# Patient Record
Sex: Female | Born: 1970 | Race: White | Hispanic: No | Marital: Single | Smoking: Current every day smoker
Health system: Southern US, Community
[De-identification: ages and names within clinical notes are randomized; demographics above are authoritative.]

## PROBLEM LIST (undated history)

## (undated) DIAGNOSIS — F319 Bipolar disorder, unspecified: Secondary | ICD-10-CM

## (undated) DIAGNOSIS — R011 Cardiac murmur, unspecified: Secondary | ICD-10-CM

## (undated) DIAGNOSIS — M545 Low back pain, unspecified: Secondary | ICD-10-CM

## (undated) DIAGNOSIS — E785 Hyperlipidemia, unspecified: Secondary | ICD-10-CM

## (undated) DIAGNOSIS — F419 Anxiety disorder, unspecified: Secondary | ICD-10-CM

## (undated) DIAGNOSIS — J189 Pneumonia, unspecified organism: Secondary | ICD-10-CM

## (undated) DIAGNOSIS — G8929 Other chronic pain: Secondary | ICD-10-CM

## (undated) DIAGNOSIS — T7840XA Allergy, unspecified, initial encounter: Secondary | ICD-10-CM

## (undated) DIAGNOSIS — J45909 Unspecified asthma, uncomplicated: Secondary | ICD-10-CM

## (undated) DIAGNOSIS — D649 Anemia, unspecified: Secondary | ICD-10-CM

## (undated) DIAGNOSIS — R002 Palpitations: Secondary | ICD-10-CM

## (undated) DIAGNOSIS — I10 Essential (primary) hypertension: Secondary | ICD-10-CM

## (undated) DIAGNOSIS — H269 Unspecified cataract: Secondary | ICD-10-CM

## (undated) DIAGNOSIS — F32A Depression, unspecified: Secondary | ICD-10-CM

## (undated) DIAGNOSIS — M797 Fibromyalgia: Secondary | ICD-10-CM

## (undated) DIAGNOSIS — K219 Gastro-esophageal reflux disease without esophagitis: Secondary | ICD-10-CM

## (undated) DIAGNOSIS — R519 Headache, unspecified: Secondary | ICD-10-CM

## (undated) DIAGNOSIS — N6310 Unspecified lump in the right breast, unspecified quadrant: Secondary | ICD-10-CM

## (undated) DIAGNOSIS — M5410 Radiculopathy, site unspecified: Secondary | ICD-10-CM

## (undated) DIAGNOSIS — M1990 Unspecified osteoarthritis, unspecified site: Secondary | ICD-10-CM

## (undated) DIAGNOSIS — M47812 Spondylosis without myelopathy or radiculopathy, cervical region: Secondary | ICD-10-CM

## (undated) DIAGNOSIS — F329 Major depressive disorder, single episode, unspecified: Secondary | ICD-10-CM

## (undated) DIAGNOSIS — IMO0001 Reserved for inherently not codable concepts without codable children: Secondary | ICD-10-CM

## (undated) DIAGNOSIS — G43909 Migraine, unspecified, not intractable, without status migrainosus: Secondary | ICD-10-CM

## (undated) DIAGNOSIS — R51 Headache: Secondary | ICD-10-CM

## (undated) HISTORY — DX: Bipolar disorder, unspecified: F31.9

## (undated) HISTORY — DX: Hyperlipidemia, unspecified: E78.5

## (undated) HISTORY — PX: NASAL SINUS SURGERY: SHX719

## (undated) HISTORY — DX: Gastro-esophageal reflux disease without esophagitis: K21.9

## (undated) HISTORY — DX: Essential (primary) hypertension: I10

## (undated) HISTORY — PX: UPPER GASTROINTESTINAL ENDOSCOPY: SHX188

## (undated) HISTORY — DX: Migraine, unspecified, not intractable, without status migrainosus: G43.909

## (undated) HISTORY — PX: COLONOSCOPY: SHX174

## (undated) HISTORY — DX: Depression, unspecified: F32.A

## (undated) HISTORY — PX: ABDOMINAL HYSTERECTOMY: SHX81

## (undated) HISTORY — PX: EYE SURGERY: SHX253

## (undated) HISTORY — DX: Reserved for inherently not codable concepts without codable children: IMO0001

## (undated) HISTORY — DX: Anxiety disorder, unspecified: F41.9

## (undated) HISTORY — DX: Unspecified osteoarthritis, unspecified site: M19.90

## (undated) HISTORY — DX: Anemia, unspecified: D64.9

## (undated) HISTORY — DX: Headache, unspecified: R51.9

## (undated) HISTORY — DX: Major depressive disorder, single episode, unspecified: F32.9

## (undated) HISTORY — PX: DENTAL RESTORATION/EXTRACTION WITH X-RAY: SHX5796

## (undated) HISTORY — DX: Allergy, unspecified, initial encounter: T78.40XA

## (undated) HISTORY — PX: OTHER SURGICAL HISTORY: SHX169

## (undated) HISTORY — DX: Low back pain: M54.5

## (undated) HISTORY — DX: Unspecified cataract: H26.9

## (undated) HISTORY — DX: Fibromyalgia: M79.7

## (undated) HISTORY — DX: Other chronic pain: G89.29

## (undated) HISTORY — DX: Radiculopathy, site unspecified: M54.10

## (undated) HISTORY — DX: Headache: R51

## (undated) HISTORY — DX: Spondylosis without myelopathy or radiculopathy, cervical region: M47.812

## (undated) HISTORY — DX: Low back pain, unspecified: M54.50

---

## 1999-04-10 ENCOUNTER — Encounter: Admission: RE | Admit: 1999-04-10 | Discharge: 1999-04-10 | Payer: Self-pay

## 1999-06-27 ENCOUNTER — Encounter (INDEPENDENT_AMBULATORY_CARE_PROVIDER_SITE_OTHER): Payer: Self-pay | Admitting: Specialist

## 1999-06-27 ENCOUNTER — Other Ambulatory Visit: Admission: RE | Admit: 1999-06-27 | Discharge: 1999-06-27 | Payer: Self-pay | Admitting: *Deleted

## 2000-12-17 ENCOUNTER — Encounter: Payer: Self-pay | Admitting: Emergency Medicine

## 2000-12-17 ENCOUNTER — Encounter: Payer: Self-pay | Admitting: Gastroenterology

## 2000-12-17 ENCOUNTER — Emergency Department (HOSPITAL_COMMUNITY): Admission: EM | Admit: 2000-12-17 | Discharge: 2000-12-17 | Payer: Self-pay | Admitting: Emergency Medicine

## 2000-12-24 ENCOUNTER — Ambulatory Visit (HOSPITAL_COMMUNITY): Admission: RE | Admit: 2000-12-24 | Discharge: 2000-12-24 | Payer: Self-pay | Admitting: *Deleted

## 2000-12-25 ENCOUNTER — Encounter: Payer: Self-pay | Admitting: *Deleted

## 2002-10-06 ENCOUNTER — Emergency Department (HOSPITAL_COMMUNITY): Admission: EM | Admit: 2002-10-06 | Discharge: 2002-10-06 | Payer: Self-pay | Admitting: Emergency Medicine

## 2002-11-25 ENCOUNTER — Encounter: Admission: RE | Admit: 2002-11-25 | Discharge: 2003-02-23 | Payer: Self-pay | Admitting: Radiology

## 2003-01-04 ENCOUNTER — Encounter
Admission: RE | Admit: 2003-01-04 | Discharge: 2003-02-09 | Payer: Self-pay | Admitting: Physical Medicine & Rehabilitation

## 2003-03-17 ENCOUNTER — Encounter
Admission: RE | Admit: 2003-03-17 | Discharge: 2003-06-15 | Payer: Self-pay | Admitting: Physical Medicine & Rehabilitation

## 2003-04-15 ENCOUNTER — Encounter: Payer: Self-pay | Admitting: Gastroenterology

## 2003-05-03 ENCOUNTER — Encounter: Payer: Self-pay | Admitting: Gastroenterology

## 2003-05-03 ENCOUNTER — Ambulatory Visit (HOSPITAL_COMMUNITY): Admission: RE | Admit: 2003-05-03 | Discharge: 2003-05-03 | Payer: Self-pay | Admitting: Gastroenterology

## 2003-05-03 ENCOUNTER — Encounter (INDEPENDENT_AMBULATORY_CARE_PROVIDER_SITE_OTHER): Payer: Self-pay

## 2003-05-17 ENCOUNTER — Encounter: Payer: Self-pay | Admitting: Gastroenterology

## 2003-05-17 ENCOUNTER — Ambulatory Visit (HOSPITAL_COMMUNITY): Admission: RE | Admit: 2003-05-17 | Discharge: 2003-05-17 | Payer: Self-pay | Admitting: Gastroenterology

## 2003-06-08 ENCOUNTER — Encounter: Payer: Self-pay | Admitting: Gastroenterology

## 2003-08-31 ENCOUNTER — Encounter
Admission: RE | Admit: 2003-08-31 | Discharge: 2003-11-29 | Payer: Self-pay | Admitting: Physical Medicine & Rehabilitation

## 2004-10-01 ENCOUNTER — Ambulatory Visit: Payer: Self-pay | Admitting: Gastroenterology

## 2005-03-19 ENCOUNTER — Ambulatory Visit: Payer: Self-pay | Admitting: Gastroenterology

## 2005-08-20 ENCOUNTER — Emergency Department (HOSPITAL_COMMUNITY): Admission: EM | Admit: 2005-08-20 | Discharge: 2005-08-20 | Payer: Self-pay | Admitting: Emergency Medicine

## 2006-04-22 ENCOUNTER — Ambulatory Visit: Payer: Self-pay | Admitting: Gastroenterology

## 2006-04-24 ENCOUNTER — Encounter (INDEPENDENT_AMBULATORY_CARE_PROVIDER_SITE_OTHER): Payer: Self-pay | Admitting: *Deleted

## 2006-04-24 ENCOUNTER — Ambulatory Visit: Payer: Self-pay | Admitting: Gastroenterology

## 2008-01-06 ENCOUNTER — Telehealth: Payer: Self-pay | Admitting: Gastroenterology

## 2008-01-06 ENCOUNTER — Ambulatory Visit: Payer: Self-pay | Admitting: Gastroenterology

## 2008-01-06 DIAGNOSIS — K5900 Constipation, unspecified: Secondary | ICD-10-CM | POA: Insufficient documentation

## 2008-01-06 DIAGNOSIS — K219 Gastro-esophageal reflux disease without esophagitis: Secondary | ICD-10-CM | POA: Insufficient documentation

## 2008-10-25 ENCOUNTER — Emergency Department (HOSPITAL_COMMUNITY): Admission: EM | Admit: 2008-10-25 | Discharge: 2008-10-26 | Payer: Self-pay | Admitting: Emergency Medicine

## 2008-11-03 ENCOUNTER — Ambulatory Visit (HOSPITAL_COMMUNITY): Admission: RE | Admit: 2008-11-03 | Discharge: 2008-11-03 | Payer: Self-pay | Admitting: General Practice

## 2009-01-16 ENCOUNTER — Encounter: Admission: RE | Admit: 2009-01-16 | Discharge: 2009-01-16 | Payer: Self-pay | Admitting: Neurology

## 2009-08-21 ENCOUNTER — Encounter: Payer: Self-pay | Admitting: Gastroenterology

## 2009-08-25 ENCOUNTER — Emergency Department (HOSPITAL_COMMUNITY): Admission: EM | Admit: 2009-08-25 | Discharge: 2009-08-25 | Payer: Self-pay | Admitting: Emergency Medicine

## 2010-01-16 ENCOUNTER — Encounter (INDEPENDENT_AMBULATORY_CARE_PROVIDER_SITE_OTHER): Payer: Self-pay | Admitting: *Deleted

## 2010-01-18 HISTORY — PX: TOTAL ABDOMINAL HYSTERECTOMY W/ BILATERAL SALPINGOOPHORECTOMY: SHX83

## 2010-01-23 ENCOUNTER — Encounter (INDEPENDENT_AMBULATORY_CARE_PROVIDER_SITE_OTHER): Payer: Self-pay | Admitting: *Deleted

## 2010-01-23 ENCOUNTER — Inpatient Hospital Stay (HOSPITAL_COMMUNITY): Admission: RE | Admit: 2010-01-23 | Discharge: 2010-01-25 | Payer: Self-pay | Admitting: Obstetrics and Gynecology

## 2010-01-23 ENCOUNTER — Encounter (INDEPENDENT_AMBULATORY_CARE_PROVIDER_SITE_OTHER): Payer: Self-pay | Admitting: Obstetrics and Gynecology

## 2010-01-24 ENCOUNTER — Encounter (INDEPENDENT_AMBULATORY_CARE_PROVIDER_SITE_OTHER): Payer: Self-pay | Admitting: *Deleted

## 2010-01-26 ENCOUNTER — Encounter (INDEPENDENT_AMBULATORY_CARE_PROVIDER_SITE_OTHER): Payer: Self-pay | Admitting: *Deleted

## 2010-03-12 ENCOUNTER — Encounter: Payer: Self-pay | Admitting: Gastroenterology

## 2010-04-18 ENCOUNTER — Ambulatory Visit: Payer: Self-pay | Admitting: Gastroenterology

## 2010-04-18 DIAGNOSIS — D649 Anemia, unspecified: Secondary | ICD-10-CM

## 2010-04-18 DIAGNOSIS — D509 Iron deficiency anemia, unspecified: Secondary | ICD-10-CM | POA: Insufficient documentation

## 2010-04-18 DIAGNOSIS — K921 Melena: Secondary | ICD-10-CM

## 2010-04-18 LAB — CONVERTED CEMR LAB
Basophils Absolute: 0.1 10*3/uL (ref 0.0–0.1)
Eosinophils Absolute: 0 10*3/uL (ref 0.0–0.7)
Folate: 9 ng/mL
Transferrin: 350.9 mg/dL (ref 212.0–360.0)
Vitamin B-12: >1500 pg/mL — ABNORMAL HIGH (ref 211–911)
WBC: 9.2 10*3/uL (ref 4.5–10.5)

## 2010-04-23 ENCOUNTER — Encounter (HOSPITAL_COMMUNITY)
Admission: RE | Admit: 2010-04-23 | Discharge: 2010-05-18 | Payer: Self-pay | Source: Home / Self Care | Attending: Gastroenterology | Admitting: Gastroenterology

## 2010-04-30 ENCOUNTER — Encounter: Payer: Self-pay | Admitting: Gastroenterology

## 2010-06-05 ENCOUNTER — Ambulatory Visit
Admission: RE | Admit: 2010-06-05 | Discharge: 2010-06-05 | Payer: Self-pay | Source: Home / Self Care | Attending: Gastroenterology | Admitting: Gastroenterology

## 2010-06-05 ENCOUNTER — Other Ambulatory Visit: Payer: Self-pay | Admitting: Gastroenterology

## 2010-06-05 LAB — CBC WITH DIFFERENTIAL/PLATELET
Basophils Absolute: 0 10*3/uL (ref 0.0–0.1)
Basophils Relative: 0.6 % (ref 0.0–3.0)
Eosinophils Absolute: 0.2 10*3/uL (ref 0.0–0.7)
Eosinophils Relative: 2.3 % (ref 0.0–5.0)
HCT: 35.2 % — ABNORMAL LOW (ref 36.0–46.0)
Hemoglobin: 12 g/dL (ref 12.0–15.0)
Lymphocytes Relative: 35.1 % (ref 12.0–46.0)
Lymphs Abs: 3.1 10*3/uL (ref 0.7–4.0)
MCHC: 34.2 g/dL (ref 30.0–36.0)
MCV: 95 fl (ref 78.0–100.0)
Monocytes Absolute: 0.5 10*3/uL (ref 0.1–1.0)
Monocytes Relative: 6 % (ref 3.0–12.0)
Neutro Abs: 4.9 10*3/uL (ref 1.4–7.7)
Neutrophils Relative %: 56 % (ref 43.0–77.0)
Platelets: 264 10*3/uL (ref 150.0–400.0)
RBC: 3.7 Mil/uL — ABNORMAL LOW (ref 3.87–5.11)
RDW: 25.2 % — ABNORMAL HIGH (ref 11.5–14.6)
WBC: 8.8 10*3/uL (ref 4.5–10.5)

## 2010-06-09 ENCOUNTER — Encounter: Payer: Self-pay | Admitting: Gastroenterology

## 2010-06-12 ENCOUNTER — Encounter: Payer: Self-pay | Admitting: Gastroenterology

## 2010-06-13 ENCOUNTER — Emergency Department (HOSPITAL_COMMUNITY)
Admission: EM | Admit: 2010-06-13 | Discharge: 2010-06-13 | Payer: Self-pay | Source: Home / Self Care | Admitting: Emergency Medicine

## 2010-06-19 ENCOUNTER — Encounter: Payer: Self-pay | Admitting: Gastroenterology

## 2010-06-19 NOTE — Letter (Signed)
Summary: [facility] Instructions  [facility]  [address] [place], [place] [address]   Phone: [phone]  Fax: [phone]       Connie Murray    [birth year]    MRN: [id number]        Procedure Day /Date: Tuesday January 17th, 2012     Arrival Time: 1:30pm     Procedure Time: 2:30pm     Location of Procedure:                    _ x_  [facility] (4th Floor)                        PREPARATION FOR COLONOSCOPY WITH MOVIPREP   Starting 5 days prior to your procedure1/12/12 do not eat nuts, seeds, popcorn, corn, beans, peas,  salads, or any raw vegetables.  Do not take any fiber supplements (e.g. Metamucil, Citrucel, and Benefiber).  THE DAY BEFORE YOUR PROCEDURE         DATE: 06/04/10  DAY: Monday  1.  Drink clear liquids the entire day-NO SOLID FOOD  2.  Do not drink anything colored red or purple.  Avoid juices with pulp.  No orange juice.  3.  Drink at least 64 oz. (8 glasses) of fluid/clear liquids during the day to prevent dehydration and help the prep work efficiently.  CLEAR LIQUIDS INCLUDE: Water Jello Ice Popsicles Tea (sugar ok, no milk/cream) Powdered fruit flavored drinks Coffee (sugar ok, no milk/cream) Gatorade Juice: apple, white grape, white cranberry  Lemonade Clear bullion, consomm, broth Carbonated beverages (any kind) Strained chicken noodle soup Hard Candy                             4.  In the morning, mix first dose of MoviPrep solution:    Empty 1 Pouch A and 1 Pouch B into the disposable container    Add lukewarm drinking water to the top line of the container. Mix to dissolve    Refrigerate (mixed solution should be used within 24 hrs)  5.  Begin drinking the prep at 5:00 p.m. The MoviPrep container is divided by 4 marks.   Every 15 minutes drink the solution down to the next mark (approximately 8 oz) until the full liter is complete.   6.  Follow completed prep with 16 oz of clear liquid of your choice  (Nothing red or purple).  Continue to drink clear liquids until bedtime.  7.  Before going to bed, mix second dose of MoviPrep solution:    Empty 1 Pouch A and 1 Pouch B into the disposable container    Add lukewarm drinking water to the top line of the container. Mix to dissolve    Refrigerate  THE DAY OF YOUR PROCEDURE      DATE: 06/05/10 DAY: Tuesday  Beginning at 9:30 a.m. (5 hours before procedure):         1. Every 15 minutes, drink the solution down to the next mark (approx 8 oz) until the full liter is complete.  2. Follow completed prep with 16 oz. of clear liquid of your choice.    3. You may drink clear liquids until 12:30pm (2 HOURS BEFORE PROCEDURE).   MEDICATION INSTRUCTIONS  Unless otherwise instructed, you should take regular prescription medications with a small sip of water   as early as possible the morning of your procedure.  OTHER INSTRUCTIONS  You will need a responsible adult at least 42 years of age to accompany you and drive you home.   This person must remain in the waiting room during your procedure.  Wear loose fitting clothing that is easily removed.  Leave jewelry and other valuables at home.  However, you may wish to bring a book to read or  an iPod/MP3 player to listen to music as you wait for your procedure to start.  Remove all body piercing jewelry and leave at home.  Total time from sign-in until discharge is approximately 2-3 hours.  You should go home directly after your procedure and rest.  You can resume normal activities the  day after your procedure.  The day of your procedure you should not:   Drive   Make legal decisions   Operate machinery   Drink alcohol   Return to work  You will receive specific instructions about eating, activities and medications before you leave.    The above instructions have been reviewed and explained to me by   Marchelle Folks.    I fully understand and can verbalize these instructions  _____________________________ Date _________

## 2010-06-19 NOTE — Discharge Summary (Signed)
Summary: Adenomyosis    NAME:  Connie Murray, Connie Murray                  ACCOUNT NO.:  [id number]      MEDICAL RECORD NO.:  [id number]          PATIENT TYPE:  INP      LOCATION:  9310                          FACILITY:  [facility]      PHYSICIAN:  Miguel Aschoff, M.D.       DATE OF BIRTH:  [birth year]      DATE OF ADMISSION:  01/23/2010   DATE OF DISCHARGE:  01/25/2010                                  DISCHARGE SUMMARY      ADMISSION DIAGNOSES:   1. Chronic pelvic pain.   2. Menorrhagia.      FINAL DIAGNOSIS:  Adenomyosis.      OPERATIONS AND PROCEDURES:   1. Total abdominal hysterectomy with bilateral salpingo-oophorectomy.   2. Removal of Norplant rods in the left arm.      COMPLICATIONS:  None.      JUSTIFICATION:  The patient is a 40 year old white female gravida 2,   para 1-0-1-1 who has history of pelvic pain, worsening dysmenorrhea and   menorrhagia associated with anemia.  The patient had been treated with   oral contraceptives, but did not find relief in the pain or menorrhagia,   and is now requested that a definitive procedure be carried out to   resolve the pain.  She was therefore admitted to the [facility] to   undergo hysterectomy and bilateral salpingo-oophorectomy and an effort   to resolve her dysmenorrhea and menorrhagia.  The risks and benefits of   the procedures were discussed with the patient and in view of her   history of severe chronic back pain, it was elected to proceed with   abdominal hysterectomy and not vaginal hysterectomy for fear that the   lithotomy position would worsen her chronic back problems, for which she   is already on multiple narcotic medications.      HOSPITAL COURSE:  Preoperative studies were obtained.  Her admission   hemoglobin was 10.3, hematocrit 31.5, white count 6200.  PT and PTT   within normal limits.  Chemistry profile revealed a glucose of 124, BUN   24, creatinine 1.4, albumin was 3.2.  Urinalysis was essentially   negative.  The  patient was found to be positive for MRSA on nasal swab   and was treated with hospital protocol.      HOSPITAL COURSE:  On January 23, 2010, under general anesthesia, total   abdominal hysterectomy was carried out without difficulty.  She was not   noted to have any remarkable findings in the pelvis at the time of   surgery, but again is a definitive procedure total abdominal   hysterectomy, bilateral salpingo-oophorectomy were carried out without   difficulty.  In addition, the patient had a Norplant system placed in   her left arm that has been there in place for approximately 15 years,   and she expressed desire to have this removed.  The implants were noted   to be placed very deeply in the arm rather than just subcutaneous tissue   and  because of this, only 3 of the Norplant rods were removed.  The   other 3 were felt to be so deep in the structure of the left upper arm,   and it was felt that an orthopedic surgeon should be consulted if the   patient did desire these final rods to be removed.  It was felt that   they could be safely left in place since they have already been in place   for 16 years.  The patient's postoperative course was essentially   uncomplicated.  She tolerated increased ambulation and diet and by the   second postoperative day, was in satisfactory condition and felt well   enough to be discharged home.  Medications for home included estradiol 1   mg one daily, Slow FE 1 or 2  tablets daily.  The patient was told to   resume all her other medications that she uses for   her chronic pain and other medical issues.  The patient will be seen   back in 4 weeks for followup examination.  She is to call for any   problems such as fever, pain, or heavy bleeding.  The pathology report   on the hysterectomy specimen revealed adenomyosis, weakly proliferative   endometrium.  The ovaries had no pathologic diagnosis.               Miguel Aschoff, M.D.             AR/MEDQ  D:  01/25/2010  T:  01/26/2010  Job:  [id number]      Electronically Signed by Miguel Aschoff M.D. on 01/31/2010 08:41:09 AM

## 2010-06-19 NOTE — Assessment & Plan Note (Addendum)
Summary: BLOOD COUNT WAS EIGHT BEFORE SHE HAD SURGERY/YF   History of Present Illness Visit Type: Follow-up Visit Primary GI MD: Elie Goody MD [facility] Primary Provider: Jacinto Reap MD Chief Complaint: anemia, hematochezia History of Present Illness:   This is a 40 year old female who has a progressively worsening anemia. Hemoglobin was 10.3, and April 2011 and decreased to 8.4 in September 2011 with normocytic indices. She reports receiving monthly B12 injections for the past several months. I do not have any records of iron or folate studies. B12 was normal at 485 in April, 2011.  She states she has been advised to take iron replacement several times in the past, however, they cause GI upset and discomfort.  She notes intermittent small-volume hematochezia, and problems with chronic constipation. She previously had a colonoscopy in December 2004 which was unremarkable.  There was a concern of Crohn's disease in 2002-2004 based on prior imaging studies, however, there was no endoscopic evidence of Crohn's disease and it was felt she that she does not have inflammatory bowel disease.   GI Review of Systems    Reports abdominal pain, acid reflux, dysphagia with solids, and  nausea.     Location of  Abdominal pain: lower abdomen.    Denies belching, bloating, chest pain, dysphagia with liquids, heartburn, loss of appetite, vomiting, vomiting blood, weight loss, and  weight gain.      Reports change in bowel habits, constipation, heme positive stool, and  rectal bleeding.     Denies anal fissure, black tarry stools, diarrhea, diverticulosis, fecal incontinence, hemorrhoids, irritable bowel syndrome, jaundice, light color stool, liver problems, and  rectal pain.   Current Medications (verified): 1)  Dexilant 60 Mg Cpdr (Dexlansoprazole) .... Take 1 Capsule By Mouth Once Daily 2)  Lasix 20 Mg  Tabs (Furosemide) .... Three Times A Day When Needed 3)  Methadose 10 Mg  Tabs (Methadone Hcl) ....  Qid 4)  Xanax 2 Mg  Tabs (Alprazolam) .... Qid 5)  Hydrocodone-Acetaminophen 10-325 Mg Tabs (Hydrocodone-Acetaminophen) .... As Needed 6)  Seroquel 200 Mg  Tabs (Quetiapine Fumarate) .Marland Kitchen.. 1-2 At Bedtime 7)  Flexeril 10 Mg  Tabs (Cyclobenzaprine Hcl) .Marland Kitchen.. 1 Three Times A Day 8)  Symbicort 160-4.5 Mcg/act  Aero (Budesonide-Formoterol Fumarate) .... 2 Puffs Two Times A Day 9)  Donnatal Extentabs  Cr-Tabs (Belladonna Alk-Phenobarbital) .... As Needed 10)  Miralax   Pack (Polyethylene Glycol 3350) .... Mix 17 Grams in 8 Oz of Water Once Daily 11)  Lexapro 20 Mg Tabs (Escitalopram Oxalate) .... At Bedtime 12)  Vytorin 10-20 Mg Tabs (Ezetimibe-Simvastatin) .... Once Daily 13)  Voltaren-Xr 100 Mg Xr24h-Tab (Diclofenac Sodium) .... Once Daily 14)  Cyanocobalamin 1000 Mcg/ml Soln (Cyanocobalamin) .... Monthly Injections  Allergies (verified): No Known Drug Allergies  Past History:  Past Medical History: Reviewed history from 01/05/2008 and no changes required. GERD Anxiety Disorder Bipolar disorder Chronic headaches Fibromyalgia Chronic lower back pain with disc disease Gastritis  Past Surgical History: TAH BSO, 01/2010  Family History: Reviewed history from 01/05/2008 and no changes required. Family History of Irritable Bowel Syndrome: Family History of Ovarian Cancer: Mother Alcoholism: Father  Social History: Reviewed history from 01/06/2008 and no changes required.   Alcohol Use - no Occupation: disabled Patient currently smokes. 1 pack per day Illicit Drug Use - no Patient gets regular exercise.  Review of Systems       The patient complains of allergy/sinus, anxiety-new, headaches-new, and muscle pains/cramps.         The  pertinent positives and negatives are noted as above and in the HPI. All other ROS were reviewed and were negative.   Vital Signs:  Patient profile:   40 year old female Height:      62 inches Weight:      140.38 pounds BMI:     25.77 Pulse  rate:   100 / minute Pulse rhythm:   regular BP sitting:   98 / 54  (left arm) Cuff size:   regular  Vitals Entered By: June McMurray CMA Duncan Dull) (April 18, 2010 11:36 AM)  Physical Exam  General:  Well developed, well nourished, no acute distress. Head:  Normocephalic and atraumatic. Eyes:  PERRLA, no icterus. Mouth:  No deformity or lesions, dentition normal. Lungs:  Clear throughout to auscultation. Heart:  Regular rate and rhythm; no murmurs, rubs,  or bruits. Abdomen:  Soft, nontender and nondistended. No masses, hepatosplenomegaly or hernias noted. Normal bowel sounds. Rectal:  deferred until time of colonoscopy.   Psych:  anxious and easily distracted.    Impression & Recommendations:  Problem # 1:  UNSPECIFIED ANEMIA (ICD-285.9) Worsening normocytic anemia without clear etiology. She reports that she has received monthly B12 shots for several months and her last B12 level was normal. Rule out iron or folate deficiency. Rule out other underlying hematologic problems.  She has had small volume hematochezia that is likely hemorrhoidal. Rule out colorectal neoplasms, AVMs. I have recommended propofol sedation, based on her medications and a prior history of difficulty achieving adequate sedation with fentanyl and Versed. The risks, benefits and alternatives to colonoscopy with possible biopsy and possible polypectomy were discussed with the patient and they consent to proceed. The procedure will be scheduled electively. She may need hematology referral for further evaluation I will defer this to her primary physician if needed. Orders: Colonoscopy (Colon) TLB-CBC Platelet - w/Differential (85025-CBCD) TLB-Iron, (Fe) Total (83540-FE) TLB-Ferritin (82728-FER) TLB-IBC Pnl (Iron/FE;Transferrin) (83550-IBC) TLB-Folic Acid (Folate) (82746-FOL) TLB-B12, Serum-Total ONLY (41324-M01)  Problem # 2:  BLOOD IN STOOL (ICD-578.1) As above. Orders: Colonoscopy (Colon)  Problem # 3:   CONSTIPATION (ICD-564.00) Increase MiraLax to 2 or 3 times daily. Attempt to decrease narcotic medications that are likely exacerbating her chronic constipation. Deferred to her physicians prescribing these medications  Patient Instructions: 1)  Get your labs drawn today in the basement.  2)  Increase Miralax 2-3 x daily.  3)  Colonoscopy brochure given.  4)  Copy sent to : Jacinto Reap, MD 5)  The medication list was reviewed and reconciled.  All changed / newly prescribed medications were explained.  A complete medication list was provided to the patient / caregiver.  Prescriptions: MOVIPREP 100 GM  SOLR (PEG-KCL-NACL-NASULF-NA ASC-C) As per prep instructions.  #1 x 0   Entered by:   Christie Nottingham CMA (AAMA)   Authorized by:   Meryl Dare MD [facility]   Signed by:   Christie Nottingham CMA (AAMA) on 04/18/2010   Method used:   Electronically to        [facility] (retail)       [address]       [place], [place]  [address]       Ph: [phone]       Fax: [phone]   RxID:   [id number] MOVIPREP 100 GM  SOLR (PEG-KCL-NACL-NASULF-NA ASC-C) As per prep instructions.  #1 x 0   Entered by:   Christie Nottingham CMA (AAMA)  Authorized by:   Meryl Dare MD [facility]   Signed by:   June McMurray CMA Duncan Dull) on 04/18/2010   Method used:   Electronically to        [facility] (retail)       [address]       [place], [place]  [address]       Ph: [phone]       Fax: [phone]   RxID:   [id number]   Appended Document: BLOOD COUNT WAS EIGHT BEFORE SHE HAD SURGERY/YF    Clinical Lists Changes  Medications: Added new medication of MOVIPREP 100 GM  SOLR (PEG-KCL-NACL-NASULF-NA ASC-C) As per prep instructions. - Signed Rx of MOVIPREP 100 GM  SOLR (PEG-KCL-NACL-NASULF-NA ASC-C) As per prep instructions.;  #1 x 0;  Signed;  Entered by: Christie Nottingham CMA (AAMA);  Authorized by: Meryl Dare MD [facility];  Method  used: Electronically to [facility] #[id number]*, [address] [place], [place]  [address], Ph: [phone], Fax: [phone]    Prescriptions: MOVIPREP 100 GM  SOLR (PEG-KCL-NACL-NASULF-NA ASC-C) As per prep instructions.  #1 x 0   Entered by:   Christie Nottingham CMA (AAMA)   Authorized by:   Meryl Dare MD [facility]   Signed by:   Christie Nottingham CMA (AAMA) on 05/31/2010   Method used:   Electronically to        [id number] (retail)       [address] [place], [place]  [address]       Ph: [phone]       Fax: [phone]   RxID:   [id number]

## 2010-06-21 NOTE — Op Note (Signed)
Summary: [facility]  [facility]   Imported By: Sherian Rein 05/04/2010 14:18:23  _____________________________________________________________________  External Attachment:    Type:   Image     Comment:   External Document

## 2010-06-21 NOTE — Letter (Signed)
Summary: Patient Notice- Colon Biospy Results  [facility]  [address] [place], [place] [address]   Phone: [phone]  Fax: [phone]        June 12, 2010 MRN: [id number]    Connie Murray [address] [place], [place]  [address]    Dear Ms. Mccaughan,  I am pleased to inform you that the biopsies taken during your recent colonoscopy did not show any evidence of cancer upon pathologic examination. The biopsies showed reactive inflammatory changes. There was no evidence on biopsy for ulcerative colitis or Crohn's disease.  Continue with the treatment plan as outlined on the day of your      exam.  Please call us if you are having persistent problems or have questions about your condition that have not been fully answered at this time.  Sincerely,  Meryl Dare MD [facility]  This letter has been electronically signed by your physician.  Appended Document: Patient Notice- Colon Biospy Results LETTER MAILED

## 2010-06-21 NOTE — Miscellaneous (Signed)
Summary: Orders Update  Clinical Lists Changes  Orders: Added new Test order of TLB-CBC Platelet - w/Differential (85025-CBCD) - Signed 

## 2010-06-21 NOTE — Procedures (Addendum)
Summary: Colonoscopy  Patient: Connie Murray Note: All result statuses are Final unless otherwise noted.  Tests: (1) Colonoscopy (COL)   COL Colonoscopy           DONE     [facility]     [address]     [place], [place]  [address]           COLONOSCOPY PROCEDURE REPORT           PATIENT:  Connie Murray, Connie Murray  MR#:  [id number]     BIRTHDATE:  [birth year], 39 yrs. old  GENDER:  female     ENDOSCOPIST:  Judie Petit T. Russella Dar, MD, [facility]           PROCEDURE DATE:  06/05/2010     PROCEDURE:  Colonoscopy with biopsy     ASA CLASS:  Class II     INDICATIONS:  1) Iron deficiency anemia  2) hematochezia  3)     constipation     MEDICATIONS:   propofol (Diprivan) 200 mg IV     DESCRIPTION OF PROCEDURE:   After the risks benefits and     alternatives of the procedure were thoroughly explained, informed     consent was obtained.  Digital rectal exam was performed and     revealed no abnormalities.   The LB [id number]-[id number] endoscope     was introduced through the anus and advanced to the terminal ileum     which was intubated for a short distance, limited by fair prep.     The quality of the prep was Moviprep fair.  The instrument was then     slowly withdrawn as the colon was fully examined.     <<PROCEDUREIMAGES>>     FINDINGS:  Inflammation was found at the ileocecal valve. It was     focal, erosive, benign appearing and friable. Multiple biopsies     were obtained and sent to pathology.  An ulcer was found at the     hepatic flexure. It was erythematous and benign appaering. It was     4 mm in size. Multiple biopsies were obtained and sent to     pathology.  The terminal ileum appeared normal. Random biopsies     were obtained and sent to pathology.  A normal appearing cecum and     appendiceal orifice were identified. The ascending, transverse,     splenic flexure, descending, sigmoid colon, and rectum appeared     unremarkable. Retroflexed views in the rectum revealed no  abnormalities. The time to cecum =  5  minutes. The scope was then     withdrawn (time =  14.5  min) from the patient and the procedure     completed.     COMPLICATIONS:  None           ENDOSCOPIC IMPRESSION:     1) Inflammation at the ileocecal valve     2) 4 mm ulcer at the hepatic flexure     3) Normal terminal ileum           RECOMMENDATIONS:     1) Await pathology results     2) Repeat Colonscopy in 10 years for routine CRC screening     pending pathology review           Rilea Arutyunyan T. Russella Dar, MD, [facility]           CC: Jacinto Reap, MD           n.  eSIGNED:   Mackena Plummer T. Eyal Greenhaw at 06/05/2010 02:46 PM           [place], [place], [phone]  Note: An exclamation mark (!) indicates a result that was not dispersed into the flowsheet. Document Creation Date: 06/05/2010 2:46 PM _______________________________________________________________________  (1) Order result status: Final Collection or observation date-time: 06/05/2010 14:39 Requested date-time:  Receipt date-time:  Reported date-time:  Referring Physician:   Ordering Physician: Claudette Head [phone]) Specimen Source:  Source: Launa Grill Order Number: [id number] Lab site:   Appended Document: Colonoscopy     Procedures Next Due Date:    Colonoscopy: 05/2020

## 2010-07-11 NOTE — Letter (Signed)
Summary: Sherri Rad MD/[facility]  Sherri Rad MD/[facility]   Imported By: Lester Bergman 07/03/2010 08:36:46  _____________________________________________________________________  External Attachment:    Type:   Image     Comment:   External Document

## 2010-08-02 LAB — CBC
MCH: 28.2 pg (ref 26.0–34.0)
MCV: 86.5 fL (ref 78.0–100.0)
Platelets: 293 10*3/uL (ref 150–400)
RDW: 19.5 % — ABNORMAL HIGH (ref 11.5–15.5)
WBC: 10.5 10*3/uL (ref 4.0–10.5)

## 2010-08-02 LAB — HEMOGLOBIN AND HEMATOCRIT, BLOOD: Hemoglobin: 9.5 g/dL — ABNORMAL LOW (ref 12.0–15.0)

## 2010-08-03 LAB — URINALYSIS, ROUTINE W REFLEX MICROSCOPIC
Bilirubin Urine: NEGATIVE
Hgb urine dipstick: NEGATIVE
Ketones, ur: NEGATIVE mg/dL
Protein, ur: NEGATIVE mg/dL
Urobilinogen, UA: 0.2 mg/dL (ref 0.0–1.0)

## 2010-08-03 LAB — COMPREHENSIVE METABOLIC PANEL
ALT: 25 U/L (ref 0–35)
AST: 24 U/L (ref 0–37)
Alkaline Phosphatase: 68 U/L (ref 39–117)
BUN: 24 mg/dL — ABNORMAL HIGH (ref 6–23)
GFR calc Af Amer: 50 mL/min — ABNORMAL LOW (ref >60)
GFR calc non Af Amer: 41 mL/min — ABNORMAL LOW (ref >60)
Sodium: 138 mEq/L (ref 135–145)

## 2010-08-03 LAB — CBC
HCT: 31.5 % — ABNORMAL LOW (ref 36.0–46.0)
Hemoglobin: 10.3 g/dL — ABNORMAL LOW (ref 12.0–15.0)
RBC: 3.68 MIL/uL — ABNORMAL LOW (ref 3.87–5.11)
WBC: 6.2 10*3/uL (ref 4.0–10.5)

## 2010-08-03 LAB — SURGICAL PCR SCREEN: Staphylococcus aureus: POSITIVE — AB

## 2010-08-03 LAB — PROTIME-INR: INR: 0.93 (ref 0.00–1.49)

## 2010-08-03 LAB — APTT: aPTT: 31 seconds (ref 24–37)

## 2010-08-08 LAB — DIFFERENTIAL
Basophils Absolute: 0.1 K/uL (ref 0.0–0.1)
Basophils Relative: 1 % (ref 0–1)
Eosinophils Absolute: 0 K/uL (ref 0.0–0.7)
Eosinophils Relative: 0 % (ref 0–5)
Lymphocytes Relative: 34 % (ref 12–46)
Lymphs Abs: 2.5 K/uL (ref 0.7–4.0)
Monocytes Absolute: 0.5 K/uL (ref 0.1–1.0)
Monocytes Relative: 7 % (ref 3–12)
Neutro Abs: 4.3 K/uL (ref 1.7–7.7)
Neutrophils Relative %: 59 % (ref 43–77)

## 2010-08-08 LAB — CBC
Hemoglobin: 10.5 g/dL — ABNORMAL LOW (ref 12.0–15.0)
MCV: 89.4 fL (ref 78.0–100.0)
RBC: 3.53 MIL/uL — ABNORMAL LOW (ref 3.87–5.11)
WBC: 7.4 10*3/uL (ref 4.0–10.5)

## 2010-08-08 LAB — BASIC METABOLIC PANEL
GFR calc Af Amer: 35 mL/min — ABNORMAL LOW (ref >60)
Glucose, Bld: 99 mg/dL (ref 70–99)
Potassium: 3.3 mEq/L — ABNORMAL LOW (ref 3.5–5.1)

## 2010-08-08 LAB — PROTIME-INR: Prothrombin Time: 13.2 seconds (ref 11.6–15.2)

## 2010-10-05 NOTE — Assessment & Plan Note (Signed)
MEDICAL RECORD NUMBER:  [id number].   Connie Murray is here on November 01, 2003 regarding her fibromyalgia and ongoing low  back pain. The patient has been fairly stable from her last visit. She has  been having pain at a 2 to 4/10 level generally. She did well with the  lateral triceps injection we performed the last visit. She has been doing  her plantar fascia stretches with some relief. She has had ______________.  She is not always using appropriate shoe wear. She was active yesterday and  had some foot pain as a result of going up multiple flights of steps.  Methadone remained at 10 mg t.i.d., and she is using oxycodone 0 to 3 a day.  Midrin works nicely for her periodic headaches. The patient is able to do  activities around the house including general upkeep. She is able to get out  of the house. Generally, the patient is made worse with activity, but she  seems to be able to tolerate it with extra time and rest. The patient has  noticed some occasional sternal pain, pain in the right buttock, and  tailbone area, which is transient. She has received some Flexeril from her  local physician which seemed to be helpful.   REVIEW OF SYSTEMS:  On review of systems, the patient denies any chest pain.  She has had some occasional shortness of breath and abnormal heart rhythm.  She denies any cold, flu, wheezing, or coughing. She does report weakness,  numbness, dizziness, spasms, vertigo, confusion, blurred vision, anxiety,  depression, problems with sleep, agitation, and occasional headaches. Does  report nausea, vomiting, reflux, constipation, abdominal pain, and appetite  problems. She does report some weight loss, swelling, and excessive sweating   PHYSICAL EXAMINATION:  On physical examination today, blood pressure is  123/70, pulse 95, respiratory rate 22. She is saturating 98% on room air.  The patient walks with fairly normal gait and bright affect. General  appearance is normal. The patient  had minimal pain with impingement  maneuvers and rotation of bilateral shoulders. Posture still was a bit in  the head forward position but was improved from prior examinations.  Spurling's test was negative. Upper extremity strength was 5/5 today. On  examination of the low back, she had fair posture. There was some pain in  the right SI joint region as well as the proximal hamstring. She is able to  flex with really minimal discomfort today to about 85 to 90 degrees.  Extension was 25 degrees with minimal pain. Motor and sensory function was  within normal limits in both legs. I examined both feet, and patient had  minimal tenderness along the medial arches and with forced ankle dorsi  flexion. There was no pain with plantar flexion, eversion, inversion of  either foot.   ASSESSMENT:  1. Myofascial pain and fibromyalgia which is stable and improved if     anything.  2. Bilateral shoulder pain, improved post triceps tendon injection.  3. Anxiety/depression.  4. Plantar fasciitis bilaterally.  5. History of right SI joint irritation which is stable to improved.   PLAN:  1. The patient will continue with her general exercise program. She will     need to maintain this, I believe, to preserve her functional and physical     status.  2. I refilled methadone 10 mg t.i.d. yesterday, #90. She also received OxyIR     5 mg one p.o. b.i.d. p.r.n. #60 yesterday.  3. I emphasized the importance  of proper shoe wear.  4. The patient may continue with her Flexeril scheduled to eventually p.r.n.     basis.  5. I will see the patient back in three months' time. She may come back in a     month for refill of her medication.      Ranelle Oyster, M.D.   ZTS/MedQ  D:  11/01/2003 16:14:20  T:  11/01/2003 16:51:06  Job #:  [id number]   cc:   Jacinto Reap, M.D.

## 2010-10-05 NOTE — Assessment & Plan Note (Signed)
SUBJECTIVE:  Connie Murray is back regarding her cervical pain and fibromyalgia as  well as her SI joint pain.  She has had much improvement in her low back  pain.  At this point, there has been no real reoccurrence at this time.  She  had good results at the trigger point injections and suboccipital regions  and has really had minimal headaches.  She is using the Midrin rarely at  this point.  She is still having some tenderness in the lower cervical area  and trapezius regions, but this is not severe.  Her biggest complaint is  numbness in the left arm with some associated pain.  This seems to be  happening more when she is at sleep at night and waking in the morning than  anything else.  She notes no focal weakness of the right upper extremity.   Connie Murray reports that the pain is 3/10 today.  It is running from 1-5/10.  She  is taking Methadone 10 mg t.i.d. and using Oxycodone one q.12h. p.r.n.  She  may use 0-4 tablets on a daily basis.  She states she has gone several days  at a time without using anything except for Tylenol and ibuprofen.  She was  diagnosed with some sort of colitis and she is on new bowel medicines for  spasms apparently.   REVIEW OF SYSTEMS:  The patient denies new bladder complaints.  She is  sleeping well.  Mood is much improved.  No chest pain or shortness of breath  is noted.  No reflux.  Other pertinent positives listed above.   PHYSICAL EXAMINATION:  GENERAL APPEARANCE:  The patient is pleasant, no  acute distress.  VITAL SIGNS:  Blood pressure 106/76, pulse 106, saturation 98% on room air.  NECK:  On palpation, she had minimal tenderness in the suboccipital  musculature.  She had some tenderness in the mid trapezius and greater  scapula muscles.  Shoulders were nontender.  Negative for impingement in the  shoulders.  Spurling test was negative.  Reflexes were essentially 1+ to 2+  throughout with no focal sensory loss nor discrepancies in motor function in  the  upper or lower extremities.  She had excellent cervical range of motion  as well as lumbar range of motion today.  She is able to bend forward at the  lumbar spine without significant pain today.   ASSESSMENT:  1. Right sacroiliac joint inflammation improved.  2. Cervicalgia and myofascial pain with headaches which seems to be much     improved as well.  3. Fibromyalgia.  4. Left arm numbness.  5. Anxiety and depression.   PLAN:  1. Will continue with Methasone 10 mg t.i.d.  She has a prescription from     May 30, 2003.  She will also use Oxycodone 5 mg for breakthrough     pain.  We will try to taper these medications further.  She has done very     nicely over the last 2-3 months with her pain management, and hopefully,     we can further titrate these medications downward.  2. I encouraged overall increased activity and exercise.  She needs to get     herself more on a scheduled routine.  It may help her to document her     repetitions and time spent for exercises so that she can expound upon     these further.  3. Will try to obtain cervical spine films from [facility] to review.  If     there is anything suspicious in the left upper to mid cervical regions,     we may consider follow up scan before she returns back to the clinic.  4. I will see her back in approximately three months time.    Ranelle Oyster, M.D.   ZTS/MedQ  D:  06/03/2003 10:07:02  T:  06/03/2003 11:05:00  Job #:  [id number]   cc:   Jacinto Reap, M.D.

## 2010-10-05 NOTE — Assessment & Plan Note (Signed)
Sherrin is back regarding her fibromyalgia as well as cervical low back pain.  Have not received her MRI reports of her cervical spine; however, Jayel  states that she has done better with her pain from a general standpoint. She  has tried to remain active. She is doing some daily exercises and  stretching. She feels that today her most painful areas are her right and  left shoulders. The left one bothers her more so than the right. The low  back is painful at times as well as the feet and hips. We discussed her foot  pain and stated that the feet hurt at the heel and through the arches.  Generally got better with time and walking and were worse in the morning  when she arose. Occasional numbness of the feet noted. Shoulders were  painful from the supraclavicular fossa down to the anterolateral elbow and  lateral epicondyle.   She rates her pain as a 5/10 on average. She is using methadone 10 mg t.i.d.  for pain as well as oxycodone 1 to 2 q.12h. p.r.n. in a sporadic fashion.  She reports no new inciting factors to her pain.   REVIEW OF SYSTEMS:  On review of systems, the patient denies any chest pain,  wheezing, coughing, cold or flu symptoms. She has had no problems with  confusion, weakness, numbness, dizziness, blurred vision, anxiety,  headaches. She has had no nausea, vomiting, dysuria, bladder incontinence.  She has had some intermittent constipation and bowel difficulties and was  diagnosed recently with Crohn's disease. She is now taking hyoscyamine for  this. The patient denies any skin changes, fever, or chills.   PHYSICAL EXAMINATION:  Her blood pressure is 109/60, pulse is 110. She is  saturating 97% on room air. The patient's gait is fairly normal. Affect is  alert and appropriate. Appearance is normal.   On physical exam today, the left shoulder was slightly painful to palpation  on both biceps tendons. The lateral triceps tendon was tender to palpation  today and seemed  to recreate her pain with palpation and with resisted  extension of the arm. Impingement maneuver was equivocal. Spurling's test  was negative. Right shoulder slightly tender in similarities but to a much  lesser extent. The patient had palpatory tenderness in both talar areas of  the foot. Plantar fascia were also tender with palpation and with stretching  today. No obvious ankle or foot instability was noted. Strength in bilateral  feet was 5/5 with ankle dorsi flexion, plantar flexion, eversion, and  inversion. Lower extremity reflexes were 2+. Sensory exam was intact  throughout. Low back exam remains essentially unchanged, no obvious  discrepancies in the lumbar spine. She is able to bend forward and back  without significant discomfort. Cervical spine exam was essentially benign  today.   ASSESSMENT:  1. Myofascial pain and fibromyalgia which has improved with activity.  2. Bilateral shoulder pain, left greater than right, which may be consistent     with triceps tendon irritation.  3. History of anxiety and depression.  4. Plantar fasciitis bilaterally.  5. History of right SI joint irritation which is improved.   PLAN:  1. She will continue with her aggressive activity program. We discussed     decreasing some of her shoulder exercises temporarily until the triceps     muscle has recovered. In the interim, we injected around the lateral     triceps head today with 3 cc of 1% lidocaine and 40 mg  Kenalog. The     patient tolerated this well and had relief with this injection today.  2. We discussed stretching exercises for her bilateral plantar fascia. She     may benefit from bilateral heel cups. We also discussed ice therapy.  3. Refill methadone and oxycodone on April 7 at 90 tablets and 60 tablets     respectively and will keep with the     same dose for now.  4. Will see the patient back in approximately two months' time.      Ranelle Oyster, M.D.   ZTS/MedQ  D:   09/02/2003 14:52:06  T:  09/02/2003 16:24:39  Job #:  [id number]

## 2010-10-05 NOTE — Assessment & Plan Note (Signed)
[facility]                         GASTROENTEROLOGY OFFICE NOTE   Connie Murray, Connie Murray                         MRN:          [id number]  DATE:04/22/2006                            DOB:          04-27-1971    Mrs. Raybuck returns for problems with burning substernal pain associated  with burning in her throat and occasional solid and liquid dysphagia.  She has noted some vocal changes with easy voice fatigue and difficulty  in sustaining a high vocal volume. She does have some mild abdominal  bloating and has noted occasional small amounts of bright red blood per  rectum. She has had suspected ileocecal Crohn's disease in the past but  it has not been confirmed by biopsy. She has been treated for this. She  has noted weight gain mainly in her abdomen over the past several months  since taking Seroquel. She takes Prilosec twice a day and has been on  this regimen for some time. She uses MiraLax frequently for management  of constipation. No odynphagia noted.   MEDICATION ALLERGIES:  NONE KNOWN.   CURRENT MEDICATIONS:  Listed on the chart as being reviewed.   PHYSICAL EXAMINATION:  In no acute distress, weight 171 pounds, blood  pressure 120/80, pulse 82 and regular.  HEENT: Anicteric sclera, oral pharynx clear.  NECK: Without thyromegaly or adenopathy appreciated.  CHEST: Clear to auscultation bilaterally.  CARDIOVASCULAR: Regular rate and rhythm without murmurs.  ABDOMEN: Soft nontender, nondistended, normoactive bowel sounds, no  palpable organomegaly, masses or hernias.  RECTAL EXAMINATION: Deferred.  NEUROLOGICAL: Alert and oriented times 3, grossly nonfocal.   ASSESSMENT/PLAN:  Gastrointestinal reflux disease with suspected  worsening symptoms leading to throat burning and vocal changes. I have  recommended  she return to her ENT physician for further evaluation of  her throat and vocal symptoms. Discontinue Prilosec and begin Aciphex 20  mg p.o.  bid along with all standard anti-reflux measures. Risks,  benefits and alternatives to upper endoscopy with possible biopsy and  possible dilation was discussed with the patient. If she consents to  proceed, this will be scheduled electively.     Venita Lick. Russella Dar, MD, [facility]  Electronically Signed    MTS/MedQ  DD: 04/22/2006  DT: 04/23/2006  Job #: [id number]

## 2010-11-19 ENCOUNTER — Other Ambulatory Visit: Payer: Self-pay | Admitting: Neurology

## 2010-11-19 DIAGNOSIS — R51 Headache: Secondary | ICD-10-CM

## 2010-11-19 DIAGNOSIS — G542 Cervical root disorders, not elsewhere classified: Secondary | ICD-10-CM

## 2010-12-01 ENCOUNTER — Ambulatory Visit
Admission: RE | Admit: 2010-12-01 | Discharge: 2010-12-01 | Disposition: A | Discharge status: Home / Self Care | Payer: Medicaid Other | Source: Ambulatory Visit | Attending: Neurology | Admitting: Neurology

## 2010-12-01 DIAGNOSIS — R51 Headache: Secondary | ICD-10-CM

## 2010-12-01 DIAGNOSIS — G542 Cervical root disorders, not elsewhere classified: Secondary | ICD-10-CM

## 2011-08-29 ENCOUNTER — Other Ambulatory Visit: Payer: Self-pay | Admitting: Obstetrics and Gynecology

## 2011-09-03 ENCOUNTER — Other Ambulatory Visit: Payer: Self-pay | Admitting: Obstetrics and Gynecology

## 2011-09-03 DIAGNOSIS — R928 Other abnormal and inconclusive findings on diagnostic imaging of breast: Secondary | ICD-10-CM

## 2011-09-04 ENCOUNTER — Ambulatory Visit
Admission: RE | Admit: 2011-09-04 | Discharge: 2011-09-04 | Disposition: A | Discharge status: Home / Self Care | Payer: Medicaid Other | Source: Ambulatory Visit | Attending: Obstetrics and Gynecology | Admitting: Obstetrics and Gynecology

## 2011-09-04 DIAGNOSIS — R928 Other abnormal and inconclusive findings on diagnostic imaging of breast: Secondary | ICD-10-CM

## 2011-10-08 ENCOUNTER — Other Ambulatory Visit: Payer: Self-pay | Admitting: Neurology

## 2011-10-08 DIAGNOSIS — M797 Fibromyalgia: Secondary | ICD-10-CM

## 2011-10-08 DIAGNOSIS — R51 Headache: Secondary | ICD-10-CM

## 2011-10-08 DIAGNOSIS — G542 Cervical root disorders, not elsewhere classified: Secondary | ICD-10-CM

## 2011-10-09 ENCOUNTER — Ambulatory Visit
Admission: RE | Admit: 2011-10-09 | Discharge: 2011-10-09 | Disposition: A | Discharge status: Home / Self Care | Payer: Medicare Other | Source: Ambulatory Visit | Attending: Neurology | Admitting: Neurology

## 2011-10-09 VITALS — BP 96/58 | HR 79

## 2011-10-09 DIAGNOSIS — G542 Cervical root disorders, not elsewhere classified: Secondary | ICD-10-CM

## 2011-10-09 DIAGNOSIS — M797 Fibromyalgia: Secondary | ICD-10-CM

## 2011-10-09 DIAGNOSIS — R51 Headache: Secondary | ICD-10-CM

## 2011-10-09 MED ORDER — TRIAMCINOLONE ACETONIDE 40 MG/ML IJ SUSP (RADIOLOGY)
60.0000 mg | Freq: Once | INTRAMUSCULAR | Status: AC
  Administered 2011-10-09: 60 mg via EPIDURAL

## 2011-10-09 MED ORDER — IOHEXOL 300 MG/ML  SOLN
1.0000 mL | Freq: Once | INTRAMUSCULAR | Status: AC | PRN
  Administered 2011-10-09: 1 mL via EPIDURAL

## 2011-10-09 NOTE — Discharge Instructions (Signed)

## 2011-10-27 ENCOUNTER — Ambulatory Visit (INDEPENDENT_AMBULATORY_CARE_PROVIDER_SITE_OTHER): Payer: Medicare Other | Admitting: Family Medicine

## 2011-10-27 VITALS — BP 126/75 | HR 96 | Temp 98.8°F | Resp 24 | Ht 61.0 in | Wt 138.2 lb

## 2011-10-27 DIAGNOSIS — L0291 Cutaneous abscess, unspecified: Secondary | ICD-10-CM

## 2011-10-27 DIAGNOSIS — F411 Generalized anxiety disorder: Secondary | ICD-10-CM

## 2011-10-27 DIAGNOSIS — L0390 Cellulitis, unspecified: Secondary | ICD-10-CM

## 2011-10-27 DIAGNOSIS — R0609 Other forms of dyspnea: Secondary | ICD-10-CM

## 2011-10-27 DIAGNOSIS — F419 Anxiety disorder, unspecified: Secondary | ICD-10-CM

## 2011-10-27 DIAGNOSIS — R0600 Dyspnea, unspecified: Secondary | ICD-10-CM

## 2011-10-27 DIAGNOSIS — R0989 Other specified symptoms and signs involving the circulatory and respiratory systems: Secondary | ICD-10-CM

## 2011-10-27 DIAGNOSIS — Z77120 Contact with and (suspected) exposure to mold (toxic): Secondary | ICD-10-CM

## 2011-10-27 MED ORDER — AMOXICILLIN-POT CLAVULANATE 875-125 MG PO TABS: 1.0000 | ORAL_TABLET | Freq: Two times a day (BID) | ORAL | Status: AC

## 2011-10-27 NOTE — Progress Notes (Signed)
Subjective:  41 year old lady with a complicated medical history. She has a long history of her disease, has had asthma, smokes a has had bipolar disease, and is very anxious. 13 years ago she got into some black mold in the back her closet and apparently had a possible allergic reaction. She is carefully avoided it ever since. She was cleaning out her old house for her mother and picked up a stuffed animal that had extensive black mold on the backside of it. She got her arm. She washed extensively with went once and then went across the street and used to neighbors water to wash it off. She aren't he had a couple of little scratches from her cath on the she then felt like she had trouble breathing. She called her doctor in [place] who prescribed a dose pack for her. She's not been able to sleep at night, tossing and turning. She has dreams. She took a Xanax and that helped a little bit. She now has some red splotches on her right arm.  Objective: To read patches on the right forearm, one approximately 6 or 7 cm in diameter and the other 3 or 4 cm. The larger one has a scratch on it. Both halves of a central punctate mark like a puncture wound or sting spot. No axillary nodes. Chest is clear. Smells of cigarette smoke. Heart regular without murmurs. No skin lesions are noted. Her peak flow was more than excellent, at 470.  Assessment: Cellulitis, possibly secondary to cat scratch or bite, possibly from some kind of a sting, doubtful mold allergy Dyspnea, secondary to anxiety Anxiety  Plan: Augmentin Continue her prednisone return if worse

## 2011-10-27 NOTE — Patient Instructions (Signed)
Continue the prednisone Return if worse  Watch carefully for increasing redness.

## 2012-02-19 ENCOUNTER — Other Ambulatory Visit: Payer: Self-pay | Admitting: Neurology

## 2012-02-19 DIAGNOSIS — M47812 Spondylosis without myelopathy or radiculopathy, cervical region: Secondary | ICD-10-CM

## 2012-02-26 ENCOUNTER — Ambulatory Visit
Admission: RE | Admit: 2012-02-26 | Discharge: 2012-02-26 | Disposition: A | Discharge status: Home / Self Care | Payer: Medicare Other | Source: Ambulatory Visit | Attending: Neurology | Admitting: Neurology

## 2012-02-26 DIAGNOSIS — M47812 Spondylosis without myelopathy or radiculopathy, cervical region: Secondary | ICD-10-CM

## 2012-02-26 MED ORDER — IOHEXOL 300 MG/ML  SOLN
1.0000 mL | Freq: Once | INTRAMUSCULAR | Status: AC | PRN
  Administered 2012-02-26: 1 mL via EPIDURAL

## 2012-02-26 MED ORDER — TRIAMCINOLONE ACETONIDE 40 MG/ML IJ SUSP (RADIOLOGY)
60.0000 mg | Freq: Once | INTRAMUSCULAR | Status: AC
  Administered 2012-02-26: 60 mg via EPIDURAL

## 2012-06-17 ENCOUNTER — Telehealth: Payer: Self-pay | Admitting: Gastroenterology

## 2012-06-17 NOTE — Telephone Encounter (Signed)
Patient reports worsening abdominal pain and constipation.  She reports that the pain started a few weeks ago after her aunts funeral. She reports that she is very constipated.  She takes an herbal tea instead of Miralax.  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  She has some blood streaks in her stool.  She wants to see Dr. Russella Dar only.  She will come in 06/29/12, she will call back if she wants to see an extender in the interim.

## 2012-06-29 ENCOUNTER — Other Ambulatory Visit (INDEPENDENT_AMBULATORY_CARE_PROVIDER_SITE_OTHER): Payer: Medicare Other

## 2012-06-29 ENCOUNTER — Encounter: Payer: Self-pay | Admitting: Gastroenterology

## 2012-06-29 ENCOUNTER — Ambulatory Visit (INDEPENDENT_AMBULATORY_CARE_PROVIDER_SITE_OTHER): Payer: Medicare Other | Admitting: Gastroenterology

## 2012-06-29 ENCOUNTER — Other Ambulatory Visit: Payer: Self-pay

## 2012-06-29 VITALS — BP 90/60 | HR 76 | Ht 61.0 in | Wt 136.2 lb

## 2012-06-29 DIAGNOSIS — R109 Unspecified abdominal pain: Secondary | ICD-10-CM

## 2012-06-29 DIAGNOSIS — K5900 Constipation, unspecified: Secondary | ICD-10-CM

## 2012-06-29 LAB — CBC WITH DIFFERENTIAL/PLATELET
Basophils Absolute: 0 10*3/uL (ref 0.0–0.1)
Lymphocytes Relative: 30.5 % (ref 12.0–46.0)
Lymphs Abs: 3.5 10*3/uL (ref 0.7–4.0)
Monocytes Relative: 6.9 % (ref 3.0–12.0)
Platelets: 287 10*3/uL (ref 150.0–400.0)
RDW: 14.1 % (ref 11.5–14.6)

## 2012-06-29 LAB — COMPREHENSIVE METABOLIC PANEL
ALT: 38 U/L — ABNORMAL HIGH (ref 0–35)
AST: 56 U/L — ABNORMAL HIGH (ref 0–37)
CO2: 30 mEq/L (ref 19–32)
Chloride: 100 mEq/L (ref 96–112)
GFR: 48.57 mL/min — ABNORMAL LOW (ref >60.00)
Sodium: 140 mEq/L (ref 135–145)
Total Bilirubin: 0.5 mg/dL (ref 0.3–1.2)
Total Protein: 7.5 g/dL (ref 6.0–8.3)

## 2012-06-29 NOTE — Patient Instructions (Addendum)
Your physician has requested that you go to the basement for the following lab work before leaving today: CBC, Cmet.   You have been given a separate informational sheet regarding your tobacco use, the importance of quitting and local resources to help you quit.   You have been scheduled for a CT scan of the abdomen and pelvis at [facility] ([address]---this is in the same building as [facility]).   You are scheduled on 06/30/12 at 9:30am. You should arrive 15 minutes prior to your appointment time for registration. Please follow the written instructions below on the day of your exam:  WARNING: IF YOU ARE ALLERGIC TO IODINE/X-RAY DYE, PLEASE NOTIFY RADIOLOGY IMMEDIATELY AT [phone]! YOU WILL BE GIVEN A 13 HOUR PREMEDICATION PREP.  1) Do not eat or drink anything after 5:30am (4 hours prior to your test) 2) You have been given 2 bottles of oral contrast to drink. The solution may taste better if refrigerated, but do NOT add ice or any other liquid to this solution. Shake well before drinking.    Drink 1 bottle of contrast @ 7:30am (2 hours prior to your exam)  Drink 1 bottle of contrast @ 8:30am (1 hour prior to your exam)  You may take any medications as prescribed with a small amount of water except for the following: Metformin, Glucophage, Glucovance, Avandamet, Riomet, Fortamet, Actoplus Met, Janumet, Glumetza or Metaglip. The above medications must be held the day of the exam AND 48 hours after the exam.  The purpose of you drinking the oral contrast is to aid in the visualization of your intestinal tract. The contrast solution may cause some diarrhea. Before your exam is started, you will be given a small amount of fluid to drink. Depending on your individual set of symptoms, you may also receive an intravenous injection of x-ray contrast/dye. Plan on being at [facility] for 30 minutes or long, depending on the type of exam you are having  performed.  If you have any questions regarding your exam or if you need to reschedule, you may call the CT department at [phone] between the hours of 8:00 am and 5:00 pm, Monday-Friday.   cc: Althea Charon, MD

## 2012-06-29 NOTE — Progress Notes (Signed)
History of Present Illness: This is a 42 year old female with chronic constipation which is currently well-controlled on an herbal laxative tea. She currently does not have any straining or difficulty passing bowel movements. She occasionally notes a small amount of bright red blood on the tissue paper with a bowel movement. She underwent colonoscopy in 2012 which showed small benign ulcerations on the ileocecal valve and the hepatic flexure felt secondary to NSAIDs. She relates suprapubic pain located directly beneath her hysterectomy incision that has been worsening gradually for the past 2 years. The pain is present most of the time and is frequently worsened with bowel movements. It radiates through to her back. She states she underwent hysterectomy for endometriosis in 2010. Denies weight loss, diarrhea, change in stool caliber, melena, hematochezia, nausea, vomiting, dysphagia, reflux symptoms, chest pain.  Current Medications, Allergies, Past Medical History, Past Surgical History, Family History and Social History were reviewed in [employer] record.  Physical Exam: General: Well developed , well nourished, no acute distress Head: Normocephalic and atraumatic Eyes:  sclerae anicteric, EOMI Ears: Normal auditory acuity Mouth: No deformity or lesions Lungs: Clear throughout to auscultation Heart: Regular rate and rhythm; no murmurs, rubs or bruits Abdomen: Soft, mild to moderate tenderness at her hysterectomy incision without rebound or guarding and non distended. No masses, hepatosplenomegaly or hernias noted. Normal Bowel sounds Musculoskeletal: Symmetrical with no gross deformities  Pulses:  Normal pulses noted Extremities: No clubbing, cyanosis, edema or deformities noted Neurological: Alert oriented x 4, grossly nonfocal Psychological:  Alert and cooperative. Normal mood and affect  Assessment and Recommendations:  1. Suprapubic abdominal pain/pelvic pain.  Symptoms concerning for painful adhesions. Schedule abdominal/pelvic CT scan to rule out other causes. Her colonoscopy was performed at 2010 and does not need repeated at this time. She will likely need further followup with her gynecologist. He

## 2012-06-30 ENCOUNTER — Other Ambulatory Visit: Payer: Medicare Other

## 2012-07-08 ENCOUNTER — Other Ambulatory Visit: Payer: Medicare Other

## 2012-08-31 ENCOUNTER — Other Ambulatory Visit: Payer: Self-pay

## 2012-08-31 DIAGNOSIS — R109 Unspecified abdominal pain: Secondary | ICD-10-CM

## 2012-09-04 ENCOUNTER — Other Ambulatory Visit: Payer: Medicare Other

## 2012-09-10 ENCOUNTER — Other Ambulatory Visit: Payer: Self-pay

## 2012-09-10 ENCOUNTER — Ambulatory Visit (INDEPENDENT_AMBULATORY_CARE_PROVIDER_SITE_OTHER)
Admission: RE | Admit: 2012-09-10 | Discharge: 2012-09-10 | Disposition: A | Discharge status: Home / Self Care | Payer: Medicare Other | Source: Ambulatory Visit | Attending: Gastroenterology | Admitting: Gastroenterology

## 2012-09-10 DIAGNOSIS — R109 Unspecified abdominal pain: Secondary | ICD-10-CM

## 2012-09-10 DIAGNOSIS — K439 Ventral hernia without obstruction or gangrene: Secondary | ICD-10-CM

## 2012-09-10 MED ORDER — IOHEXOL 300 MG/ML  SOLN
100.0000 mL | Freq: Once | INTRAMUSCULAR | Status: AC | PRN
  Administered 2012-09-10: 100 mL via INTRAVENOUS

## 2012-09-16 ENCOUNTER — Other Ambulatory Visit: Payer: Self-pay | Admitting: Neurology

## 2012-09-21 ENCOUNTER — Ambulatory Visit (INDEPENDENT_AMBULATORY_CARE_PROVIDER_SITE_OTHER): Payer: Self-pay | Admitting: General Surgery

## 2012-09-23 ENCOUNTER — Encounter (INDEPENDENT_AMBULATORY_CARE_PROVIDER_SITE_OTHER): Payer: Self-pay | Admitting: General Surgery

## 2012-09-23 ENCOUNTER — Ambulatory Visit (INDEPENDENT_AMBULATORY_CARE_PROVIDER_SITE_OTHER): Payer: Medicare Other | Admitting: General Surgery

## 2012-09-23 VITALS — BP 116/68 | HR 72 | Ht 62.0 in | Wt 144.2 lb

## 2012-09-23 DIAGNOSIS — K439 Ventral hernia without obstruction or gangrene: Secondary | ICD-10-CM

## 2012-09-23 NOTE — Progress Notes (Signed)
Patient ID: Connie Murray, female   DOB: [birth year], 42 y.o.   MRN: [id number]  No chief complaint on file.   HPI Connie Murray is a 42 y.o. female.   HPI  She is referred by Dr. Russella Dar for evaluation of a ventral hernia. She has chronic constipation and is followed by him. She is complaining of intermittent episodes of severe sharp pain in her lower abdomen around a lower transverse hysterectomy scar. She had a hysterectomy in the past for endometriosis. She states the pain is similar to what she had in the past. Dr. Russella Dar ordered the CT scan. This demonstrated a significant stool burden in the colon. No lower incisional hernias were noted. No pelvic pathology was noted. A small supraumbilical ventral hernia was noted containing a small amount of fat. She is relatively asymptomatic from this.  Past Medical History  Diagnosis Date  . GERD (gastroesophageal reflux disease)   . Anxiety   . Bipolar disorder   . Chronic headaches   . Fibromyalgia   . Chronic lower back pain   . Normocytic anemia     Past Surgical History  Procedure Laterality Date  . Total abdominal hysterectomy w/ bilateral salpingoophorectomy  01/2010    Family History  Problem Relation Age of Onset  . Irritable bowel syndrome    . Ovarian cancer Mother   . Alcoholism Father   . Bone cancer Maternal Uncle     Social History History  Substance Use Topics  . Smoking status: Current Every Day Smoker -- 1.00 packs/day for 28 years    Types: Cigarettes  . Smokeless tobacco: Never Used  . Alcohol Use: No    Allergies  Allergen Reactions  . Benadryl (Diphenhydramine Hcl) Other (See Comments)    Contains red dye, to which patient is allergic  . Diclofenac Other (See Comments)    Cause kidney and liver problems  . Statins Other (See Comments)    Caused liver problems  . Acetaminophen Other (See Comments)    Affects whole body; "makes me feel like crap"  . Codeine Itching    And a really, really bad headache   .  Darvocet (Propoxyphene-Acetaminophen) Nausea And Vomiting  . Latex Dermatitis and Rash    "Occurs mostly only in groin area."  . Red Dye Itching and Rash    "Red streaks across throat, some itching"  . Tetracyclines & Related Nausea And Vomiting    "and severe stomach cramps"    Current Outpatient Prescriptions  Medication Sig Dispense Refill  . albuterol (PROVENTIL HFA;VENTOLIN HFA) 108 (90 BASE) MCG/ACT inhaler Inhale 2 puffs into the lungs every 6 (six) hours as needed.      Marland Kitchen alprazolam (XANAX) 2 MG tablet Take 2 mg by mouth 3 (three) times daily as needed.      Marland Kitchen atropine-PHENObarbital-scopolamine-hyoscyamine (DONNATAL) 16.2 MG tablet Take 1 tablet by mouth as needed.      . B Complex-C (B-COMPLEX WITH VITAMIN C) tablet Take 1 tablet by mouth daily.      . baclofen (LIORESAL) 10 MG tablet Take 10 mg by mouth 2 (two) times daily.      . budesonide-formoterol (SYMBICORT) 160-4.5 MCG/ACT inhaler Inhale 2 puffs into the lungs 2 (two) times daily.      . cetirizine (ZYRTEC) 10 MG tablet Take 10 mg by mouth daily.      Marland Kitchen EPINEPHrine (EPI-PEN) 0.3 mg/0.3 mL DEVI Inject 0.3 mg into the muscle once.      . ezetimibe (ZETIA)  10 MG tablet Take 10 mg by mouth daily.      Marland Kitchen FLUoxetine (PROZAC) 40 MG capsule Take 40 mg by mouth daily.      . fluticasone (FLONASE) 50 MCG/ACT nasal spray Place 2 sprays into the nose daily.      . furosemide (LASIX) 40 MG tablet Take 40 mg by mouth daily.      . hydrocortisone 1 % ointment Apply topically 2 (two) times daily.      . methadone (DOLOPHINE) 10 MG tablet Take 10 mg by mouth every 8 (eight) hours.      Marland Kitchen omeprazole (PRILOSEC) 20 MG capsule Take 20 mg by mouth 2 (two) times daily.      Marland Kitchen oxycodone (OXY-IR) 5 MG capsule Take 5 mg by mouth 2 (two) times daily as needed.      . Potassium Chloride Crys CR (K-DUR PO) Take 40 mg by mouth as needed.      Marland Kitchen QUEtiapine (SEROQUEL XR) 300 MG 24 hr tablet Take 300 mg by mouth at bedtime.      . rizatriptan (MAXALT) 5  MG tablet Take 5 mg by mouth as needed for migraine. May repeat in 2 hours if needed      . topiramate (TOPAMAX) 100 MG tablet TAKE 1 TABLET BY MOUTH EVERY NIGHT AT BEDTIME  90 tablet  1   No current facility-administered medications for this visit.    Review of Systems Review of Systems  Gastrointestinal: Positive for abdominal pain and constipation.  Musculoskeletal: Positive for back pain.  Psychiatric/Behavioral: The patient is nervous/anxious.     Blood pressure 116/68, pulse 72, height 5\' 2"  (1.575 m), weight 144 lb 3.2 oz (65.409 kg).  Physical Exam Physical Exam  Constitutional: She appears well-developed and well-nourished. No distress.  HENT:  Head: Normocephalic and atraumatic.  Abdominal: Soft. She exhibits no distension and no mass. There is no tenderness.  There is a very small supraumbilical mass only noticeable when she tenses her abdominal muscles. Lower transverse scar is intact without evidence of hernia.    Data Reviewed Note from Dr. Russella Dar. CT scan.  Assessment    Small asymptomatic supraumbilical ventral hernia. I do not feel this is related to her lower abdominal sharp pains. I suspect this could be related to her chronic constipation and pressure from her colon on the underside of her incision versus spasm of the colon. She still has a significant stool burden on CT scan.     Plan    I told her I did not feel that repair of this very small asymptomatic hernia was necessary at this time. However, I told her if it became symptomatic or got larger than I would like to see her back and at that time repair would be indicated. I do not feel that any surgery for her lower abdominal pain would be helpful.       Ahlani Wickes J 09/23/2012, 11:53 AM

## 2012-09-23 NOTE — Patient Instructions (Signed)
If the hernia above your navel gets larger, please call for an appointment to discuss surgical repair.

## 2012-10-01 ENCOUNTER — Other Ambulatory Visit: Payer: Self-pay | Admitting: Nurse Practitioner

## 2012-11-24 ENCOUNTER — Encounter (HOSPITAL_COMMUNITY): Payer: Self-pay | Admitting: Emergency Medicine

## 2012-11-24 ENCOUNTER — Emergency Department (HOSPITAL_COMMUNITY)
Admission: EM | Admit: 2012-11-24 | Discharge: 2012-11-24 | Disposition: A | Discharge status: Home / Self Care | Payer: Medicare Other | Attending: Emergency Medicine | Admitting: Emergency Medicine

## 2012-11-24 DIAGNOSIS — F411 Generalized anxiety disorder: Secondary | ICD-10-CM | POA: Insufficient documentation

## 2012-11-24 DIAGNOSIS — F319 Bipolar disorder, unspecified: Secondary | ICD-10-CM | POA: Insufficient documentation

## 2012-11-24 DIAGNOSIS — F17200 Nicotine dependence, unspecified, uncomplicated: Secondary | ICD-10-CM | POA: Insufficient documentation

## 2012-11-24 DIAGNOSIS — Z79899 Other long term (current) drug therapy: Secondary | ICD-10-CM | POA: Insufficient documentation

## 2012-11-24 DIAGNOSIS — Z8679 Personal history of other diseases of the circulatory system: Secondary | ICD-10-CM | POA: Insufficient documentation

## 2012-11-24 DIAGNOSIS — Z8739 Personal history of other diseases of the musculoskeletal system and connective tissue: Secondary | ICD-10-CM | POA: Insufficient documentation

## 2012-11-24 DIAGNOSIS — Z862 Personal history of diseases of the blood and blood-forming organs and certain disorders involving the immune mechanism: Secondary | ICD-10-CM | POA: Insufficient documentation

## 2012-11-24 DIAGNOSIS — G8929 Other chronic pain: Secondary | ICD-10-CM | POA: Insufficient documentation

## 2012-11-24 DIAGNOSIS — K219 Gastro-esophageal reflux disease without esophagitis: Secondary | ICD-10-CM | POA: Insufficient documentation

## 2012-11-24 MED ORDER — HYDROMORPHONE HCL PF 2 MG/ML IJ SOLN
2.0000 mg | Freq: Once | INTRAMUSCULAR | Status: AC
  Administered 2012-11-24: 2 mg via INTRAMUSCULAR
  Filled 2012-11-24: qty 1

## 2012-11-24 NOTE — ED Provider Notes (Signed)
History  This chart was scribed for Roxy Horseman - PA by Ladona Ridgel Day, ED scribe. This patient was seen in room TR10C/TR10C and the patient's care was started at 2058.  CSN: [id number] Arrival date & time 11/24/12  2037  First MD Initiated Contact with Patient 11/24/12 2058     Chief Complaint  Patient presents with  . Back Pain   The history is provided by the patient. No language interpreter was used.   HPI Comments: Connie Murray is a 42 y.o. female who presents to the Emergency Department complaining of being out of her methadone for chronic back/neck pain for past several days b/c her pain management doctor would not see her at her schedule appointment. She states back/neck pain is normal for her and worsened now that she doesn't have her regular medicines. She denies any new or recent injuries to her neck/back but states she did fall over on her left arm b/c she was so upset about not getting her prescription refilled.      Past Medical History  Diagnosis Date  . GERD (gastroesophageal reflux disease)   . Anxiety   . Bipolar disorder   . Chronic headaches   . Fibromyalgia   . Chronic lower back pain   . Normocytic anemia    Past Surgical History  Procedure Laterality Date  . Total abdominal hysterectomy w/ bilateral salpingoophorectomy  01/2010   Family History  Problem Relation Age of Onset  . Irritable bowel syndrome    . Ovarian cancer Mother   . Alcoholism Father   . Bone cancer Maternal Uncle    History  Substance Use Topics  . Smoking status: Current Every Day Smoker -- 1.00 packs/day for 28 years    Types: Cigarettes  . Smokeless tobacco: Never Used  . Alcohol Use: No   OB History   Grav Para Term Preterm Abortions TAB SAB Ect Mult Living                 Review of Systems  Constitutional: Negative for fever and chills.  HENT: Negative for congestion and rhinorrhea.   Respiratory: Negative for shortness of breath.   Cardiovascular: Negative for chest  pain.  Gastrointestinal: Negative for nausea, vomiting and abdominal pain.  Musculoskeletal: Positive for back pain (chronic back/neck pain).  Skin: Negative for color change and pallor.  Neurological: Negative for syncope and weakness.  All other systems reviewed and are negative.   A complete 10 system review of systems was obtained and all systems are negative except as noted in the HPI and PMH.   Allergies  Benadryl; Diclofenac; Statins; Acetaminophen; Codeine; Darvocet; Latex; Red dye; and Tetracyclines & related  Home Medications   Current Outpatient Rx  Name  Route  Sig  Dispense  Refill  . albuterol (PROVENTIL HFA;VENTOLIN HFA) 108 (90 BASE) MCG/ACT inhaler   Inhalation   Inhale 2 puffs into the lungs every 6 (six) hours as needed for shortness of breath.          . alprazolam (XANAX) 2 MG tablet   Oral   Take 2 mg by mouth 3 (three) times daily as needed for anxiety.          Marland Kitchen atropine-PHENObarbital-scopolamine-hyoscyamine (DONNATAL) 16.2 MG tablet   Oral   Take 1 tablet by mouth as needed. For pain         . B Complex-C (B-COMPLEX WITH VITAMIN C) tablet   Oral   Take 2 tablets by mouth daily.          Marland Kitchen  baclofen (LIORESAL) 10 MG tablet   Oral   Take 10 mg by mouth 2 (two) times daily.         . budesonide-formoterol (SYMBICORT) 160-4.5 MCG/ACT inhaler   Inhalation   Inhale 2 puffs into the lungs 2 (two) times daily.         . cetirizine (ZYRTEC) 10 MG tablet   Oral   Take 10 mg by mouth daily.         Marland Kitchen EPINEPHrine (EPI-PEN) 0.3 mg/0.3 mL DEVI   Intramuscular   Inject 0.3 mg into the muscle once.         . ezetimibe (ZETIA) 10 MG tablet   Oral   Take 10 mg by mouth daily.         Marland Kitchen FLUoxetine (PROZAC) 40 MG capsule   Oral   Take 40 mg by mouth daily.         . fluticasone (FLONASE) 50 MCG/ACT nasal spray   Nasal   Place 2 sprays into the nose daily.         . furosemide (LASIX) 40 MG tablet   Oral   Take 40 mg by mouth  daily.         . methadone (DOLOPHINE) 10 MG tablet   Oral   Take 10 mg by mouth every 8 (eight) hours.         Marland Kitchen omeprazole (PRILOSEC) 20 MG capsule   Oral   Take 20 mg by mouth 2 (two) times daily.         Marland Kitchen oxycodone (OXY-IR) 5 MG capsule   Oral   Take 5 mg by mouth 2 (two) times daily as needed for pain.          Marland Kitchen Potassium Chloride Crys CR (K-DUR PO)   Oral   Take 40 mg by mouth daily.          . QUEtiapine (SEROQUEL XR) 300 MG 24 hr tablet   Oral   Take 300 mg by mouth at bedtime.         . rizatriptan (MAXALT) 10 MG tablet      TAKE ONE TABLET BY MOUTH AS NEEDED FOR HEADACHE , MAY REPEAT 1 IN 24 HOURS   9 tablet   5   . rizatriptan (MAXALT) 5 MG tablet   Oral   Take 5 mg by mouth as needed for migraine. May repeat in 2 hours if needed         . topiramate (TOPAMAX) 100 MG tablet      TAKE 1 TABLET BY MOUTH EVERY NIGHT AT BEDTIME   90 tablet   1    Triage Vitals: BP 124/95  Pulse 115  Temp(Src) 98 F (36.7 C) (Oral)  Resp 20  SpO2 98% Physical Exam  Nursing note and vitals reviewed. Constitutional: She is oriented to person, place, and time. She appears well-developed and well-nourished. No distress.  HENT:  Head: Normocephalic and atraumatic.  Eyes: Conjunctivae and EOM are normal. Right eye exhibits no discharge. Left eye exhibits no discharge. No scleral icterus.  Neck: Normal range of motion. Neck supple. No tracheal deviation present.  Cardiovascular: Normal rate, regular rhythm and normal heart sounds.  Exam reveals no gallop and no friction rub.   No murmur heard. Pulmonary/Chest: Effort normal and breath sounds normal. No respiratory distress. She has no wheezes.  Abdominal: Soft. She exhibits no distension. There is no tenderness.  Musculoskeletal: Normal range of motion.  Diffuse paraspinal muscles tender  to palpation, no bony tenderness, step-offs, or gross abnormality or deformity of spine, patient is able to ambulate, moves  all extremities  Neurological: She is alert and oriented to person, place, and time.  Sensation and strength intact bilaterally  Skin: Skin is warm. She is not diaphoretic.  Psychiatric: She has a normal mood and affect. Judgment and thought content normal.  Tearful and sobbing during exam    ED Course  Procedures (including critical care time) DIAGNOSTIC STUDIES: Oxygen Saturation is 98% on room air, normal by my interpretation.    COORDINATION OF CARE: At 930 PM Discussed treatment plan with patient which includes dilaudid IM. Patient agrees.   Labs Reviewed - No data to display No results found. 1. Chronic pain     MDM  Patient with back pain.  No neurological deficits and normal neuro exam.  Patient can walk but states is painful.  No loss of bowel or bladder control.  No concern for cauda equina.  No fever, night sweats, weight loss, h/o cancer, IVDU.   Patient's back pain is chronic. Will treat patient's pain in the emergency department, and recommend followup with pain management. Patient understands and agrees with the plan.   I personally performed the services described in this documentation, which was scribed in my presence. The recorded information has been reviewed and is accurate.    Roxy Horseman, PA-C 11/24/12 2132  Roxy Horseman, PA-C 11/24/12 2133

## 2012-11-24 NOTE — ED Notes (Signed)
PT. REPORTS CHRONIC MID BACK AND BACK OF NECK PAIN FOR SEVERAL DAYS / PT. ALSO STATED THAT SHE FELL LAST Thursday WITH NO INJURY  , PT. STATED SHE RAN OUT OF HER PAIN MEDICATION , RESPIRATIONS UNLABORED / AMBULATORY . HISTORY OF FIBROMYALGIA AND CHRONIC BACK PAIN .

## 2012-11-25 ENCOUNTER — Encounter (HOSPITAL_COMMUNITY): Payer: Self-pay | Admitting: Emergency Medicine

## 2012-11-25 ENCOUNTER — Telehealth: Payer: Self-pay | Admitting: Neurology

## 2012-11-25 ENCOUNTER — Emergency Department (HOSPITAL_COMMUNITY)
Admission: EM | Admit: 2012-11-25 | Discharge: 2012-11-25 | Disposition: A | Discharge status: Home / Self Care | Payer: Medicare Other | Attending: Emergency Medicine | Admitting: Emergency Medicine

## 2012-11-25 DIAGNOSIS — F17200 Nicotine dependence, unspecified, uncomplicated: Secondary | ICD-10-CM | POA: Insufficient documentation

## 2012-11-25 DIAGNOSIS — M542 Cervicalgia: Secondary | ICD-10-CM | POA: Insufficient documentation

## 2012-11-25 DIAGNOSIS — Z862 Personal history of diseases of the blood and blood-forming organs and certain disorders involving the immune mechanism: Secondary | ICD-10-CM | POA: Insufficient documentation

## 2012-11-25 DIAGNOSIS — IMO0001 Reserved for inherently not codable concepts without codable children: Secondary | ICD-10-CM | POA: Insufficient documentation

## 2012-11-25 DIAGNOSIS — Z79899 Other long term (current) drug therapy: Secondary | ICD-10-CM | POA: Insufficient documentation

## 2012-11-25 DIAGNOSIS — M545 Low back pain, unspecified: Secondary | ICD-10-CM | POA: Insufficient documentation

## 2012-11-25 DIAGNOSIS — G8929 Other chronic pain: Secondary | ICD-10-CM | POA: Insufficient documentation

## 2012-11-25 DIAGNOSIS — Z8679 Personal history of other diseases of the circulatory system: Secondary | ICD-10-CM | POA: Insufficient documentation

## 2012-11-25 DIAGNOSIS — Z91040 Latex allergy status: Secondary | ICD-10-CM | POA: Insufficient documentation

## 2012-11-25 DIAGNOSIS — K219 Gastro-esophageal reflux disease without esophagitis: Secondary | ICD-10-CM | POA: Insufficient documentation

## 2012-11-25 DIAGNOSIS — M549 Dorsalgia, unspecified: Secondary | ICD-10-CM

## 2012-11-25 DIAGNOSIS — F411 Generalized anxiety disorder: Secondary | ICD-10-CM | POA: Insufficient documentation

## 2012-11-25 DIAGNOSIS — F319 Bipolar disorder, unspecified: Secondary | ICD-10-CM | POA: Insufficient documentation

## 2012-11-25 MED ORDER — HYDROMORPHONE HCL PF 1 MG/ML IJ SOLN
1.0000 mg | Freq: Once | INTRAMUSCULAR | Status: AC
  Administered 2012-11-25: 1 mg via INTRAMUSCULAR
  Filled 2012-11-25: qty 1

## 2012-11-25 NOTE — ED Notes (Signed)
PT. REPORTS CHRONIC NECK AND BACK PAIN SEEN AND TREATED HERE LAST NIGHT FOR THE SAME COMPLAINTS . AMBULATORY / RESPIRATIONS UNLABORED.

## 2012-11-25 NOTE — ED Notes (Signed)
Pt ambulated independently, no noted difficulty. Pt able to move head from side to side. Pt tearful, sobbing about "how everyone thinks I am pain seeking. I have been kicked out by my doctors because they think I am a drug addict and I am not."

## 2012-11-25 NOTE — ED Provider Notes (Signed)
Medical screening examination/treatment/procedure(s) were performed by non-physician practitioner and as supervising physician I was immediately available for consultation/collaboration.  Doug Sou, MD 11/25/12 [phone]

## 2012-11-25 NOTE — ED Provider Notes (Signed)
History  This chart was scribed for non-physician practitioner working with Connie Gaskins, MD by Greggory Stallion, ED scribe. This patient was seen in room TR06C/TR06C and the patient's care was started at 9:33 PM.  CSN: [id number] Arrival date & time 11/25/12  1945   Chief Complaint  Patient presents with  . Neck Pain   The history is provided by the patient. No language interpreter was used.    HPI Comments: Connie Murray is a 42 y.o. Female with who presents to the ED for chronic neck pain and back pain. Was seen last night for the same.  Pt states she had an appt with pain management scheduled for today and when she showed up her physician told her "she was a drug addict" and was dismissed her from his practice.  Called neurologist and requested emergency visit but phone call was not returned.  No new injury or trauma.  Intermittent paresthesias of UE-- not of new onset.  No loss of bowel or bladder function.  Past Medical History  Diagnosis Date  . GERD (gastroesophageal reflux disease)   . Anxiety   . Bipolar disorder   . Chronic headaches   . Fibromyalgia   . Chronic lower back pain   . Normocytic anemia    Past Surgical History  Procedure Laterality Date  . Total abdominal hysterectomy w/ bilateral salpingoophorectomy  01/2010   Family History  Problem Relation Age of Onset  . Irritable bowel syndrome    . Ovarian cancer Mother   . Alcoholism Father   . Bone cancer Maternal Uncle    History  Substance Use Topics  . Smoking status: Current Every Day Smoker -- 1.00 packs/day for 28 years    Types: Cigarettes  . Smokeless tobacco: Never Used  . Alcohol Use: No   OB History   Grav Para Term Preterm Abortions TAB SAB Ect Mult Living                 Review of Systems  Musculoskeletal: Positive for back pain.  All other systems reviewed and are negative.    Allergies  Benadryl; Diclofenac; Statins; Acetaminophen; Codeine; Darvocet; Latex; Red dye; and  Tetracyclines & related  Home Medications   Current Outpatient Rx  Name  Route  Sig  Dispense  Refill  . albuterol (PROVENTIL HFA;VENTOLIN HFA) 108 (90 BASE) MCG/ACT inhaler   Inhalation   Inhale 2 puffs into the lungs every 6 (six) hours as needed for shortness of breath.          . alprazolam (XANAX) 2 MG tablet   Oral   Take 2 mg by mouth 3 (three) times daily as needed for anxiety.          Marland Kitchen atropine-PHENObarbital-scopolamine-hyoscyamine (DONNATAL) 16.2 MG tablet   Oral   Take 1 tablet by mouth as needed. For pain         . B Complex-C (B-COMPLEX WITH VITAMIN C) tablet   Oral   Take 2 tablets by mouth daily.          . baclofen (LIORESAL) 10 MG tablet   Oral   Take 10 mg by mouth 2 (two) times daily.         . budesonide-formoterol (SYMBICORT) 160-4.5 MCG/ACT inhaler   Inhalation   Inhale 2 puffs into the lungs 2 (two) times daily.         . cetirizine (ZYRTEC) 10 MG tablet   Oral   Take 10 mg by mouth daily.         Marland Kitchen  EPINEPHrine (EPI-PEN) 0.3 mg/0.3 mL DEVI   Intramuscular   Inject 0.3 mg into the muscle once.         . ezetimibe (ZETIA) 10 MG tablet   Oral   Take 10 mg by mouth daily.         Marland Kitchen FLUoxetine (PROZAC) 40 MG capsule   Oral   Take 40 mg by mouth daily.         . fluticasone (FLONASE) 50 MCG/ACT nasal spray   Nasal   Place 2 sprays into the nose daily.         . furosemide (LASIX) 40 MG tablet   Oral   Take 40 mg by mouth daily.         . methadone (DOLOPHINE) 10 MG tablet   Oral   Take 10 mg by mouth every 8 (eight) hours.         Marland Kitchen omeprazole (PRILOSEC) 20 MG capsule   Oral   Take 20 mg by mouth 2 (two) times daily.         Marland Kitchen oxycodone (OXY-IR) 5 MG capsule   Oral   Take 5 mg by mouth 2 (two) times daily as needed for pain.          Marland Kitchen Potassium Chloride Crys CR (K-DUR PO)   Oral   Take 40 mg by mouth daily.          . QUEtiapine (SEROQUEL XR) 300 MG 24 hr tablet   Oral   Take 300 mg by mouth at  bedtime.         . rizatriptan (MAXALT) 10 MG tablet      TAKE ONE TABLET BY MOUTH AS NEEDED FOR HEADACHE , MAY REPEAT 1 IN 24 HOURS   9 tablet   5   . rizatriptan (MAXALT) 5 MG tablet   Oral   Take 5 mg by mouth as needed for migraine. May repeat in 2 hours if needed         . topiramate (TOPAMAX) 100 MG tablet      TAKE 1 TABLET BY MOUTH EVERY NIGHT AT BEDTIME   90 tablet   1    BP 126/90  Pulse 112  Temp(Src) 98.1 F (36.7 C) (Oral)  Resp 20  SpO2 100%  Physical Exam  Nursing note and vitals reviewed. Constitutional: She is oriented to person, place, and time. She appears well-developed and well-nourished. No distress.  HENT:  Head: Normocephalic and atraumatic.  Mouth/Throat: Oropharynx is clear and moist.  Eyes: Conjunctivae and EOM are normal.  Neck: Normal range of motion. Neck supple.  Cardiovascular: Normal rate, regular rhythm and normal heart sounds.   Pulmonary/Chest: Effort normal and breath sounds normal.  Musculoskeletal: Normal range of motion.  Diffuse tenderness of CS and LS paraspinal muscles; no midline tenderness or gross deformity, moves all extremities appropriately without ataxia, sensation intact  Neurological: She is alert and oriented to person, place, and time.  Skin: Skin is warm and dry.  Psychiatric: Her mood appears anxious. Her speech is rapid and/or pressured.  Tearful, speech rapid and pressured    ED Course  Procedures (including critical care time)  DIAGNOSTIC STUDIES: Oxygen Saturation is 100% on RA, normal by my interpretation.    COORDINATION OF CARE: 8:13 PM-Discussed treatment plan which includes pain meds, pt agreed.  Labs Reviewed - No data to display No results found.  1. Chronic back pain   2. Chronic neck pain     MDM  Chronic neck and back pain without new injury or trauma--imaging deferred.  Doubt SCI, cauda equina, or other neurovascular compromise.  Patient given IM Dilaudid in the ED. Instructed to  followup with new pain management specialist, resource guide given.  Discussed plan with patient, she agreed. Return precautions advised.  I personally performed the services described in this documentation, which was scribed in my presence. The recorded information has been reviewed and is accurate.   Garlon Hatchet, PA-C 11/25/12 2328

## 2012-11-25 NOTE — ED Notes (Signed)
PA  Lisa at bedside. 

## 2012-11-26 ENCOUNTER — Telehealth: Payer: Self-pay | Admitting: Neurology

## 2012-11-26 MED ORDER — OXYCODONE HCL 5 MG PO CAPS: 5.0000 mg | ORAL_CAPSULE | Freq: Two times a day (BID) | ORAL | Status: DC | PRN

## 2012-11-26 NOTE — Telephone Encounter (Signed)
Message copied by Stephanie Acre on Thu Nov 26, 2012 12:02 PM ------      Message from: Avie Echevaria      Created: Thu Nov 26, 2012  9:23 AM       Dr Anne Hahn,            From Encounters:            2 ER visits for back/neck pain on 11/25/2012.            Comment from previous call on 11/25/12:      "The physician who was filling her pain meds is no longer her doctor.  She needs to know if you would be willing to take care of this for her?  Please call."                   ----- Message -----         From: Eugenie Birks         Sent: 11/25/2012   4:49 PM           To: [facility] Triage Pool            Pt called back crying wanting to know why noone has called her back.       ------

## 2012-11-26 NOTE — Telephone Encounter (Signed)
I called the patient. Apparently, her primary care physician no longer wishes to write prescriptions for the methadone and oxycodone, and she wants our office to do this. The patient is being treated for cervical spondylosis and fibromyalgia. In my opinion, the chronic use of opiate medications are not indicated with this condition. I will give her a prescription for the oxycodone, not the methadone, and we will get a revisit set up for her to initiate a taper off of the opiate medications. In the long run, this should improve her pain level. We will get a revisit.

## 2012-11-27 ENCOUNTER — Encounter: Payer: Self-pay | Admitting: Neurology

## 2012-11-27 ENCOUNTER — Other Ambulatory Visit: Payer: Self-pay | Admitting: Neurology

## 2012-11-27 ENCOUNTER — Ambulatory Visit (INDEPENDENT_AMBULATORY_CARE_PROVIDER_SITE_OTHER): Payer: Medicare Other | Admitting: Neurology

## 2012-11-27 VITALS — BP 132/83 | HR 107 | Ht 61.0 in | Wt 142.0 lb

## 2012-11-27 DIAGNOSIS — IMO0001 Reserved for inherently not codable concepts without codable children: Secondary | ICD-10-CM

## 2012-11-27 HISTORY — DX: Reserved for inherently not codable concepts without codable children: IMO0001

## 2012-11-27 MED ORDER — DULOXETINE HCL 30 MG PO CPEP: ORAL_CAPSULE | ORAL | Status: DC

## 2012-11-27 MED ORDER — FLUOXETINE HCL 20 MG PO CAPS: ORAL_CAPSULE | ORAL | Status: DC

## 2012-11-27 MED ORDER — EZETIMIBE 10 MG PO TABS: 10.0000 mg | ORAL_TABLET | Freq: Every day | ORAL | Status: DC

## 2012-11-27 NOTE — Progress Notes (Signed)
Reason for visit: Fibromyalgia  Connie Murray is an 42 y.o. female  History of present illness:  Connie Murray is a 42 year old right-handed white female with a history of fibromyalgia. The patient has been seen through a pain center previously, and her primary physician was writing medications for oxycodone and for methadone. The patient is on no medications that have been FDA approved for fibromyalgia. In the past, she took gabapentin which was not helpful, and she indicates that Lyrica caused blackouts. The patient has never been on Cymbalta or Savella. The patient is on Prozac taking 40 mg daily. The patient indicates that she has essentially total body pain, with pain in the neck, shoulders, neck, elbows, wrists, low back, hips, thighs, knees, and feet. The patient has crepitus in the shoulder joints. The patient has had MRI evaluation of the cervical spine that showed some spondylosis without nerve root impingement at the C5-6 and C6-7 levels. Epidural steroid injections in the past have been helpful. The patient has been on diclofenac previously, but this resulted in renal insufficiency. The patient comes to this office for an evaluation. The patient is trying to have another primary care physician.  Past Medical History  Diagnosis Date  . GERD (gastroesophageal reflux disease)   . Anxiety   . Bipolar disorder   . Chronic headaches   . Fibromyalgia   . Chronic lower back pain   . Normocytic anemia   . Cervical spondylosis   . Depression   . Migraine   . Radiculopathy     C8 or T1  . Degenerative arthritis   . Dyslipidemia   . Myalgia and myositis, unspecified 11/27/2012    Past Surgical History  Procedure Laterality Date  . Total abdominal hysterectomy w/ bilateral salpingoophorectomy  01/2010  . Nasal sinus surgery      Family History  Problem Relation Age of Onset  . Irritable bowel syndrome    . Ovarian cancer Mother   . Alcoholism Father   . Bone cancer Maternal Uncle      Social history:  reports that she has been smoking Cigarettes.  She has a 28 pack-year smoking history. She has never used smokeless tobacco. She reports that she does not drink alcohol or use illicit drugs.  Allergies:  Allergies  Allergen Reactions  . Benadryl (Diphenhydramine Hcl) Other (See Comments)    Contains red dye, to which patient is allergic  . Diclofenac Other (See Comments)    Cause kidney and liver problems  . Statins Other (See Comments)    Caused liver problems  . Acetaminophen Other (See Comments)    Affects whole body; "makes me feel like crap"  . Codeine Itching    And a really, really bad headache   . Darvocet (Propoxyphene-Acetaminophen) Nausea And Vomiting  . Latex Dermatitis and Rash    "Occurs mostly only in groin area."  . Red Dye Itching and Rash    "Red streaks across throat, some itching"  . Tetracyclines & Related Nausea And Vomiting    "and severe stomach cramps"    Medications:  Current Outpatient Prescriptions on File Prior to Visit  Medication Sig Dispense Refill  . albuterol (PROVENTIL HFA;VENTOLIN HFA) 108 (90 BASE) MCG/ACT inhaler Inhale 2 puffs into the lungs every 6 (six) hours as needed for shortness of breath.       . alprazolam (XANAX) 2 MG tablet Take 2 mg by mouth 3 (three) times daily as needed for anxiety.       Marland Kitchen  atropine-PHENObarbital-scopolamine-hyoscyamine (DONNATAL) 16.2 MG tablet Take 1 tablet by mouth as needed. For pain      . B Complex-C (B-COMPLEX WITH VITAMIN C) tablet Take 2 tablets by mouth daily.       . baclofen (LIORESAL) 10 MG tablet Take 10 mg by mouth 2 (two) times daily.      . budesonide-formoterol (SYMBICORT) 160-4.5 MCG/ACT inhaler Inhale 2 puffs into the lungs 2 (two) times daily.      . cetirizine (ZYRTEC) 10 MG tablet Take 10 mg by mouth daily.      Marland Kitchen EPINEPHrine (EPI-PEN) 0.3 mg/0.3 mL DEVI Inject 0.3 mg into the muscle once.      . fluticasone (FLONASE) 50 MCG/ACT nasal spray Place 2 sprays into the  nose daily.      . furosemide (LASIX) 40 MG tablet Take 40 mg by mouth daily.      Marland Kitchen omeprazole (PRILOSEC) 20 MG capsule Take 20 mg by mouth 2 (two) times daily.      Marland Kitchen oxycodone (OXY-IR) 5 MG capsule Take 1 capsule (5 mg total) by mouth 2 (two) times daily as needed for pain (Must last 28 days.).  60 capsule  0  . QUEtiapine (SEROQUEL XR) 300 MG 24 hr tablet Take 300 mg by mouth at bedtime.      . rizatriptan (MAXALT) 10 MG tablet TAKE ONE TABLET BY MOUTH AS NEEDED FOR HEADACHE , MAY REPEAT 1 IN 24 HOURS  9 tablet  5  . topiramate (TOPAMAX) 100 MG tablet TAKE 1 TABLET BY MOUTH EVERY NIGHT AT BEDTIME  90 tablet  1   No current facility-administered medications on file prior to visit.    ROS:  Out of a complete 14 system review of symptoms, the patient complains only of the following symptoms, and all other reviewed systems are negative.  Difficulty swallowing Blurred vision Blood in the stools, constipation Joint pain, achy muscles Allergies, runny nose Headache, numbness, weakness  Blood pressure 132/83, pulse 107, height 5\' 1"  (1.549 m), weight 142 lb (64.411 kg).  Physical Exam  General: The patient is alert and cooperative at the time of the examination.  Neuromuscular: The patient lacks about 10 of full lateral deviation of the cervical spine bilaterally.  Skin: No significant peripheral edema is noted.   Neurologic Exam  Cranial nerves: Facial symmetry is present. Speech is normal, no aphasia or dysarthria is noted. Extraocular movements are full. Visual fields are full.  Motor: The patient has good strength in all 4 extremities.  Coordination: The patient has good finger-nose-finger and heel-to-shin bilaterally.  Gait and station: The patient has a normal gait. Tandem gait is normal. Romberg is negative. No drift is seen.  Reflexes: Deep tendon reflexes are symmetric.   Assessment/Plan:  1. Fibromyalgia  2. History of headache  The patient is on disability  for her fibromyalgia. I am not clear that long-term daily use of narcotic medications such as methadone and oxycodone are the appropriate way to treat fibromyalgia. The patient is off of methadone, and we will gradually taper the number of oxycodone tablets that she gets every month. The patient will be taken off of the Prozac by going to 20 mg daily for 3 weeks, and then she will come off the medication. The patient will go on Cymbalta taking 30 mg daily for 3 weeks, then she will take one capsule twice daily. The patient followup in 4 or 5 months.  Marlan Palau MD 11/29/2012 7:21 PM  [facility]  [address] [place], [place] [address]  Phone [phone] Fax [phone]

## 2012-11-28 NOTE — ED Provider Notes (Signed)
Medical screening examination/treatment/procedure(s) were performed by non-physician practitioner and as supervising physician I was immediately available for consultation/collaboration.   [name], MD 11/28/12 [phone]

## 2012-11-30 ENCOUNTER — Emergency Department (HOSPITAL_COMMUNITY)
Admission: EM | Admit: 2012-11-30 | Discharge: 2012-11-30 | Disposition: A | Discharge status: Home / Self Care | Payer: Medicare Other | Attending: Emergency Medicine | Admitting: Emergency Medicine

## 2012-11-30 ENCOUNTER — Telehealth: Payer: Self-pay | Admitting: Neurology

## 2012-11-30 ENCOUNTER — Encounter (HOSPITAL_COMMUNITY): Payer: Self-pay | Admitting: Emergency Medicine

## 2012-11-30 DIAGNOSIS — M542 Cervicalgia: Secondary | ICD-10-CM | POA: Insufficient documentation

## 2012-11-30 DIAGNOSIS — F17200 Nicotine dependence, unspecified, uncomplicated: Secondary | ICD-10-CM | POA: Insufficient documentation

## 2012-11-30 DIAGNOSIS — Z8639 Personal history of other endocrine, nutritional and metabolic disease: Secondary | ICD-10-CM | POA: Insufficient documentation

## 2012-11-30 DIAGNOSIS — Z79899 Other long term (current) drug therapy: Secondary | ICD-10-CM | POA: Insufficient documentation

## 2012-11-30 DIAGNOSIS — Z8739 Personal history of other diseases of the musculoskeletal system and connective tissue: Secondary | ICD-10-CM | POA: Insufficient documentation

## 2012-11-30 DIAGNOSIS — F3289 Other specified depressive episodes: Secondary | ICD-10-CM | POA: Insufficient documentation

## 2012-11-30 DIAGNOSIS — G8929 Other chronic pain: Secondary | ICD-10-CM | POA: Insufficient documentation

## 2012-11-30 DIAGNOSIS — G43909 Migraine, unspecified, not intractable, without status migrainosus: Secondary | ICD-10-CM | POA: Insufficient documentation

## 2012-11-30 DIAGNOSIS — Z91040 Latex allergy status: Secondary | ICD-10-CM | POA: Insufficient documentation

## 2012-11-30 DIAGNOSIS — Z862 Personal history of diseases of the blood and blood-forming organs and certain disorders involving the immune mechanism: Secondary | ICD-10-CM | POA: Insufficient documentation

## 2012-11-30 DIAGNOSIS — IMO0001 Reserved for inherently not codable concepts without codable children: Secondary | ICD-10-CM | POA: Insufficient documentation

## 2012-11-30 DIAGNOSIS — F411 Generalized anxiety disorder: Secondary | ICD-10-CM | POA: Insufficient documentation

## 2012-11-30 DIAGNOSIS — K219 Gastro-esophageal reflux disease without esophagitis: Secondary | ICD-10-CM | POA: Insufficient documentation

## 2012-11-30 DIAGNOSIS — F329 Major depressive disorder, single episode, unspecified: Secondary | ICD-10-CM | POA: Insufficient documentation

## 2012-11-30 DIAGNOSIS — M545 Low back pain, unspecified: Secondary | ICD-10-CM | POA: Insufficient documentation

## 2012-11-30 DIAGNOSIS — M546 Pain in thoracic spine: Secondary | ICD-10-CM | POA: Insufficient documentation

## 2012-11-30 DIAGNOSIS — F319 Bipolar disorder, unspecified: Secondary | ICD-10-CM | POA: Insufficient documentation

## 2012-11-30 MED ORDER — HYDROMORPHONE HCL 2 MG PO TABS
2.0000 mg | ORAL_TABLET | Freq: Once | ORAL | Status: AC
  Administered 2012-11-30: 2 mg via ORAL
  Filled 2012-11-30: qty 1

## 2012-11-30 NOTE — ED Notes (Signed)
Pt states she has chronic pain. Pt states she was given a prescription from her neurologist and she is suppose to make the medication last 28 days. Pt states this medication is not going to work for her because "it is like taking aspirin for a migraine." Pt states "it is wearing me out." Pt states she has ruptured discs in her back. Pt ambulatory to room. NAD noted. Pt states "I can barely eat because I am so nauseated."

## 2012-11-30 NOTE — ED Notes (Signed)
Dr Allen at bedside  

## 2012-11-30 NOTE — ED Provider Notes (Signed)
History    CSN: [id number] Arrival date & time 11/30/12  4098  First MD Initiated Contact with Patient 11/30/12 [phone]     Chief Complaint  Patient presents with  . Back Pain  . Neck Pain   (Consider location/radiation/quality/duration/timing/severity/associated sxs/prior Treatment) Patient is a 42 y.o. female presenting with back pain and neck pain. The history is provided by the patient.  Back Pain Location:  Thoracic spine, lumbar spine and sacro-iliac joint Quality:  Aching Radiates to:  Does not radiate Pain severity:  Moderate Pain is:  Same all the time Onset quality:  Gradual Timing:  Constant Progression:  Unchanged Chronicity:  Chronic Context: not lifting heavy objects and not recent injury   Relieved by:  Nothing Worsened by:  Nothing tried Ineffective treatments:  Narcotics Associated symptoms: no leg pain and no perianal numbness   Neck Pain Associated symptoms: no leg pain   pt with h/o chronic back pain, used to be seen by a pain clinic, not now--has rx for oxycontin by her neurologist, requesting IM dialudid --seen here x 2 in past week for same--no new injuries Past Medical History  Diagnosis Date  . GERD (gastroesophageal reflux disease)   . Anxiety   . Bipolar disorder   . Chronic headaches   . Fibromyalgia   . Chronic lower back pain   . Normocytic anemia   . Cervical spondylosis   . Depression   . Migraine   . Radiculopathy     C8 or T1  . Degenerative arthritis   . Dyslipidemia   . Myalgia and myositis, unspecified 11/27/2012   Past Surgical History  Procedure Laterality Date  . Total abdominal hysterectomy w/ bilateral salpingoophorectomy  01/2010  . Nasal sinus surgery     Family History  Problem Relation Age of Onset  . Irritable bowel syndrome    . Ovarian cancer Mother   . Alcoholism Father   . Bone cancer Maternal Uncle    History  Substance Use Topics  . Smoking status: Current Every Day Smoker -- 1.00 packs/day for 28 years     Types: Cigarettes  . Smokeless tobacco: Never Used  . Alcohol Use: No   OB History   Grav Para Term Preterm Abortions TAB SAB Ect Mult Living                 Review of Systems  HENT: Positive for neck pain.   Musculoskeletal: Positive for back pain.  All other systems reviewed and are negative.    Allergies  Benadryl; Diclofenac; Statins; Acetaminophen; Codeine; Darvocet; Latex; Red dye; and Tetracyclines & related  Home Medications   Current Outpatient Rx  Name  Route  Sig  Dispense  Refill  . albuterol (PROVENTIL HFA;VENTOLIN HFA) 108 (90 BASE) MCG/ACT inhaler   Inhalation   Inhale 2 puffs into the lungs every 6 (six) hours as needed for shortness of breath.          . alprazolam (XANAX) 2 MG tablet   Oral   Take 2 mg by mouth 3 (three) times daily as needed for anxiety.          Marland Kitchen atropine-PHENObarbital-scopolamine-hyoscyamine (DONNATAL) 16.2 MG tablet   Oral   Take 1 tablet by mouth as needed. For pain         . B Complex-C (B-COMPLEX WITH VITAMIN C) tablet   Oral   Take 2 tablets by mouth daily.          . baclofen (LIORESAL) 10 MG  tablet   Oral   Take 10 mg by mouth 2 (two) times daily.         . budesonide-formoterol (SYMBICORT) 160-4.5 MCG/ACT inhaler   Inhalation   Inhale 2 puffs into the lungs 2 (two) times daily.         . cetirizine (ZYRTEC) 10 MG tablet   Oral   Take 10 mg by mouth daily.         . DULoxetine (CYMBALTA) 30 MG capsule      One capsule daily for 3 weeks then one capsule twice daily   180 capsule   1     **Patient requests 90 days supply**   . EPINEPHrine (EPI-PEN) 0.3 mg/0.3 mL DEVI   Intramuscular   Inject 0.3 mg into the muscle once.         . ezetimibe (ZETIA) 10 MG tablet   Oral   Take 1 tablet (10 mg total) by mouth daily.   30 tablet   3   . FLUoxetine (PROZAC) 20 MG capsule      One capsule daily for 3 weeks, then stop   21 capsule   0   . fluticasone (FLONASE) 50 MCG/ACT nasal spray    Nasal   Place 2 sprays into the nose daily.         . furosemide (LASIX) 40 MG tablet   Oral   Take 40 mg by mouth daily.         . methadone (DOLOPHINE) 10 MG tablet   Oral   Take by mouth.         Marland Kitchen omeprazole (PRILOSEC) 20 MG capsule   Oral   Take 20 mg by mouth 2 (two) times daily.         Marland Kitchen oxycodone (OXY-IR) 5 MG capsule   Oral   Take 1 capsule (5 mg total) by mouth 2 (two) times daily as needed for pain (Must last 28 days.).   60 capsule   0   . potassium chloride (KLOR-CON) 20 MEQ packet   Oral   Take 20 mEq by mouth 2 (two) times daily.         . QUEtiapine (SEROQUEL XR) 300 MG 24 hr tablet   Oral   Take 300 mg by mouth at bedtime.         . rizatriptan (MAXALT) 10 MG tablet      TAKE ONE TABLET BY MOUTH AS NEEDED FOR HEADACHE , MAY REPEAT 1 IN 24 HOURS   9 tablet   5   . topiramate (TOPAMAX) 100 MG tablet      TAKE 1 TABLET BY MOUTH EVERY NIGHT AT BEDTIME   90 tablet   1    BP 114/68  Pulse 106  Temp(Src) 98.1 F (36.7 C) (Oral)  Resp 16  SpO2 100% Physical Exam  Nursing note and vitals reviewed. Constitutional: She is oriented to person, place, and time. She appears well-developed and well-nourished.  Non-toxic appearance. No distress.  HENT:  Head: Normocephalic and atraumatic.  Eyes: Conjunctivae, EOM and lids are normal. Pupils are equal, round, and reactive to light.  Neck: Normal range of motion. Neck supple. No tracheal deviation present. No mass present.  Cardiovascular: Normal rate, regular rhythm and normal heart sounds.  Exam reveals no gallop.   No murmur heard. Pulmonary/Chest: Effort normal and breath sounds normal. No stridor. No respiratory distress. She has no decreased breath sounds. She has no wheezes. She has no rhonchi. She has no  rales.  Abdominal: Soft. Normal appearance and bowel sounds are normal. She exhibits no distension. There is no tenderness. There is no rebound and no CVA tenderness.  Musculoskeletal:  Normal range of motion. She exhibits no edema and no tenderness.  Neurological: She is alert and oriented to person, place, and time. She has normal strength. No cranial nerve deficit or sensory deficit. GCS eye subscore is 4. GCS verbal subscore is 5. GCS motor subscore is 6.  Skin: Skin is warm and dry. No abrasion and no rash noted.  Psychiatric: She has a normal mood and affect. Her speech is normal and behavior is normal.    ED Course  Procedures (including critical care time) Labs Reviewed - No data to display No results found. No diagnosis found.  MDM  Pt given oral dilaudid and encouraged to f/u her neurologsit  Toy Baker, MD 11/30/12 [phone]

## 2012-11-30 NOTE — ED Notes (Signed)
Pt alert and mentating appropriately upon d/c. Pt instructed on the importance of not driving home. Pt brother at bedside endorses he will be driving his sister home. Pt educated on at home pain management and importance of taking medication as prescribed and directed by her PCP/neurologist. Pt was not given prescription upon d/c from ER and explained to pt why she was not given one. Pt given PO medication in ER and explained by Dr Freida Busman why she was given certain medication. Pt states she understands and has no further questions upon d/c. NAD noted upon d/c. Pt ambulatory leaving ER with family.

## 2012-11-30 NOTE — ED Notes (Signed)
Pt notified that she would not be able to be d/c from ER until her ride was visible. Pt states she has called her ride and she is on the way. Pt notified she will be d/c once ride arrives.

## 2012-11-30 NOTE — Telephone Encounter (Signed)
I called patient. The patient wanted to know if she could go up on the oxycodone dose. The patient only gets 60 tablets a month. She goes up on the dose, the prescription was not last 20 days. I will not give her more than 60 tablets a month, and my ultimate goal is to get her off the medication completely. The patient indicates ongoing back pain, and she indicates that she has had a slipped disc in the back previously, the last MRI on the lumbosacral spine was done in 2002. I will repeat the study.

## 2012-12-08 ENCOUNTER — Ambulatory Visit (HOSPITAL_COMMUNITY)
Admission: RE | Admit: 2012-12-08 | Discharge: 2012-12-08 | Disposition: A | Discharge status: Home / Self Care | Payer: Medicare Other | Source: Ambulatory Visit | Attending: Neurology | Admitting: Neurology

## 2012-12-08 ENCOUNTER — Telehealth: Payer: Self-pay | Admitting: Neurology

## 2012-12-08 DIAGNOSIS — R209 Unspecified disturbances of skin sensation: Secondary | ICD-10-CM | POA: Insufficient documentation

## 2012-12-08 DIAGNOSIS — R29898 Other symptoms and signs involving the musculoskeletal system: Secondary | ICD-10-CM | POA: Insufficient documentation

## 2012-12-08 DIAGNOSIS — M545 Low back pain: Secondary | ICD-10-CM

## 2012-12-08 DIAGNOSIS — M47817 Spondylosis without myelopathy or radiculopathy, lumbosacral region: Secondary | ICD-10-CM | POA: Insufficient documentation

## 2012-12-08 NOTE — Telephone Encounter (Signed)
I called patient. The MRI study of the lumbosacral spine was relatively unremarkable, showing only mild arthritic changes. No indication for surgery.

## 2012-12-09 ENCOUNTER — Encounter: Payer: Self-pay | Admitting: Internal Medicine

## 2012-12-09 ENCOUNTER — Encounter (HOSPITAL_COMMUNITY): Payer: Self-pay | Admitting: Emergency Medicine

## 2012-12-09 ENCOUNTER — Emergency Department (HOSPITAL_COMMUNITY)
Admission: EM | Admit: 2012-12-09 | Discharge: 2012-12-09 | Disposition: A | Discharge status: Home / Self Care | Payer: Medicare Other | Attending: Emergency Medicine | Admitting: Emergency Medicine

## 2012-12-09 ENCOUNTER — Ambulatory Visit: Payer: Medicare Other | Attending: Internal Medicine | Admitting: Internal Medicine

## 2012-12-09 VITALS — BP 117/84 | HR 111 | Temp 98.5°F | Resp 18 | Wt 141.0 lb

## 2012-12-09 DIAGNOSIS — F3289 Other specified depressive episodes: Secondary | ICD-10-CM | POA: Insufficient documentation

## 2012-12-09 DIAGNOSIS — K219 Gastro-esophageal reflux disease without esophagitis: Secondary | ICD-10-CM | POA: Insufficient documentation

## 2012-12-09 DIAGNOSIS — M546 Pain in thoracic spine: Secondary | ICD-10-CM | POA: Insufficient documentation

## 2012-12-09 DIAGNOSIS — E785 Hyperlipidemia, unspecified: Secondary | ICD-10-CM | POA: Insufficient documentation

## 2012-12-09 DIAGNOSIS — M542 Cervicalgia: Secondary | ICD-10-CM | POA: Insufficient documentation

## 2012-12-09 DIAGNOSIS — J45909 Unspecified asthma, uncomplicated: Secondary | ICD-10-CM | POA: Insufficient documentation

## 2012-12-09 DIAGNOSIS — F3162 Bipolar disorder, current episode mixed, moderate: Secondary | ICD-10-CM | POA: Insufficient documentation

## 2012-12-09 DIAGNOSIS — F319 Bipolar disorder, unspecified: Secondary | ICD-10-CM | POA: Insufficient documentation

## 2012-12-09 DIAGNOSIS — F17200 Nicotine dependence, unspecified, uncomplicated: Secondary | ICD-10-CM | POA: Insufficient documentation

## 2012-12-09 DIAGNOSIS — J4520 Mild intermittent asthma, uncomplicated: Secondary | ICD-10-CM

## 2012-12-09 DIAGNOSIS — Z8739 Personal history of other diseases of the musculoskeletal system and connective tissue: Secondary | ICD-10-CM | POA: Insufficient documentation

## 2012-12-09 DIAGNOSIS — F411 Generalized anxiety disorder: Secondary | ICD-10-CM | POA: Insufficient documentation

## 2012-12-09 DIAGNOSIS — D649 Anemia, unspecified: Secondary | ICD-10-CM

## 2012-12-09 DIAGNOSIS — M7730 Calcaneal spur, unspecified foot: Secondary | ICD-10-CM | POA: Insufficient documentation

## 2012-12-09 DIAGNOSIS — Z79899 Other long term (current) drug therapy: Secondary | ICD-10-CM | POA: Insufficient documentation

## 2012-12-09 DIAGNOSIS — E876 Hypokalemia: Secondary | ICD-10-CM | POA: Insufficient documentation

## 2012-12-09 DIAGNOSIS — M797 Fibromyalgia: Secondary | ICD-10-CM

## 2012-12-09 DIAGNOSIS — Z862 Personal history of diseases of the blood and blood-forming organs and certain disorders involving the immune mechanism: Secondary | ICD-10-CM | POA: Insufficient documentation

## 2012-12-09 DIAGNOSIS — G43909 Migraine, unspecified, not intractable, without status migrainosus: Secondary | ICD-10-CM

## 2012-12-09 DIAGNOSIS — F329 Major depressive disorder, single episode, unspecified: Secondary | ICD-10-CM | POA: Insufficient documentation

## 2012-12-09 DIAGNOSIS — G8929 Other chronic pain: Secondary | ICD-10-CM | POA: Insufficient documentation

## 2012-12-09 DIAGNOSIS — M549 Dorsalgia, unspecified: Secondary | ICD-10-CM | POA: Insufficient documentation

## 2012-12-09 DIAGNOSIS — M545 Low back pain, unspecified: Secondary | ICD-10-CM | POA: Insufficient documentation

## 2012-12-09 DIAGNOSIS — IMO0001 Reserved for inherently not codable concepts without codable children: Secondary | ICD-10-CM | POA: Insufficient documentation

## 2012-12-09 LAB — CBC WITH DIFFERENTIAL/PLATELET
Basophils Relative: 1 % (ref 0–1)
Hemoglobin: 13.5 g/dL (ref 12.0–15.0)
Lymphs Abs: 2.5 10*3/uL (ref 0.7–4.0)
Monocytes Relative: 9 % (ref 3–12)
Neutro Abs: 4.4 10*3/uL (ref 1.7–7.7)
Neutrophils Relative %: 57 % (ref 43–77)
RBC: 4.1 MIL/uL (ref 3.87–5.11)

## 2012-12-09 LAB — BASIC METABOLIC PANEL
BUN: 21 mg/dL (ref 6–23)
Chloride: 102 mEq/L (ref 96–112)
GFR calc Af Amer: 49 mL/min — ABNORMAL LOW (ref >90)
Potassium: 2.9 mEq/L — ABNORMAL LOW (ref 3.5–5.1)
Sodium: 138 mEq/L (ref 135–145)

## 2012-12-09 MED ORDER — POTASSIUM CHLORIDE CRYS ER 20 MEQ PO TBCR
40.0000 meq | EXTENDED_RELEASE_TABLET | Freq: Once | ORAL | Status: AC
  Administered 2012-12-09: 40 meq via ORAL
  Filled 2012-12-09: qty 2

## 2012-12-09 MED ORDER — EZETIMIBE 10 MG PO TABS: 10.0000 mg | ORAL_TABLET | Freq: Every day | ORAL | Status: DC

## 2012-12-09 MED ORDER — QUETIAPINE FUMARATE ER 300 MG PO TB24: 300.0000 mg | ORAL_TABLET | Freq: Every day | ORAL | Status: DC

## 2012-12-09 MED ORDER — FUROSEMIDE 40 MG PO TABS: 40.0000 mg | ORAL_TABLET | Freq: Every day | ORAL | Status: DC

## 2012-12-09 MED ORDER — ALPRAZOLAM 2 MG PO TABS: 2.0000 mg | ORAL_TABLET | Freq: Three times a day (TID) | ORAL | Status: DC | PRN

## 2012-12-09 MED ORDER — ALBUTEROL SULFATE HFA 108 (90 BASE) MCG/ACT IN AERS: 2.0000 | INHALATION_SPRAY | Freq: Four times a day (QID) | RESPIRATORY_TRACT | Status: DC | PRN

## 2012-12-09 MED ORDER — BACLOFEN 10 MG PO TABS: 10.0000 mg | ORAL_TABLET | Freq: Two times a day (BID) | ORAL | Status: DC

## 2012-12-09 MED ORDER — FLUTICASONE PROPIONATE 50 MCG/ACT NA SUSP: 2.0000 | Freq: Every day | NASAL | Status: DC

## 2012-12-09 MED ORDER — BUDESONIDE-FORMOTEROL FUMARATE 160-4.5 MCG/ACT IN AERO: 2.0000 | INHALATION_SPRAY | Freq: Two times a day (BID) | RESPIRATORY_TRACT | Status: DC

## 2012-12-09 MED ORDER — POTASSIUM CHLORIDE 20 MEQ PO PACK: 20.0000 meq | PACK | Freq: Two times a day (BID) | ORAL | Status: DC

## 2012-12-09 MED ORDER — OMEPRAZOLE 20 MG PO CPDR: 20.0000 mg | DELAYED_RELEASE_CAPSULE | Freq: Two times a day (BID) | ORAL | Status: DC

## 2012-12-09 MED ORDER — HYDROMORPHONE HCL PF 1 MG/ML IJ SOLN: 1.0000 mg | Freq: Once | INTRAMUSCULAR | Status: DC

## 2012-12-09 MED ORDER — EPINEPHRINE 0.3 MG/0.3ML IJ SOAJ: 0.3000 mg | Freq: Once | INTRAMUSCULAR | Status: AC

## 2012-12-09 MED ORDER — HYDROMORPHONE HCL PF 1 MG/ML IJ SOLN
1.0000 mg | Freq: Once | INTRAMUSCULAR | Status: AC
  Administered 2012-12-09: 1 mg via INTRAVENOUS
  Filled 2012-12-09: qty 1

## 2012-12-09 NOTE — Progress Notes (Signed)
Patient here to establish care Has history she suffers from sever anxiety States does not take donnatal anymore-manufacturer discontinued

## 2012-12-09 NOTE — ED Notes (Signed)
Pt c/o HA on back of head x 1 week; pt sts hx of chronic pain; pt sts some falls recently

## 2012-12-09 NOTE — Progress Notes (Signed)
Patient ID: Connie Murray, female   DOB: 1971/02/07, 42 y.o.   MRN: [id number] Patient Demographics  Connie Murray, is a 43 y.o. female  [id number]  [id number]  DOB - 05-07-1971  Chief Complaint  Patient presents with  . Anxiety        Subjective:   Connie Murray today is here to establish primary care. Patient has No headache, No chest pain, No abdominal pain - No Nausea, No new weakness tingling or numbness, No Cough - SOB.  Patient is a 42 year old with multiple medical problems including GERD, asthma, chronic back pain, fibromyalgia, migraines (follows Dr. Anne Hahn). Patient used to follow Dr Rivka Spring at [facility] who provided her the pain management, parties currently retired. Patient does not want to go to his new partner Dr. Orvan Falconer as he refused to give her narcotics.    Objective:    Filed Vitals:   12/09/12 1621  BP: 117/84  Pulse: 111  Temp: 98.5 F (36.9 C)  Resp: 18  Weight: 141 lb (63.957 kg)  SpO2: 100%     ALLERGIES:   Allergies  Allergen Reactions  . Benadryl (Diphenhydramine Hcl) Other (See Comments)    Contains red dye, to which patient is allergic  . Diclofenac Other (See Comments)    Cause kidney and liver problems  . Statins Other (See Comments)    Caused liver problems  . Acetaminophen Other (See Comments)    Affects whole body; "makes me feel like crap"  . Codeine Itching    And a really, really bad headache   . Darvocet (Propoxyphene-Acetaminophen) Nausea And Vomiting  . Latex Dermatitis and Rash    "Occurs mostly only in groin area."  . Red Dye Itching and Rash    "Red streaks across throat, some itching"  . Tetracyclines & Related Nausea And Vomiting    "and severe stomach cramps"    PAST MEDICAL HISTORY: Past Medical History  Diagnosis Date  . GERD (gastroesophageal reflux disease)   . Anxiety   . Bipolar disorder   . Chronic headaches   . Fibromyalgia   . Chronic lower back pain   . Normocytic anemia   . Cervical  spondylosis   . Depression   . Migraine   . Radiculopathy     C8 or T1  . Degenerative arthritis   . Dyslipidemia   . Myalgia and myositis, unspecified 11/27/2012    PAST SURGICAL HISTORY: Past Surgical History  Procedure Laterality Date  . Total abdominal hysterectomy w/ bilateral salpingoophorectomy  01/2010  . Nasal sinus surgery      FAMILY HISTORY: Family History  Problem Relation Age of Onset  . Irritable bowel syndrome    . Ovarian cancer Mother   . Alcoholism Father   . Bone cancer Maternal Uncle     MEDICATIONS AT HOME: Prior to Admission medications   Medication Sig Start Date End Date Taking? Authorizing Provider  albuterol (PROVENTIL HFA;VENTOLIN HFA) 108 (90 BASE) MCG/ACT inhaler Inhale 2 puffs into the lungs every 6 (six) hours as needed for wheezing or shortness of breath. 12/09/12  Yes Ripudeep Jenna Luo, MD  alprazolam Prudy Feeler) 2 MG tablet Take 1 tablet (2 mg total) by mouth 3 (three) times daily as needed for anxiety. 12/09/12  Yes Ripudeep Jenna Luo, MD  Aspirin-Salicylamide-Caffeine (BC HEADACHE POWDER PO) Take 1 Package by mouth every 6 (six) hours as needed (pain).   Yes Historical Provider, MD  B Complex-C (B-COMPLEX WITH VITAMIN C) tablet Take 2 tablets by mouth  daily.    Yes Historical Provider, MD  baclofen (LIORESAL) 10 MG tablet Take 1 tablet (10 mg total) by mouth 2 (two) times daily. 12/09/12  Yes Ripudeep Jenna Luo, MD  budesonide-formoterol (SYMBICORT) 160-4.5 MCG/ACT inhaler Inhale 2 puffs into the lungs 2 (two) times daily. 12/09/12  Yes Ripudeep Jenna Luo, MD  cetirizine (ZYRTEC) 10 MG tablet Take 10 mg by mouth daily.   Yes Historical Provider, MD  DULoxetine (CYMBALTA) 30 MG capsule One capsule daily for 3 weeks then one capsule twice daily 11/29/12  Yes York Spaniel, MD  ezetimibe (ZETIA) 10 MG tablet Take 1 tablet (10 mg total) by mouth daily. 12/09/12  Yes Ripudeep Jenna Luo, MD  FLUoxetine (PROZAC) 20 MG capsule One capsule daily for 3 weeks, then stop 11/27/12   Yes York Spaniel, MD  fluticasone [facility]) 50 MCG/ACT nasal spray Place 2 sprays into the nose daily. 12/09/12  Yes Ripudeep Jenna Luo, MD  furosemide (LASIX) 40 MG tablet Take 1 tablet (40 mg total) by mouth daily. 12/09/12  Yes Ripudeep Jenna Luo, MD  ibuprofen (ADVIL,MOTRIN) 200 MG tablet Take 800 mg by mouth every 6 (six) hours as needed for pain.   Yes Historical Provider, MD  methadone (DOLOPHINE) 10 MG tablet Take 10 mg by mouth every 8 (eight) hours. Take up to 6 per day for pain   Yes Historical Provider, MD  omeprazole (PRILOSEC) 20 MG capsule Take 1 capsule (20 mg total) by mouth 2 (two) times daily. 12/09/12  Yes Ripudeep Jenna Luo, MD  potassium chloride (KLOR-CON) 20 MEQ packet Take 20 mEq by mouth 2 (two) times daily. 12/09/12  Yes Ripudeep Jenna Luo, MD  QUEtiapine (SEROQUEL XR) 300 MG 24 hr tablet Take 1 tablet (300 mg total) by mouth at bedtime. 12/09/12  Yes Ripudeep Jenna Luo, MD  rizatriptan (MAXALT) 10 MG tablet Take 10 mg by mouth as needed for migraine. May repeat in 24 hours if needed   Yes Historical Provider, MD  topiramate (TOPAMAX) 100 MG tablet Take 100 mg by mouth at bedtime.   Yes Historical Provider, MD  atropine-PHENObarbital-scopolamine-hyoscyamine (DONNATAL) 16.2 MG tablet Take 1 tablet by mouth as needed. For pain    Historical Provider, MD  EPINEPHrine (EPI-PEN) 0.3 mg/0.3 mL DEVI Inject 0.3 mg into the muscle once.    Historical Provider, MD  EPINEPHrine (EPIPEN) 0.3 mg/0.3 mL SOAJ Inject 0.3 mLs (0.3 mg total) into the muscle once. 12/09/12   Ripudeep Jenna Luo, MD  oxycodone (OXY-IR) 5 MG capsule Take 1 capsule (5 mg total) by mouth 2 (two) times daily as needed for pain (Must last 28 days.). 11/26/12   York Spaniel, MD    REVIEW OF SYSTEMS:  Constitutional:   No   Fevers, chills, fatigue.  HEENT:    No headaches, Sore throat,   Cardio-vascular: No chest pain,  Orthopnea, swelling in lower extremities, anasarca, palpitations  GI:  No abdominal pain, nausea, vomiting,  diarrhea  Resp: No shortness of breath,  No coughing up of blood.No cough.No wheezing.  Skin:  no rash or lesions.  GU:  no dysuria, change in color of urine, no urgency or frequency.  No flank pain.  Musculoskeletal: Complaining of chronic back pain, joint pains, fibromyalgia  Psych: No change in mood or affect. No depression or anxiety.  No memory loss.   Exam  General appearance :Awake, alert, NAD, Speech Clear. HEENT: Atraumatic and Normocephalic, PERLA Neck: supple, no JVD. No cervical lymphadenopathy.  Chest: clear to auscultation  bilaterally, no wheezing, rales or rhonchi CVS: S1 S2 regular, no murmurs.  Abdomen: soft, NBS, NT, ND, no gaurding, rigidity or rebound. Extremities: No cyanosis, clubbing, B/L Lower Ext shows no edema,  Neurology: Awake alert, and oriented X 3, CN II-XII intact, Non focal Skin:No Rash or lesions Wounds: N/A    Data Review      Assessment & Plan   Active Problems: 1. chronic back pain issues with fibromyalgia, heels spurs - He should also follows Dr. Anne Hahn, she recently had MRI of the lumbosacral spine which was relatively unremarkable showing only mild arthritic changes, per Dr Anne Hahn no indication for any surgery. - I strongly counseled the patient that she needs to be referred to the pain management and I will not be prescribing methadone or any narcotics or their refills - Ambulatory refill to pain management clinic sent   2. bipolar disorder - All the changes to her psychiatry medications done by Dr. Anne Hahn reviewed. I also told her that I will now only 30 tablets of Xanax and seroquel this visit she is running out of the refills until she's is seen by psychiatry within this month.   3. GERD: refilled prilosec  4. Asthma: refilled Symbicort, albuterol inhaler  Recommendations: Follow with pain management clinic and psychiatry  Follow-up in 3 months  RAI,RIPUDEEP M.D. 12/09/2012, 5:17 PM

## 2012-12-10 ENCOUNTER — Encounter: Payer: Self-pay | Admitting: Neurology

## 2012-12-10 ENCOUNTER — Telehealth: Payer: Self-pay | Admitting: Internal Medicine

## 2012-12-10 ENCOUNTER — Telehealth: Payer: Self-pay | Admitting: Neurology

## 2012-12-10 DIAGNOSIS — S139XXS Sprain of joints and ligaments of unspecified parts of neck, sequela: Secondary | ICD-10-CM

## 2012-12-10 DIAGNOSIS — M47812 Spondylosis without myelopathy or radiculopathy, cervical region: Secondary | ICD-10-CM

## 2012-12-10 NOTE — Telephone Encounter (Signed)
Pt was here yesterday for office visit and wondering if she left behind a bottle of alprazolam Prudy Feeler), which she brought to show Dr.

## 2012-12-10 NOTE — Telephone Encounter (Signed)
This encounter was created in error - please disregard.

## 2012-12-10 NOTE — Telephone Encounter (Signed)
I spoke to patient who said she went to the ER last night because her pain is so bad, it goes from the back of her head almost to the bottom of her ribs.  The ER doctor told her she can't keep coming to the ER for pain management, they told her to ask Dr. Anne Hahn and request Percocet since they are not able to prescribe it.  She said she is running out of options, but she saw at doctor yesterday at [facility] and they referred her to a Pain Management center, when she read me the name the appointment was with [place].  She got confused and I told her to check with the doctor she saw yesterday to see if the referral had been made.  She proceeded to go on and on about her situation, and I told her I would forward her concerns to Dr. Anne Hahn.

## 2012-12-10 NOTE — ED Provider Notes (Signed)
CSN: [id number]     Arrival date & time 12/09/12  1858 History     First MD Initiated Contact with Patient 12/09/12 2003     Chief Complaint  Patient presents with  . Headache   (Consider location/radiation/quality/duration/timing/severity/associated sxs/prior Treatment) HPI Comments: 42 y.o. Female with PMHx of chronic pain and anxiety presents today for pain management. Pt states that she has cervicalgia and thoracic and back pain which sometimes manifests gradual onset posterior headache. Review of records shows pt has been treated with both IM dilaudid and PO meds in the ED in the recent past. Homero Fellers discussion with pt reveals that pt has long hx of chronic pain and adversarial relationship with her various doctors including her primary care, an orthopedist, a neurologist, and pain management clinic. Pt states she is trying to establish new care, but is having trouble. She is here seeking relief of pain. Pt has no new somatic complaints today and endorses that her pain is quite similar to her chronic pain. Denies new injury, fever, focal deficits, generalized weakness, visual disturbances, shortness of breath, chest pain, numbness. No red flags for back pain including no loss of bowel or bladder control, no night sweats, no unintentional weight loss, no h/o cancer, IVDU.    Back Pain Location:  Thoracic spine, lumbar spine and sacro-iliac joint Quality:  Aching Radiates to:  Does not radiate Pain severity:  Moderate Pain is:  Same all the time Onset quality:  Gradual Timing:  Constant Progression:  Unchanged Chronicity:  Chronic Context: not lifting heavy objects and not recent injury   Relieved by:  Nothing Worsened by:  Nothing tried Ineffective treatments:  5-325 percocet, twice a day Associated symptoms: no leg pain and no perianal numbness   Neck Pain Associated symptoms: no leg pain     Patient is a 42 y.o. female presenting with headaches.  Headache Associated symptoms:  back pain and neck pain   Associated symptoms: no diarrhea, no dizziness, no fever, no nausea, no neck stiffness, no numbness and no vomiting     Past Medical History  Diagnosis Date  . GERD (gastroesophageal reflux disease)   . Anxiety   . Bipolar disorder   . Chronic headaches   . Fibromyalgia   . Chronic lower back pain   . Normocytic anemia   . Cervical spondylosis   . Depression   . Migraine   . Radiculopathy     C8 or T1  . Degenerative arthritis   . Dyslipidemia   . Myalgia and myositis, unspecified 11/27/2012   Past Surgical History  Procedure Laterality Date  . Total abdominal hysterectomy w/ bilateral salpingoophorectomy  01/2010  . Nasal sinus surgery     Family History  Problem Relation Age of Onset  . Irritable bowel syndrome    . Ovarian cancer Mother   . Alcoholism Father   . Bone cancer Maternal Uncle    History  Substance Use Topics  . Smoking status: Current Every Day Smoker -- 1.00 packs/day for 28 years    Types: Cigarettes  . Smokeless tobacco: Never Used  . Alcohol Use: No   OB History   Grav Para Term Preterm Abortions TAB SAB Ect Mult Living                 Review of Systems  Constitutional: Negative for fever and diaphoresis.  HENT: Positive for neck pain. Negative for neck stiffness.        Chronic  Eyes: Negative for visual disturbance.  Respiratory: Negative for apnea, chest tightness and shortness of breath.   Cardiovascular: Negative for chest pain and palpitations.  Gastrointestinal: Negative for nausea, vomiting, diarrhea and constipation.  Genitourinary: Negative for dysuria.  Musculoskeletal: Positive for back pain. Negative for gait problem.       Chronic  Skin: Negative for rash.  Neurological: Positive for headaches. Negative for dizziness, weakness, light-headedness and numbness.    Allergies  Benadryl; Diclofenac; Statins; Acetaminophen; Codeine; Darvocet; Latex; Red dye; and Tetracyclines & related  Home Medications    Current Outpatient Rx  Name  Route  Sig  Dispense  Refill  . albuterol (PROVENTIL HFA;VENTOLIN HFA) 108 (90 BASE) MCG/ACT inhaler   Inhalation   Inhale 2 puffs into the lungs every 6 (six) hours as needed for wheezing or shortness of breath.   8.5 g   3   . alprazolam (XANAX) 2 MG tablet   Oral   Take 1 tablet (2 mg total) by mouth 3 (three) times daily as needed for anxiety.   30 tablet   0   . Aspirin-Salicylamide-Caffeine (BC HEADACHE POWDER PO)   Oral   Take 1 Package by mouth every 6 (six) hours as needed (pain).         Marland Kitchen atropine-PHENObarbital-scopolamine-hyoscyamine (DONNATAL) 16.2 MG tablet   Oral   Take 1 tablet by mouth as needed. For pain         . B Complex-C (B-COMPLEX WITH VITAMIN C) tablet   Oral   Take 2 tablets by mouth daily.          . baclofen (LIORESAL) 10 MG tablet   Oral   Take 1 tablet (10 mg total) by mouth 2 (two) times daily.   60 each   3   . budesonide-formoterol (SYMBICORT) 160-4.5 MCG/ACT inhaler   Inhalation   Inhale 2 puffs into the lungs 2 (two) times daily.   1 Inhaler   3   . cetirizine (ZYRTEC) 10 MG tablet   Oral   Take 10 mg by mouth daily.         . DULoxetine (CYMBALTA) 30 MG capsule      One capsule daily for 3 weeks then one capsule twice daily   180 capsule   1     **Patient requests 90 days supply**   . EPINEPHrine (EPIPEN) 0.3 mg/0.3 mL SOAJ   Intramuscular   Inject 0.3 mLs (0.3 mg total) into the muscle once.   1 Device   5   . ezetimibe (ZETIA) 10 MG tablet   Oral   Take 1 tablet (10 mg total) by mouth daily.   30 tablet   3   . FLUoxetine (PROZAC) 20 MG capsule      One capsule daily for 3 weeks, then stop   21 capsule   0   . fluticasone (FLONASE) 50 MCG/ACT nasal spray   Nasal   Place 2 sprays into the nose daily.   16 g   3   . furosemide (LASIX) 40 MG tablet   Oral   Take 1 tablet (40 mg total) by mouth daily.   30 tablet   3   . ibuprofen (ADVIL,MOTRIN) 200 MG tablet    Oral   Take 800 mg by mouth every 6 (six) hours as needed for pain.         . methadone (DOLOPHINE) 10 MG tablet   Oral   Take 10 mg by mouth every 8 (eight) hours. Take up to 6 per  day for pain         . omeprazole (PRILOSEC) 20 MG capsule   Oral   Take 1 capsule (20 mg total) by mouth 2 (two) times daily.   60 capsule   3   . oxycodone (OXY-IR) 5 MG capsule   Oral   Take 1 capsule (5 mg total) by mouth 2 (two) times daily as needed for pain (Must last 28 days.).   60 capsule   0   . potassium chloride (KLOR-CON) 20 MEQ packet   Oral   Take 20 mEq by mouth 2 (two) times daily.   60 packet   3   . QUEtiapine (SEROQUEL XR) 300 MG 24 hr tablet   Oral   Take 1 tablet (300 mg total) by mouth at bedtime.   30 tablet   0   . rizatriptan (MAXALT) 10 MG tablet   Oral   Take 10 mg by mouth as needed for migraine. May repeat in 24 hours if needed         . topiramate (TOPAMAX) 100 MG tablet   Oral   Take 100 mg by mouth at bedtime.          BP 111/82  Pulse 91  Temp(Src) 97.8 F (36.6 C) (Oral)  Resp 20  SpO2 95% Physical Exam  Nursing note and vitals reviewed. Constitutional: She is oriented to person, place, and time. She appears well-developed and well-nourished. No distress.  HENT:  Head: Normocephalic and atraumatic.  Eyes: Conjunctivae and EOM are normal.  Neck: Normal range of motion. Neck supple.  No meningeal signs  Cardiovascular: Normal rate, regular rhythm and normal heart sounds.  Exam reveals no gallop and no friction rub.   No murmur heard. Pulmonary/Chest: Effort normal and breath sounds normal. No respiratory distress. She has no wheezes. She has no rales. She exhibits no tenderness.  Abdominal: Soft. Bowel sounds are normal. She exhibits no distension. There is no tenderness. There is no rebound and no guarding.  Musculoskeletal: Normal range of motion. She exhibits tenderness. She exhibits no edema.  FROM to upper and lower extremities No  step-offs noted on C-spine No tenderness to palpation of the spinous processes of the C-spine, T-spine or L-spine Full range of motion of C-spine, T-spine or L-spine Mild tenderness to palpation of the cervical and lumbar paraspinous muscles   Neurological: She is alert and oriented to person, place, and time. No cranial nerve deficit.  Speech is clear and goal oriented, follows commands Sensation normal to light touch and two point discrimination Moves extremities without ataxia, coordination intact Normal gait and balance Normal strength in upper and lower extremities bilaterally including dorsiflexion and plantar flexion, strong and equal grip strength   Skin: Skin is warm and dry. She is not diaphoretic. No erythema.  Psychiatric:  argumentative     ED Course   Procedures (including critical care time)  Labs Reviewed  CBC WITH DIFFERENTIAL - Abnormal; Notable for the following:    MCHC 36.2 (*)    All other components within normal limits  BASIC METABOLIC PANEL - Abnormal; Notable for the following:    Potassium 2.9 (*)    Glucose, Bld 100 (*)    Creatinine, Ser 1.46 (*)    GFR calc non Af Amer 43 (*)    GFR calc Af Amer 49 (*)    All other components within normal limits   No results found. 1. Chronic pain   2. Headache   3. Hypokalemia  MDM  No new injury. No neurological deficits and normal neuro exam.  Patient can walk but states is painful.  No loss of bowel or bladder control.  No concern for cauda equina.  No fever, night sweats, weight loss, h/o cancer, IVDU.  RICE protocol and pain medicine alternative reviewed with the patient.  Discussed chronic pain management policy of the ED and that she would not be receiving a prescription today. Also will include in her discharge papers.  At this time there does not appear to be any evidence of an acute emergency medical condition and the patient appears stable for discharge with appropriate outpatient follow  up.Diagnosis was discussed with patient who verbalizes understanding and is agreeable to discharge.   Glade Nurse, PA-C 12/10/12 2308

## 2012-12-10 NOTE — Telephone Encounter (Signed)
No bottle found, called pt.  She had left it in her car.

## 2012-12-10 NOTE — Telephone Encounter (Signed)
I called the patient. The patient has gone to the emergency room for ongoing pain. The patient is having neck pain, and some shooting pain in the back of the head that could represent occipital neuralgia. The patient wants more narcotics, I do not believe that we should continue to do this. The patient was found to have a potassium level 2.9, and she is on potassium supplementation. This may need to be increased to reduce muscle spasms. I do not wish to go up on the narcotic medications. The patient will be set up for an epidural steroid injection of the cervical spine, and she will undergo physical therapy for neuromuscular therapy of the cervical spine and shoulders.

## 2012-12-12 ENCOUNTER — Ambulatory Visit (INDEPENDENT_AMBULATORY_CARE_PROVIDER_SITE_OTHER): Payer: Medicare Other | Admitting: Family Medicine

## 2012-12-12 VITALS — BP 100/69 | HR 97 | Temp 98.2°F | Resp 18 | Ht 61.0 in | Wt 140.0 lb

## 2012-12-12 DIAGNOSIS — J029 Acute pharyngitis, unspecified: Secondary | ICD-10-CM

## 2012-12-12 DIAGNOSIS — K219 Gastro-esophageal reflux disease without esophagitis: Secondary | ICD-10-CM

## 2012-12-12 LAB — POCT CBC
HCT, POC: 43.2 % (ref 37.7–47.9)
Lymph, poc: 1.6 (ref 0.6–3.4)
MCH, POC: 31.7 pg — AB (ref 27–31.2)
MCHC: 31.9 g/dL (ref 31.8–35.4)
MCV: 99.3 fL — AB (ref 80–97)
POC LYMPH PERCENT: 20.5 %L (ref 10–50)
RDW, POC: 13.5 %

## 2012-12-12 LAB — POCT RAPID STREP A (OFFICE): Rapid Strep A Screen: NEGATIVE

## 2012-12-12 NOTE — Progress Notes (Signed)
[address]   [place], [place]  [address]   [phone]  Subjective:    Patient ID: Connie Murray, female    DOB: 07/30/1970, 42 y.o.   MRN: [id number]  HPI This 42 y.o. female presents for evaluation of sore throat, allergies.  Three weeks of dysphagia, choking on food.  Sinuses dry; bloody nose; +rhinorrhea mild.  Flonase use.  Woke up this morning, with sore throat.  Has been a little sore yesterday.  Using vapor cigarette which chokes patient for past day.  No fever but +sweats.  History of cervical DDD with severe pain.  No recent xray of cervical spine.  Constant neck pain and headache.  Has neurologist and neurosurgeon; neurologist prescribed 60 tablets of oxycodone.  Chronic pain.  Struggling with pain.  Pain not under good control.  Three head injuries in past six months.  History of chronic tonsillitis.  Choking is better for the past week.   Was taking Prilosec bid; switched to Zantac for a while; has returned back to Prilosec; this may have contributed to worsening GERD and choking.     Review of Systems  Constitutional: Positive for diaphoresis. Negative for fever, chills and fatigue.  HENT: Positive for sore throat, trouble swallowing, neck pain, voice change, postnasal drip and tinnitus. Negative for ear pain, congestion, rhinorrhea and ear discharge.   Respiratory: Negative for cough and shortness of breath.   Gastrointestinal: Negative for nausea, vomiting and diarrhea.  Skin: Negative for rash.  Neurological: Negative for headaches.    Past Medical History  Diagnosis Date  . GERD (gastroesophageal reflux disease)   . Anxiety   . Bipolar disorder   . Chronic headaches   . Fibromyalgia   . Chronic lower back pain   . Normocytic anemia   . Cervical spondylosis   . Depression   . Migraine   . Radiculopathy     C8 or T1  . Degenerative arthritis   . Dyslipidemia   . Myalgia and myositis, unspecified 11/27/2012    Past Surgical History  Procedure Laterality Date    . Total abdominal hysterectomy w/ bilateral salpingoophorectomy  01/2010  . Nasal sinus surgery      Prior to Admission medications   Medication Sig Start Date End Date Taking? Authorizing Provider  albuterol (PROVENTIL HFA;VENTOLIN HFA) 108 (90 BASE) MCG/ACT inhaler Inhale 2 puffs into the lungs every 6 (six) hours as needed for wheezing or shortness of breath. 12/09/12  Yes Ripudeep Jenna Luo, MD  alprazolam Prudy Feeler) 2 MG tablet Take 1 tablet (2 mg total) by mouth 3 (three) times daily as needed for anxiety. 12/09/12  Yes Ripudeep Jenna Luo, MD  Aspirin-Salicylamide-Caffeine (BC HEADACHE POWDER PO) Take 1 Package by mouth every 6 (six) hours as needed (pain).   Yes Historical Provider, MD  B Complex-C (B-COMPLEX WITH VITAMIN C) tablet Take 2 tablets by mouth daily.    Yes Historical Provider, MD  baclofen (LIORESAL) 10 MG tablet Take 1 tablet (10 mg total) by mouth 2 (two) times daily. 12/09/12  Yes Ripudeep Jenna Luo, MD  budesonide-formoterol (SYMBICORT) 160-4.5 MCG/ACT inhaler Inhale 2 puffs into the lungs 2 (two) times daily. 12/09/12  Yes Ripudeep Jenna Luo, MD  cetirizine (ZYRTEC) 10 MG tablet Take 10 mg by mouth daily.   Yes Historical Provider, MD  DULoxetine (CYMBALTA) 30 MG capsule One capsule daily for 3 weeks then one capsule twice daily 11/29/12  Yes York Spaniel, MD  EPINEPHrine (EPIPEN) 0.3 mg/0.3 mL SOAJ Inject  0.3 mLs (0.3 mg total) into the muscle once. 12/09/12  Yes Ripudeep Jenna Luo, MD  ezetimibe (ZETIA) 10 MG tablet Take 1 tablet (10 mg total) by mouth daily. 12/09/12  Yes Ripudeep Jenna Luo, MD  FLUoxetine (PROZAC) 20 MG capsule One capsule daily for 3 weeks, then stop 11/27/12  Yes York Spaniel, MD  fluticasone [facility]) 50 MCG/ACT nasal spray Place 2 sprays into the nose daily. 12/09/12  Yes Ripudeep Jenna Luo, MD  furosemide (LASIX) 40 MG tablet Take 1 tablet (40 mg total) by mouth daily. 12/09/12  Yes Ripudeep Jenna Luo, MD  omeprazole (PRILOSEC) 20 MG capsule Take 1 capsule (20 mg total) by mouth 2  (two) times daily. 12/09/12  Yes Ripudeep Jenna Luo, MD  oxycodone (OXY-IR) 5 MG capsule Take 1 capsule (5 mg total) by mouth 2 (two) times daily as needed for pain (Must last 28 days.). 11/26/12  Yes York Spaniel, MD  potassium chloride (KLOR-CON) 20 MEQ packet Take 20 mEq by mouth 2 (two) times daily. 12/09/12  Yes Ripudeep Jenna Luo, MD  QUEtiapine (SEROQUEL XR) 300 MG 24 hr tablet Take 1 tablet (300 mg total) by mouth at bedtime. 12/09/12  Yes Ripudeep Jenna Luo, MD  rizatriptan (MAXALT) 10 MG tablet Take 10 mg by mouth as needed for migraine. May repeat in 24 hours if needed   Yes Historical Provider, MD  topiramate (TOPAMAX) 100 MG tablet Take 100 mg by mouth at bedtime.   Yes Historical Provider, MD  ibuprofen (ADVIL,MOTRIN) 200 MG tablet Take 800 mg by mouth every 6 (six) hours as needed for pain.    Historical Provider, MD  methadone (DOLOPHINE) 10 MG tablet Take 10 mg by mouth every 8 (eight) hours. Take up to 6 per day for pain    Historical Provider, MD    Allergies  Allergen Reactions  . Benadryl (Diphenhydramine Hcl) Other (See Comments)    Contains red dye, to which patient is allergic  . Diclofenac Other (See Comments)    Cause kidney and liver problems  . Statins Other (See Comments)    Caused liver problems  . Nsaids     Affects kidney and liver  . Acetaminophen Other (See Comments)    Affects whole body; "makes me feel like crap"  . Codeine Itching    And a really, really bad headache   . Darvocet (Propoxyphene-Acetaminophen) Nausea And Vomiting  . Latex Dermatitis and Rash    "Occurs mostly only in groin area."  . Red Dye Itching and Rash    "Red streaks across throat, some itching"  . Tetracyclines & Related Nausea And Vomiting    "and severe stomach cramps"    History   Social History  . Marital Status: Single    Spouse Name: N/A    Number of Children: 1  . Years of Education: N/A   Occupational History  . Disabled    Social History Main Topics  . Smoking  status: Former Smoker -- 1.00 packs/day for 28 years    Types: Cigarettes    Start date: 12/13/1982    Quit date: 07/15/2012  . Smokeless tobacco: Never Used     Comment: using vapor cigarette   . Alcohol Use: No  . Drug Use: No  . Sexually Active: Not on file   Other Topics Concern  . Not on file   Social History Narrative  . No narrative on file    Family History  Problem Relation Age of Onset  .  Irritable bowel syndrome    . Ovarian cancer Mother   . Alcoholism Father   . Bone cancer Maternal Uncle        Objective:   Physical Exam  Nursing note and vitals reviewed. Constitutional: She is oriented to person, place, and time. She appears well-developed and well-nourished. No distress.  HENT:  Head: Normocephalic and atraumatic.  Right Ear: External ear normal.  Left Ear: External ear normal.  Nose: Nose normal.  Mouth/Throat: Oropharynx is clear and moist. No oropharyngeal exudate.  Drainage in posterior pharynx.  Eyes: Conjunctivae and EOM are normal. Pupils are equal, round, and reactive to light.  Neck: Neck supple. No thyromegaly present.  Cardiovascular: Normal rate, regular rhythm and normal heart sounds.   Pulmonary/Chest: Effort normal and breath sounds normal.  Lymphadenopathy:    She has no cervical adenopathy.  Neurological: She is alert and oriented to person, place, and time.  Skin: Skin is warm and dry. No rash noted. She is not diaphoretic.  Psychiatric: She has a normal mood and affect. Her behavior is normal.   Results for orders placed in visit on 12/12/12  POCT RAPID STREP A (OFFICE)      Result Value Range   Rapid Strep A Screen Negative  Negative  POCT CBC      Result Value Range   WBC 7.8  4.6 - 10.2 K/uL   Lymph, poc 1.6  0.6 - 3.4   POC LYMPH PERCENT 20.5  10 - 50 %L   MID (cbc) 0.6  0 - 0.9   POC MID % 8.0  0 - 12 %M   POC Granulocyte 5.6  2 - 6.9   Granulocyte percent 71.5  37 - 80 %G   RBC 4.35  4.04 - 5.48 M/uL   Hemoglobin  13.8  12.2 - 16.2 g/dL   HCT, POC 16.1  09.6 - 47.9 %   MCV 99.3 (*) 80 - 97 fL   MCH, POC 31.7 (*) 27 - 31.2 pg   MCHC 31.9  31.8 - 35.4 g/dL   RDW, POC 04.5     Platelet Count, POC 292  142 - 424 K/uL   MPV 8.9  0 - 99.8 fL       Assessment & Plan:  Acute pharyngitis - Plan: POCT rapid strep A, POCT CBC, Culture, Group A Strep  1.  Acute pharyngitis:  New.  Rapid strep negative.  Send throat culture.  Supportive care with Ibuprofen or Tylenol, gargles.  RTC inability to swallow.  No orders of the defined types were placed in this encounter.

## 2012-12-12 NOTE — Patient Instructions (Addendum)
Sore Throat A sore throat is pain, burning, irritation, or scratchiness of the throat. There is often pain or tenderness when swallowing or talking. A sore throat may be accompanied by other symptoms, such as coughing, sneezing, fever, and swollen neck glands. A sore throat is often the first sign of another sickness, such as a cold, flu, strep throat, or mononucleosis (commonly known as mono). Most sore throats go away without medical treatment. CAUSES  The most common causes of a sore throat include:  A viral infection, such as a cold, flu, or mono.  A bacterial infection, such as strep throat, tonsillitis, or whooping cough.  Seasonal allergies.  Dryness in the air.  Irritants, such as smoke or pollution.  Gastroesophageal reflux disease (GERD). HOME CARE INSTRUCTIONS   Only take over-the-counter medicines as directed by your caregiver.  Drink enough fluids to keep your urine clear or pale yellow.  Rest as needed.  Try using throat sprays, lozenges, or sucking on hard candy to ease any pain (if older than 4 years or as directed).  Sip warm liquids, such as broth, herbal tea, or warm water with honey to relieve pain temporarily. You may also eat or drink cold or frozen liquids such as frozen ice pops.  Gargle with salt water (mix 1 tsp salt with 8 oz of water).  Do not smoke and avoid secondhand smoke.  Put a cool-mist humidifier in your bedroom at night to moisten the air. You can also turn on a hot shower and sit in the bathroom with the door closed for 5 10 minutes. SEEK IMMEDIATE MEDICAL CARE IF:  You have difficulty breathing.  You are unable to swallow fluids, soft foods, or your saliva.  You have increased swelling in the throat.  Your sore throat does not get better in 7 days.  You have nausea and vomiting.  You have a fever or persistent symptoms for more than 2 3 days.  You have a fever and your symptoms suddenly get worse. MAKE SURE YOU:   Understand  these instructions.  Will watch your condition.  Will get help right away if you are not doing well or get worse. Document Released: 06/13/2004 Document Revised: 04/22/2012 Document Reviewed: 01/12/2012 [employer] Patient Information 2014 [employer].  

## 2012-12-14 ENCOUNTER — Emergency Department (HOSPITAL_COMMUNITY)
Admission: EM | Admit: 2012-12-14 | Discharge: 2012-12-14 | Disposition: A | Discharge status: Home / Self Care | Payer: Medicare Other | Attending: Emergency Medicine | Admitting: Emergency Medicine

## 2012-12-14 ENCOUNTER — Emergency Department (HOSPITAL_COMMUNITY): Payer: Medicare Other

## 2012-12-14 ENCOUNTER — Encounter (HOSPITAL_COMMUNITY): Payer: Self-pay | Admitting: Emergency Medicine

## 2012-12-14 DIAGNOSIS — E785 Hyperlipidemia, unspecified: Secondary | ICD-10-CM | POA: Insufficient documentation

## 2012-12-14 DIAGNOSIS — R110 Nausea: Secondary | ICD-10-CM | POA: Insufficient documentation

## 2012-12-14 DIAGNOSIS — Z91040 Latex allergy status: Secondary | ICD-10-CM | POA: Insufficient documentation

## 2012-12-14 DIAGNOSIS — Z87891 Personal history of nicotine dependence: Secondary | ICD-10-CM | POA: Insufficient documentation

## 2012-12-14 DIAGNOSIS — G8929 Other chronic pain: Secondary | ICD-10-CM | POA: Insufficient documentation

## 2012-12-14 DIAGNOSIS — K219 Gastro-esophageal reflux disease without esophagitis: Secondary | ICD-10-CM | POA: Insufficient documentation

## 2012-12-14 DIAGNOSIS — D649 Anemia, unspecified: Secondary | ICD-10-CM | POA: Insufficient documentation

## 2012-12-14 DIAGNOSIS — IMO0002 Reserved for concepts with insufficient information to code with codable children: Secondary | ICD-10-CM | POA: Insufficient documentation

## 2012-12-14 DIAGNOSIS — M542 Cervicalgia: Secondary | ICD-10-CM | POA: Insufficient documentation

## 2012-12-14 DIAGNOSIS — R51 Headache: Secondary | ICD-10-CM | POA: Insufficient documentation

## 2012-12-14 DIAGNOSIS — Z888 Allergy status to other drugs, medicaments and biological substances status: Secondary | ICD-10-CM | POA: Insufficient documentation

## 2012-12-14 DIAGNOSIS — Z79899 Other long term (current) drug therapy: Secondary | ICD-10-CM | POA: Insufficient documentation

## 2012-12-14 DIAGNOSIS — M47812 Spondylosis without myelopathy or radiculopathy, cervical region: Secondary | ICD-10-CM | POA: Insufficient documentation

## 2012-12-14 DIAGNOSIS — Z8669 Personal history of other diseases of the nervous system and sense organs: Secondary | ICD-10-CM | POA: Insufficient documentation

## 2012-12-14 DIAGNOSIS — M549 Dorsalgia, unspecified: Secondary | ICD-10-CM | POA: Insufficient documentation

## 2012-12-14 DIAGNOSIS — M1990 Unspecified osteoarthritis, unspecified site: Secondary | ICD-10-CM | POA: Insufficient documentation

## 2012-12-14 DIAGNOSIS — IMO0001 Reserved for inherently not codable concepts without codable children: Secondary | ICD-10-CM | POA: Insufficient documentation

## 2012-12-14 DIAGNOSIS — Z885 Allergy status to narcotic agent status: Secondary | ICD-10-CM | POA: Insufficient documentation

## 2012-12-14 DIAGNOSIS — F319 Bipolar disorder, unspecified: Secondary | ICD-10-CM | POA: Insufficient documentation

## 2012-12-14 DIAGNOSIS — F411 Generalized anxiety disorder: Secondary | ICD-10-CM | POA: Insufficient documentation

## 2012-12-14 MED ORDER — DIPHENHYDRAMINE HCL 25 MG PO CAPS
50.0000 mg | ORAL_CAPSULE | Freq: Once | ORAL | Status: DC
  Filled 2012-12-14: qty 1

## 2012-12-14 MED ORDER — DEXAMETHASONE SODIUM PHOSPHATE 10 MG/ML IJ SOLN
10.0000 mg | Freq: Once | INTRAMUSCULAR | Status: DC
  Filled 2012-12-14: qty 1

## 2012-12-14 MED ORDER — METOCLOPRAMIDE HCL 10 MG PO TABS
10.0000 mg | ORAL_TABLET | Freq: Once | ORAL | Status: AC
  Administered 2012-12-14: 10 mg via ORAL
  Filled 2012-12-14 (×2): qty 1

## 2012-12-14 NOTE — ED Notes (Addendum)
Pain in head neck and spine feels like elecetricity she states has been seen here before for same, states that she had light salad before she came, states pain mx dr has dropped her. Pain is constant wants someone to do an xray

## 2012-12-14 NOTE — ED Notes (Signed)
Pt very dramatic, c/o neck pain and headache since a fall 1 month ago. States she is supposed to only take two OxyIR per day but she has been taking up to 4 per day. Sees a neurologist who has done MRI's prior to the fall but not since. Pt requesting xrays.

## 2012-12-14 NOTE — ED Provider Notes (Signed)
CSN: [id number]     Arrival date & time 12/14/12  1258 History    This chart was scribed for non-physician practitioner Junious Silk PA-C, working with Bonnita Levan. Bernette Mayers, MD by Donne Anon, ED Scribe. This patient was seen in room TR06C/TR06C and the patient's care was started at 1505.   First MD Initiated Contact with Patient 12/14/12 1505     Chief Complaint  Patient presents with  . Headache    The history is provided by the patient. No language interpreter was used.   HPI Comments: Connie Murray is a 42 y.o. female with hx of chronic pain, who presents to the Emergency Department complaining of gradual onset, non changing, chronic, moderate posterior HA that radiates down the right side of her neck and back. She describes the pain as if "someone shot me.". Flexion and extension of her neck worsens the pain. Keeping her spine straight makes the pain better. She states the pain originated a while ago when she hit her head. She has tried Maxalt and Topamax with no relief. She has seen a neurologist, Dr. Isidoro Donning, for this pain. She reports associated nausea. She denies vomiting, fever, chills,   Past Medical History  Diagnosis Date  . GERD (gastroesophageal reflux disease)   . Anxiety   . Bipolar disorder   . Chronic headaches   . Fibromyalgia   . Chronic lower back pain   . Normocytic anemia   . Cervical spondylosis   . Depression   . Migraine   . Radiculopathy     C8 or T1  . Degenerative arthritis   . Dyslipidemia   . Myalgia and myositis, unspecified 11/27/2012   Past Surgical History  Procedure Laterality Date  . Total abdominal hysterectomy w/ bilateral salpingoophorectomy  01/2010  . Nasal sinus surgery     Family History  Problem Relation Age of Onset  . Irritable bowel syndrome    . Ovarian cancer Mother   . Alcoholism Father   . Bone cancer Maternal Uncle    History  Substance Use Topics  . Smoking status: Former Smoker -- 1.00 packs/day for 28 years    Types:  Cigarettes    Start date: 12/13/1982    Quit date: 07/15/2012  . Smokeless tobacco: Never Used     Comment: using vapor cigarette   . Alcohol Use: No   OB History   Grav Para Term Preterm Abortions TAB SAB Ect Mult Living                 Review of Systems  Constitutional: Negative for fever and chills.  HENT: Positive for neck pain.   Gastrointestinal: Positive for nausea. Negative for vomiting.  Musculoskeletal: Positive for back pain.  Neurological: Positive for headaches.  All other systems reviewed and are negative.    Allergies  Bee venom; Molds & smuts; Benadryl; Diclofenac; Statins; Nsaids; Acetaminophen; Codeine; Darvocet; Latex; Red dye; and Tetracyclines & related  Home Medications   Current Outpatient Rx  Name  Route  Sig  Dispense  Refill  . albuterol (PROVENTIL HFA;VENTOLIN HFA) 108 (90 BASE) MCG/ACT inhaler   Inhalation   Inhale 2 puffs into the lungs every 6 (six) hours as needed for wheezing or shortness of breath.   8.5 g   3   . alprazolam (XANAX) 2 MG tablet   Oral   Take 1 tablet (2 mg total) by mouth 3 (three) times daily as needed for anxiety.   30 tablet   0   .  B Complex-C (B-COMPLEX WITH VITAMIN C) tablet   Oral   Take 2 tablets by mouth daily.          . baclofen (LIORESAL) 10 MG tablet   Oral   Take 1 tablet (10 mg total) by mouth 2 (two) times daily.   60 each   3   . budesonide-formoterol (SYMBICORT) 160-4.5 MCG/ACT inhaler   Inhalation   Inhale 2 puffs into the lungs 2 (two) times daily.   1 Inhaler   3   . cetirizine (ZYRTEC) 10 MG tablet   Oral   Take 10 mg by mouth daily.         . DULoxetine (CYMBALTA) 30 MG capsule   Oral   Take 30 mg by mouth daily. One capsule daily for 3 weeks then one capsule twice daily         . estrogens, conjugated, (PREMARIN) 1.25 MG tablet   Oral   Take 1.25 mg by mouth daily.         Marland Kitchen ezetimibe (ZETIA) 10 MG tablet   Oral   Take 1 tablet (10 mg total) by mouth daily.   30  tablet   3   . FLUoxetine (PROZAC) 20 MG capsule   Oral   Take 20 mg by mouth daily. One capsule daily for 3 weeks, then stop         . fluticasone (FLONASE) 50 MCG/ACT nasal spray   Nasal   Place 2 sprays into the nose daily. Uses 4 times a week.         . furosemide (LASIX) 40 MG tablet   Oral   Take 1 tablet (40 mg total) by mouth daily.   30 tablet   3   . ibuprofen (ADVIL,MOTRIN) 200 MG tablet   Oral   Take 800 mg by mouth once.          Marland Kitchen omeprazole (PRILOSEC) 20 MG capsule   Oral   Take 1 capsule (20 mg total) by mouth 2 (two) times daily.   60 capsule   3   . oxycodone (OXY-IR) 5 MG capsule   Oral   Take 1 capsule (5 mg total) by mouth 2 (two) times daily as needed for pain (Must last 28 days.).   60 capsule   0   . potassium chloride SA (K-DUR,KLOR-CON) 20 MEQ tablet   Oral   Take 40 mEq by mouth daily.         . QUEtiapine (SEROQUEL XR) 300 MG 24 hr tablet   Oral   Take 1 tablet (300 mg total) by mouth at bedtime.   30 tablet   0   . rizatriptan (MAXALT) 10 MG tablet   Oral   Take 10 mg by mouth as needed for migraine. May repeat in 24 hours if needed         . topiramate (TOPAMAX) 100 MG tablet   Oral   Take 100 mg by mouth at bedtime.         Marland Kitchen EPINEPHrine (EPIPEN) 0.3 mg/0.3 mL SOAJ   Intramuscular   Inject 0.3 mLs (0.3 mg total) into the muscle once.   1 Device   5    BP 105/85  Pulse 100  Temp(Src) 99 F (37.2 C)  Resp 16  SpO2 96%  Physical Exam  Nursing note and vitals reviewed. Constitutional: She is oriented to person, place, and time. She appears well-developed and well-nourished. No distress.  Very dramatic. Tearful.  HENT:  Head: Normocephalic and atraumatic.  Right Ear: External ear normal.  Left Ear: External ear normal.  Nose: Nose normal.  Mouth/Throat: Oropharynx is clear and moist.  Eyes: Conjunctivae, EOM and lids are normal. Pupils are equal, round, and reactive to light.  Neck: Normal range of  motion.  Cardiovascular: Normal rate, regular rhythm, normal heart sounds and intact distal pulses.   Pulmonary/Chest: Effort normal and breath sounds normal. No stridor. No respiratory distress. She has no wheezes. She has no rales.  Abdominal: Soft. She exhibits no distension.  Musculoskeletal: Normal range of motion.  Tenderness to palpation along cervical spine. Strength 5/5. Moves all 4 extremities without guarding. Distal pulses intact.   Neurological: She is alert and oriented to person, place, and time. She has normal strength. No sensory deficit. Coordination and gait normal.  Skin: Skin is warm and dry. She is not diaphoretic. No erythema.  Psychiatric: She has a normal mood and affect. Her behavior is normal.    ED Course   Procedures (including critical care time) DIAGNOSTIC STUDIES: Oxygen Saturation is 96% on RA, adequate by my interpretation.    COORDINATION OF CARE: 3:26 PM Discussed treatment plan which includes c spine xray, Reglan, Benadryl, and Decadron with pt at bedside and pt agreed to plan.   4:33 PM Rechecked pt. Discussed imaging results. Will discharge home.  Labs Reviewed - No data to display Dg Cervical Spine Complete  12/14/2012   *RADIOLOGY REPORT*  Clinical Data: Pain with questionable trauma  CERVICAL SPINE - COMPLETE 4+ VIEW  Comparison: None.  Findings:  Frontal, lateral, and mouth odontoid, and bilateral oblique views were obtained.  There is no fracture or spondylolisthesis.  Prevertebral soft tissues and predental space regions are normal.  Disc spaces appear unremarkable except for mild disc space narrowing anteriorly at C5-6.  There is no appreciable facet arthropathy on the oblique views. There are small anterior osteophytes at C5 and C6.  There is slight reversal lordotic curvature.  IMPRESSION: Mild osteoarthritic change.  No fracture or spondylolisthesis.  There is mild reversal lordotic curvature.  This finding is probably due to muscle spasm.  If  there is concern for ligamentous injury, however, lateral flexion and extension images could be helpful to further assess.   Original Report Authenticated By: Bretta Bang, M.D.   1. Headache     MDM  Patient with headache x 1 month. Has seen neurologist and been evaluated for this previously. XR shows no fracture or spondylolisthesis. Neuro exam normal, no deficits. No concern for meningitis, SAH, temporal arteritis. Patient in no distress at time of discharge, walking swiftly out of emergency department in high heeled boots. She will follow up with her PCP this week for better pain controlled. No narcotics given. Vital signs stable for discharge. Patient / Family / Caregiver informed of clinical course, understand medical decision-making process, and agree with plan.   I personally performed the services described in this documentation, which was scribed in my presence. The recorded information has been reviewed and is accurate.     Mora Bellman, PA-C 12/14/12 1659

## 2012-12-14 NOTE — ED Notes (Signed)
Pt states she is unable to take Benadryl because it makes her "ill". States she is unable to take Decadron because "it shuts my kidneys down". PA notified.

## 2012-12-14 NOTE — ED Provider Notes (Signed)
Medical screening examination/treatment/procedure(s) were performed by non-physician practitioner and as supervising physician I was immediately available for consultation/collaboration.  Charles B. Sheldon, MD 12/14/12 2213 

## 2012-12-14 NOTE — ED Notes (Signed)
Pt does not answer when called 

## 2012-12-15 ENCOUNTER — Encounter (HOSPITAL_COMMUNITY): Payer: Self-pay | Admitting: Emergency Medicine

## 2012-12-15 NOTE — ED Provider Notes (Signed)
Medical screening examination/treatment/procedure(s) were performed by non-physician practitioner and as supervising physician I was immediately available for consultation/collaboration.   Carleene Cooper III, MD 12/15/12 [phone]

## 2012-12-16 ENCOUNTER — Other Ambulatory Visit: Payer: Self-pay | Admitting: Neurology

## 2012-12-16 DIAGNOSIS — M47812 Spondylosis without myelopathy or radiculopathy, cervical region: Secondary | ICD-10-CM

## 2012-12-22 ENCOUNTER — Ambulatory Visit
Admission: RE | Admit: 2012-12-22 | Discharge: 2012-12-22 | Disposition: A | Discharge status: Home / Self Care | Payer: Medicare Other | Source: Ambulatory Visit | Attending: Neurology | Admitting: Neurology

## 2012-12-22 DIAGNOSIS — M47812 Spondylosis without myelopathy or radiculopathy, cervical region: Secondary | ICD-10-CM

## 2012-12-22 MED ORDER — IOHEXOL 300 MG/ML  SOLN
1.0000 mL | Freq: Once | INTRAMUSCULAR | Status: AC | PRN
  Administered 2012-12-22: 1 mL via EPIDURAL

## 2012-12-22 MED ORDER — TRIAMCINOLONE ACETONIDE 40 MG/ML IJ SUSP (RADIOLOGY)
60.0000 mg | Freq: Once | INTRAMUSCULAR | Status: AC
  Administered 2012-12-22: 60 mg via EPIDURAL

## 2012-12-23 ENCOUNTER — Other Ambulatory Visit: Payer: Self-pay | Admitting: Neurology

## 2012-12-23 MED ORDER — OXYCODONE HCL 5 MG PO CAPS: 5.0000 mg | ORAL_CAPSULE | Freq: Two times a day (BID) | ORAL | Status: DC | PRN

## 2013-01-13 ENCOUNTER — Telehealth: Payer: Self-pay | Admitting: Neurology

## 2013-01-19 ENCOUNTER — Telehealth: Payer: Self-pay | Admitting: Neurology

## 2013-01-19 MED ORDER — OXYCODONE HCL 5 MG PO CAPS: 5.0000 mg | ORAL_CAPSULE | Freq: Four times a day (QID) | ORAL | Status: DC | PRN

## 2013-01-19 NOTE — Telephone Encounter (Signed)
Rx is ready for pick up.  I have already spoken to the patient, she is aware.

## 2013-01-19 NOTE — Telephone Encounter (Signed)
Left message for patient to see what medication she wants to increase.  Most likely her pain medication. [phone]

## 2013-01-19 NOTE — Telephone Encounter (Signed)
I called patient. She was simply: To get a refill on her oxycodone. As we have discussed previously, my plans are to go down on the number of tablets that she gets monthly, and eventually taper her off of the medication completely. I will go down to 50 tablets a month.

## 2013-02-17 ENCOUNTER — Ambulatory Visit: Payer: Medicare Other | Attending: Neurology | Admitting: Physical Therapy

## 2013-02-17 DIAGNOSIS — IMO0001 Reserved for inherently not codable concepts without codable children: Secondary | ICD-10-CM | POA: Insufficient documentation

## 2013-02-17 DIAGNOSIS — M542 Cervicalgia: Secondary | ICD-10-CM | POA: Insufficient documentation

## 2013-02-23 ENCOUNTER — Ambulatory Visit: Payer: Medicare Other | Admitting: Physical Therapy

## 2013-02-25 ENCOUNTER — Ambulatory Visit: Payer: Medicare Other | Admitting: Physical Therapy

## 2013-02-26 ENCOUNTER — Telehealth: Payer: Self-pay | Admitting: Internal Medicine

## 2013-02-26 NOTE — Telephone Encounter (Signed)
Pt is calling in today to get a refill on script QUEtiapine (SEROQUEL XR) 300 MG 24 hr tablet; pt mentioned that she goes through hard withdrawals when she does not have medication; please call pt to advise

## 2013-02-26 NOTE — Telephone Encounter (Signed)
Pt requesting med refill

## 2013-03-02 ENCOUNTER — Telehealth: Payer: Self-pay | Admitting: Emergency Medicine

## 2013-03-02 ENCOUNTER — Telehealth: Payer: Self-pay | Admitting: Internal Medicine

## 2013-03-02 NOTE — Telephone Encounter (Signed)
Spoke with pt regarding medication refill Seroquel. Informed pt she needs to schedule office visit for further treatment.pt states referral to [facility] phychiatric discontinued treatment and told to continue medication.

## 2013-03-02 NOTE — Telephone Encounter (Signed)
Pt requesting script refill Seroquel. States she needs medicine to live. Please address

## 2013-03-02 NOTE — Telephone Encounter (Signed)
Pt called regarding her medication QUEtiapine (SEROQUEL XR) 300 MG 24 hr tablet, pt would like to know why the doctor refuse to refill her medication, please contact pt

## 2013-03-03 NOTE — Telephone Encounter (Signed)
Patient was referred to psychiatry and will need to follow-up with them for psychiatry medications. Patient is also followed by Dr Anne Hahn who has been managing her pain meds and overlapping medications between physicians can lead to polypharmacy. She will need schedule appointment to address any refills.

## 2013-03-05 ENCOUNTER — Telehealth: Payer: Self-pay | Admitting: Emergency Medicine

## 2013-03-05 ENCOUNTER — Other Ambulatory Visit: Payer: Self-pay | Admitting: Internal Medicine

## 2013-03-05 MED ORDER — QUETIAPINE FUMARATE ER 300 MG PO TB24: 300.0000 mg | ORAL_TABLET | Freq: Every day | ORAL | Status: DC

## 2013-03-05 NOTE — Telephone Encounter (Signed)
Spoke with Dr. Hyman Hopes. Script e-scribed to [id number] supply until next appt. 03/12/13 Pt aware

## 2013-03-05 NOTE — Telephone Encounter (Signed)
Spoke with pt regarding medication refill. States she was never called with psychiatry referral. Pt is no longer seeing Dr. Anne Hahn for pain meds. Pt goes to [facility] on [address]. Pt has office appt 03/12/13

## 2013-03-09 ENCOUNTER — Ambulatory Visit: Payer: Medicare Other | Admitting: Physical Therapy

## 2013-03-09 ENCOUNTER — Ambulatory Visit: Payer: Medicare Other | Attending: Internal Medicine | Admitting: Internal Medicine

## 2013-03-09 ENCOUNTER — Encounter (INDEPENDENT_AMBULATORY_CARE_PROVIDER_SITE_OTHER): Payer: Self-pay

## 2013-03-09 ENCOUNTER — Encounter: Payer: Self-pay | Admitting: Internal Medicine

## 2013-03-09 VITALS — BP 101/67 | HR 90 | Temp 97.8°F | Resp 16 | Ht 62.0 in | Wt 147.0 lb

## 2013-03-09 DIAGNOSIS — Z23 Encounter for immunization: Secondary | ICD-10-CM

## 2013-03-09 DIAGNOSIS — F319 Bipolar disorder, unspecified: Secondary | ICD-10-CM | POA: Insufficient documentation

## 2013-03-09 MED ORDER — OMEPRAZOLE 20 MG PO CPDR: 20.0000 mg | DELAYED_RELEASE_CAPSULE | Freq: Two times a day (BID) | ORAL | Status: DC

## 2013-03-09 MED ORDER — FLUOXETINE HCL 20 MG PO CAPS: 20.0000 mg | ORAL_CAPSULE | Freq: Every day | ORAL | Status: DC

## 2013-03-09 MED ORDER — BUDESONIDE-FORMOTEROL FUMARATE 160-4.5 MCG/ACT IN AERO: 2.0000 | INHALATION_SPRAY | Freq: Two times a day (BID) | RESPIRATORY_TRACT | Status: DC

## 2013-03-09 MED ORDER — QUETIAPINE FUMARATE ER 300 MG PO TB24: 300.0000 mg | ORAL_TABLET | Freq: Every day | ORAL | Status: DC

## 2013-03-09 MED ORDER — ALBUTEROL SULFATE HFA 108 (90 BASE) MCG/ACT IN AERS: 2.0000 | INHALATION_SPRAY | Freq: Four times a day (QID) | RESPIRATORY_TRACT | Status: DC | PRN

## 2013-03-09 MED ORDER — ALPRAZOLAM 2 MG PO TABS: 2.0000 mg | ORAL_TABLET | Freq: Three times a day (TID) | ORAL | Status: DC | PRN

## 2013-03-09 NOTE — Progress Notes (Signed)
Pt is here for a f/u visit. Pt is requesting for a medication refill. Pt is requesting to have a liver function test.

## 2013-03-09 NOTE — Progress Notes (Signed)
Patient ID: Connie Murray, female   DOB: [birth year], 42 y.o.   MRN: [id number]   History of present illness 42 year old female with history of fibromyalgia, bipolar disorder, chronic asthma, migraine headaches, GERD and chronic back pain here for followup. she reports followed at the pain clinic and also seeing her neurologist Dr. Anne Murray. She was also referred to psychiatry regarding her bipolar disorder and medications. She reports seeing psychiatry at [facility] and was told that she does not need any therapy and continue on the medications. She continues to have headache and back pains. She denies any dizziness blurred vision, chest pain, palpitations, shortness of breath, abdominal pain, nausea, vomiting, bowel or urinary symptoms.  Vital signs in last 24 hours:  Filed Vitals:   03/09/13 1416  BP: 101/67  Pulse: 90  Temp: 97.8 F (36.6 C)  TempSrc: Oral  Resp: 16  Height: 5\' 2"  (1.575 m)  Weight: 147 lb (66.679 kg)  SpO2: 93%      Physical Exam:  General: Middle aged female in no acute distress. HEENT: no pallor, no icterus, moist oral mucosa,  Heart: Normal  s1 &s2  Regular rate and rhythm, without murmurs, rubs, gallops. Lungs: Clear to auscultation bilaterally. Abdomen: Soft, nontender, nondistended, positive bowel sounds. Extremities: Warm, no edema Neuro: Alert, awake, oriented x3, nonfocal.   Lab Results:  Basic Metabolic Panel:    Component Value Date/Time   NA 138 12/09/2012 2013   K 2.9* 12/09/2012 2013   CL 102 12/09/2012 2013   CO2 21 12/09/2012 2013   BUN 21 12/09/2012 2013   CREATININE 1.46* 12/09/2012 2013   GLUCOSE 100* 12/09/2012 2013   CALCIUM 9.3 12/09/2012 2013   CBC:    Component Value Date/Time   WBC 7.8 12/12/2012 1137   WBC 7.7 12/09/2012 2013   HGB 13.8 12/12/2012 1137   HGB 13.5 12/09/2012 2013   HCT 43.2 12/12/2012 1137   HCT 37.3 12/09/2012 2013   PLT 279 12/09/2012 2013   MCV 99.3* 12/12/2012 1137   MCV 91.0 12/09/2012 2013   NEUTROABS  4.4 12/09/2012 2013   LYMPHSABS 2.5 12/09/2012 2013   MONOABS 0.7 12/09/2012 2013   EOSABS 0.1 12/09/2012 2013   BASOSABS 0.0 12/09/2012 2013    No results found for this or any previous visit (from the past 240 hour(s)).  Studies/Results: No results found.  Medications: Scheduled Meds: Continuous Infusions: PRN Meds:.    Assessment/Plan:  Bipolar disorder Reports seeing psychiatry at that he needed immediate will the field for Prozac, Seroquel and Xanax ( 30 tablets only). She needs to follow up with psychiatry regarding those adjustment. Denies any stress at present.  Chronic migraines She has been following with Dr. Anne Murray but wants to see a different neurologist. I instructed her to follow up with Dr. Anne Murray as he has been seen for a long time but insists on seeing a different neurologist. Connie Murray make an outpatient referral.  Transaminitis Noted on labs 8 mths back. We'll repeat a apprehensive metabolic panel to check her liver function and potassium level  health maintenance Refer to GYN for PAP and mammogram Ordered flu vaccine     Followup in 3 months Connie Murray 03/09/2013, 2:43 PM

## 2013-03-10 LAB — COMPREHENSIVE METABOLIC PANEL
ALT: 13 U/L (ref 0–35)
BUN: 19 mg/dL (ref 6–23)
CO2: 21 mEq/L (ref 19–32)
Calcium: 9.3 mg/dL (ref 8.4–10.5)
Chloride: 107 mEq/L (ref 96–112)
Creat: 1.41 mg/dL — ABNORMAL HIGH (ref 0.50–1.10)
Glucose, Bld: 79 mg/dL (ref 70–99)

## 2013-03-11 ENCOUNTER — Ambulatory Visit: Payer: Medicare Other | Admitting: Physical Therapy

## 2013-03-12 ENCOUNTER — Other Ambulatory Visit: Payer: Self-pay | Admitting: *Deleted

## 2013-03-12 ENCOUNTER — Ambulatory Visit: Payer: Medicare Other

## 2013-03-16 ENCOUNTER — Ambulatory Visit (INDEPENDENT_AMBULATORY_CARE_PROVIDER_SITE_OTHER): Payer: Medicare Other | Admitting: Nurse Practitioner

## 2013-03-16 ENCOUNTER — Encounter: Payer: Self-pay | Admitting: Nurse Practitioner

## 2013-03-16 VITALS — BP 105/73 | HR 101 | Ht 63.0 in | Wt 147.0 lb

## 2013-03-16 DIAGNOSIS — IMO0001 Reserved for inherently not codable concepts without codable children: Secondary | ICD-10-CM

## 2013-03-16 DIAGNOSIS — G43909 Migraine, unspecified, not intractable, without status migrainosus: Secondary | ICD-10-CM

## 2013-03-16 DIAGNOSIS — M797 Fibromyalgia: Secondary | ICD-10-CM

## 2013-03-16 MED ORDER — TOPIRAMATE 100 MG PO TABS: 100.0000 mg | ORAL_TABLET | Freq: Every day | ORAL | Status: DC

## 2013-03-16 MED ORDER — GABAPENTIN 100 MG PO CAPS: 100.0000 mg | ORAL_CAPSULE | Freq: Every day | ORAL | Status: DC

## 2013-03-16 MED ORDER — RIZATRIPTAN BENZOATE 10 MG PO TABS: 10.0000 mg | ORAL_TABLET | ORAL | Status: DC | PRN

## 2013-03-16 NOTE — Progress Notes (Signed)
[facility]  PATIENT: Connie Murray DOB: [birth year]   REASON FOR VISIT: migraines and fibromyalgia    HISTORY OF PRESENT ILLNESS: Connie Murray, 42 year old returns for followup. She was last seen by Dr. Anne Hahn 11/27/2012. When  seen by Dr. Anne Hahn she was on no medications for her fibromyalgia that has been approved by the FDA. She was on methadone and oxycodone. She claims she had blackouts on Lyrica in the past. She was placed on  Cymbalta by Dr. Anne Hahn but she only took the medicine for several weeks and she claims it made her dizzy. She was getting a taper of her OxyContin when  last seen by him but now she tells me she is being seen by pain management again. She is complaining today of a headache in the occipital region, pins and needles sensation. She is currently on Topamax 100 daily and Maxalt acutely. She claims she has been referred to physical therapy for her fibromyalgia.    HISTORY: The patient has been seen through a pain center previously, and her primary physician was writing medications for oxycodone and for methadone. The patient is on no medications that have been FDA approved for fibromyalgia. In the past, she took gabapentin which was not helpful, and she indicates that Lyrica caused blackouts. The patient has never been on Cymbalta or Savella. The patient is on Prozac taking 40 mg daily. The patient indicates that she has essentially total body pain, with pain in the neck, shoulders, neck, elbows, wrists, low back, hips, thighs, knees, and feet. The patient has crepitus in the shoulder joints. The patient has had MRI evaluation of the cervical spine that showed some spondylosis without nerve root impingement at the C5-6 and C6-7 levels. Epidural steroid injections in the past have been helpful. The patient has been on diclofenac previously, but this resulted in renal insufficiency. The patient comes to this office for an evaluation    REVIEW OF SYSTEMS: Full 14  system review of systems performed and notable only for:  Constitutional: Fatigue  Cardiovascular: N/A  Ear/Nose/Throat: Ringing in the ears  Skin: N/A  Eyes: Blurred vision  Respiratory: N/A  Gastroitestinal: N/A  Hematology/Lymphatic: N/A  Endocrine: N/A Musculoskeletal: Joint pain  Allergy/Immunology: Allergies Neurological: Headache, numbness, tingling  Psychiatric: Depression anxiety   ALLERGIES: Allergies  Allergen Reactions  . Bee Venom Anaphylaxis  . Molds & Smuts Shortness Of Breath  . Benadryl [Diphenhydramine Hcl] Other (See Comments)    Contains red dye, to which patient is allergic  . Diclofenac Other (See Comments)    Cause kidney and liver problems  . Nsaids Other (See Comments)    Affects kidney and liver  . Statins Other (See Comments)    Caused liver problems  . Acetaminophen Other (See Comments)    Affects whole body; "makes me feel like crap"  . Codeine Itching    And a really, really bad headache   . Darvocet [Propoxyphene-Acetaminophen] Nausea And Vomiting  . Latex Dermatitis and Rash    "Occurs mostly only in groin area."  . Red Dye Itching and Rash    "Red streaks across throat, some itching"  . Tetracyclines & Related Nausea And Vomiting    "and severe stomach cramps"    HOME MEDICATIONS: Outpatient Prescriptions Prior to Visit  Medication Sig Dispense Refill  . albuterol (PROVENTIL HFA;VENTOLIN HFA) 108 (90 BASE) MCG/ACT inhaler Inhale 2 puffs into the lungs every 6 (six) hours as needed for wheezing or shortness of breath.  8.5 g  3  . B Complex-C (B-COMPLEX WITH VITAMIN C) tablet Take 2 tablets by mouth daily.       . baclofen (LIORESAL) 10 MG tablet Take 1 tablet (10 mg total) by mouth 2 (two) times daily.  60 each  3  . budesonide-formoterol (SYMBICORT) 160-4.5 MCG/ACT inhaler Inhale 2 puffs into the lungs 2 (two) times daily.  1 Inhaler  3  . cetirizine (ZYRTEC) 10 MG tablet Take 10 mg by mouth daily.      Marland Kitchen EPINEPHrine (EPIPEN) 0.3  mg/0.3 mL SOAJ Inject 0.3 mLs (0.3 mg total) into the muscle once.  1 Device  5  . estrogens, conjugated, (PREMARIN) 1.25 MG tablet Take 1.25 mg by mouth daily.      Marland Kitchen ezetimibe (ZETIA) 10 MG tablet Take 1 tablet (10 mg total) by mouth daily.  30 tablet  3  . FLUoxetine (PROZAC) 20 MG capsule Take 1 capsule (20 mg total) by mouth daily. One capsule daily for 3 weeks, then stop  30 capsule  2  . fluticasone (FLONASE) 50 MCG/ACT nasal spray Place 2 sprays into the nose daily. Uses 4 times a week.      . furosemide (LASIX) 40 MG tablet Take 1 tablet (40 mg total) by mouth daily.  30 tablet  3  . KLOR-CON 20 MEQ packet       . omeprazole (PRILOSEC) 20 MG capsule Take 1 capsule (20 mg total) by mouth 2 (two) times daily.  60 capsule  3  . oxycodone (OXY-IR) 5 MG capsule Take 1 capsule (5 mg total) by mouth every 6 (six) hours as needed for pain (Must last 28 days.).  50 capsule  0  . potassium chloride SA (K-DUR,KLOR-CON) 20 MEQ tablet Take 20 mEq by mouth every 8 (eight) hours. bid      . QUEtiapine (SEROQUEL XR) 300 MG 24 hr tablet Take 1 tablet (300 mg total) by mouth at bedtime.  30 tablet  2  . rizatriptan (MAXALT) 10 MG tablet Take 10 mg by mouth as needed for migraine. May repeat in 24 hours if needed      . topiramate (TOPAMAX) 100 MG tablet Take 100 mg by mouth at bedtime.      Marland Kitchen alprazolam (XANAX) 2 MG tablet Take 1 tablet (2 mg total) by mouth 3 (three) times daily as needed for anxiety.  30 tablet  0  . DULoxetine (CYMBALTA) 30 MG capsule Take 30 mg by mouth daily. One capsule daily for 3 weeks then one capsule twice daily      . ibuprofen (ADVIL,MOTRIN) 200 MG tablet Take 800 mg by mouth once.        No facility-administered medications prior to visit.    PAST MEDICAL HISTORY: Past Medical History  Diagnosis Date  . GERD (gastroesophageal reflux disease)   . Anxiety   . Bipolar disorder   . Chronic headaches   . Fibromyalgia   . Chronic lower back pain   . Normocytic anemia   .  Cervical spondylosis   . Depression   . Migraine   . Radiculopathy     C8 or T1  . Degenerative arthritis   . Dyslipidemia   . Myalgia and myositis, unspecified 11/27/2012    PAST SURGICAL HISTORY: Past Surgical History  Procedure Laterality Date  . Total abdominal hysterectomy w/ bilateral salpingoophorectomy  01/2010  . Nasal sinus surgery      FAMILY HISTORY: Family History  Problem Relation Age of Onset  .  Irritable bowel syndrome    . Ovarian cancer Mother   . Alcoholism Father   . Bone cancer Maternal Uncle     SOCIAL HISTORY: History   Social History  . Marital Status: Single    Spouse Name: N/A    Number of Children: 1  . Years of Education: N/A   Occupational History  . Disabled    Social History Main Topics  . Smoking status: Former Smoker -- 1.00 packs/day for 28 years    Types: Cigarettes    Start date: 12/13/1982    Quit date: 07/15/2012  . Smokeless tobacco: Never Used     Comment: using vapor cigarette   . Alcohol Use: No  . Drug Use: No  . Sexual Activity: Not on file   Other Topics Concern  . Not on file   Social History Narrative  . No narrative on file     PHYSICAL EXAM  Filed Vitals:   03/16/13 1518  BP: 105/73  Pulse: 101  Height: 5\' 3"  (1.6 m)  Weight: 147 lb (66.679 kg)   Body mass index is 26.05 kg/(m^2).  Generalized: Well developed, in no acute distress   Neurological examination   Mentation: Alert oriented to time, place, history taking. Follows all commands speech and language fluent  Cranial nerve II-XII: Pupils were equal round reactive to light extraocular movements were full, visual field were full on confrontational test. Facial sensation and strength were normal. hearing was intact to finger rubbing bilaterally. Uvula tongue midline. head turning and shoulder shrug and were normal and symmetric.Tongue protrusion into cheek strength was normal. Motor: normal bulk and tone, full strength in the BUE, BLE, fine  finger movements normal,  No focal weakness Coordination: finger-nose-finger, heel-to-shin bilaterally, no dysmetria Reflexes: Brachioradialis 2/2, biceps 2/2, triceps 2/2, patellar 2/2, Achilles 2/2, plantar responses were flexor bilaterally. Gait and Station: Rising up from seated position without assistance, normal stance,  moderate stride, good arm swing, smooth turning, able to perform tiptoe, and heel walking without difficulty. Tandem gait steady   DIAGNOSTIC DATA (LABS, IMAGING, TESTING) - I reviewed patient records, labs, notes, testing and imaging myself where available.  Lab Results  Component Value Date   WBC 7.8 12/12/2012   HGB 13.8 12/12/2012   HCT 43.2 12/12/2012   MCV 99.3* 12/12/2012   PLT 279 12/09/2012      Component Value Date/Time   NA 140 03/09/2013 1444   K 3.9 03/09/2013 1444   CL 107 03/09/2013 1444   CO2 21 03/09/2013 1444   GLUCOSE 79 03/09/2013 1444   BUN 19 03/09/2013 1444   CREATININE 1.41* 03/09/2013 1444   CREATININE 1.46* 12/09/2012 2013   CALCIUM 9.3 03/09/2013 1444   PROT 7.3 03/09/2013 1444   ALBUMIN 4.4 03/09/2013 1444   AST 19 03/09/2013 1444   ALT 13 03/09/2013 1444   ALKPHOS 91 03/09/2013 1444   BILITOT 0.3 03/09/2013 1444   GFRNONAA 43* 12/09/2012 2013   GFRAA 49* 12/09/2012 2013    ASSESSMENT AND PLAN  42 y.o. year old female  has a past medical history  Anxiety; Bipolar disorder; Chronic headaches; Fibromyalgia; Chronic lower back pain;  Cervical spondylosis; Depression; Migraine;  Degenerative arthritis; here to follow up. Her fibromyalgia is now being treated by pain management and physical therapy. She will no longer be getting narcotics from this office. She is currently on Topamax and Maxalt. Will add gabapentin for the  occipital paresthesias.   Will continue Topamax 100 mg daily Will refill Continue  Maxalt acutely will refill Trial low-dose gabapentin 100 mg at bedtime for one week then increase to 2 tabs for one week and 3 at  bedtime. Stay at that dose  followup in 4 months  Nilda Riggs, [facility], [facility], APRN  [facility] [address] [place], [place] [address] [phone]

## 2013-03-16 NOTE — Progress Notes (Signed)
I have read the note, and I agree with the clinical assessment and plan.  Jeanette Rauth KEITH   

## 2013-03-16 NOTE — Patient Instructions (Signed)
Will continue Topamax 100 mg daily Will refill Continue Maxalt acutely will refill Trial low-dose gabapentin 100 mg at bedtime for one week then increase to 2 tabs for one week and 3 at bedtime. Stay at that dose  followup in 4 months

## 2013-03-30 ENCOUNTER — Ambulatory Visit: Payer: Medicare Other | Admitting: Nurse Practitioner

## 2013-04-06 ENCOUNTER — Telehealth: Payer: Self-pay | Admitting: Internal Medicine

## 2013-04-06 NOTE — Telephone Encounter (Signed)
Pt calling about results for labs taken on 03/09/13 and has not received anything back.  She also would like a copy of results.  Pt says she has not heard back about psych referral and is running out of Xanax.  She has already had a psych eval and need refill.  Please f/u with pt.

## 2013-04-09 ENCOUNTER — Other Ambulatory Visit: Payer: Self-pay

## 2013-04-09 MED ORDER — ALPRAZOLAM 2 MG PO TABS: 2.0000 mg | ORAL_TABLET | Freq: Every evening | ORAL | Status: DC | PRN

## 2013-04-09 NOTE — Telephone Encounter (Signed)
Patient came to office today Received her lab results and prescription for xanax

## 2013-04-09 NOTE — Telephone Encounter (Signed)
Pt came in today to see if she can obtain her results for her bloodwork on 03/09/13; Pt is also requesting a referral for psychiatry and xanex medication; i explained to the pt the narcotics policy, however someone may need to speak with her directly.

## 2013-07-19 ENCOUNTER — Ambulatory Visit: Payer: Medicare Other | Admitting: Nurse Practitioner

## 2013-09-20 ENCOUNTER — Ambulatory Visit (INDEPENDENT_AMBULATORY_CARE_PROVIDER_SITE_OTHER): Payer: Medicare Other | Admitting: Gastroenterology

## 2013-09-20 ENCOUNTER — Encounter: Payer: Self-pay | Admitting: Gastroenterology

## 2013-09-20 VITALS — BP 84/60 | HR 68 | Ht 61.25 in | Wt 170.0 lb

## 2013-09-20 DIAGNOSIS — R140 Abdominal distension (gaseous): Secondary | ICD-10-CM

## 2013-09-20 DIAGNOSIS — R143 Flatulence: Secondary | ICD-10-CM

## 2013-09-20 DIAGNOSIS — R141 Gas pain: Secondary | ICD-10-CM

## 2013-09-20 DIAGNOSIS — K5900 Constipation, unspecified: Secondary | ICD-10-CM

## 2013-09-20 DIAGNOSIS — R142 Eructation: Secondary | ICD-10-CM

## 2013-09-20 MED ORDER — LINACLOTIDE 290 MCG PO CAPS: 290.0000 ug | ORAL_CAPSULE | Freq: Every day | ORAL | Status: DC

## 2013-09-20 NOTE — Progress Notes (Signed)
    History of Present Illness: This is a 43 year old female with chronic constipation which is not well-controlled. She has had lifelong constipation which has worsened over the past 5-10 years. She has tried numerous laxatives over the past several years including herbal laxatives, Ducolax, Correctal, Ex-Lax, magnesium containing laxatives, Colace, Peri-Colace and fiber supplements. She occasionally notes a small amount of bright red blood on the tissue paper with bowel movements. She underwent colonoscopy in 2012 which showed small benign ulcerations on the ileocecal valve and the hepatic flexure felt secondary to NSAIDs. CT scan in April 2014 showed a large stool burden, prior hysterectomy and a tiny supraumbilical fat-containing ventral hernia.  Current Medications, Allergies, Past Medical History, Past Surgical History, Family History and Social History were reviewed in [facility] electronic medical record.  Physical Exam: General: Well developed , well nourished, no acute distress Head: Normocephalic and atraumatic Eyes:  sclerae anicteric, EOMI Ears: Normal auditory acuity Mouth: No deformity or lesions Lungs: Clear throughout to auscultation Heart: Regular rate and rhythm; no murmurs, rubs or bruits Abdomen: Soft, non tender and non distended. No masses, hepatosplenomegaly or hernias noted. Normal Bowel sounds Musculoskeletal: Symmetrical with no gross deformities  Pulses:  Normal pulses noted Extremities: No clubbing, cyanosis, edema or deformities noted Neurological: Alert oriented x 4, grossly nonfocal Psychological:  Alert and cooperative. Normal mood and affect  Assessment and Recommendations:  1. Chronic constipation, exacerbated by narcotic usage. Suspected benign anorectal source of bleeding-likely has  hemorrhoids. Trial of Linzess 290 mcg daily. High fiber diet with adequate daily water intake.

## 2013-09-20 NOTE — Patient Instructions (Signed)
Start samples of Linzess 290 mcg one tablet by mouth once daily. A prescription has been sent to your pharmacy.   Call back if your symptoms fail to improve.  Thank you for choosing me and [facility].  Venita LickMalcolm T. Pleas KochStark, Jr., MD., Clementeen GrahamFACG

## 2013-09-30 ENCOUNTER — Telehealth: Payer: Self-pay

## 2013-09-30 MED ORDER — LUBIPROSTONE 24 MCG PO CAPS: 24.0000 ug | ORAL_CAPSULE | Freq: Two times a day (BID) | ORAL | Status: DC

## 2013-09-30 NOTE — Telephone Encounter (Signed)
Called patient's insurance company to do PA on [employer] and [employer] faxed the denial for PA back today stating that patient has to try the preferred medication first which is Amitiza. Called patient to make sure patient has not tried Amitiza prior to this and patient has states she has not and is willing to switch for better insurance coverage. Amitiza sent to patient's pharmacy.

## 2013-10-25 ENCOUNTER — Other Ambulatory Visit: Payer: Self-pay | Admitting: Internal Medicine

## 2013-10-27 ENCOUNTER — Other Ambulatory Visit: Payer: Self-pay | Admitting: Internal Medicine

## 2013-12-31 ENCOUNTER — Telehealth: Payer: Self-pay | Admitting: Gastroenterology

## 2013-12-31 NOTE — Telephone Encounter (Signed)
Patient states she is extremely nauseated when taking Amitiza and would like to be switched back to Linzess. Asked patient if she has been taking the Amitiza with food and patient states she has tried taking it with food and without but still is very sick. Reminded patient that her insurance will not cover the Linzess but she can pay out of pocket if she still wants to be switched back to Linzess. Pt states she is hoping that the insurance will cover a 15 day supply so she can atleast take it every other day. I told patient will send this note to Dr. Russella DarStark to make sure he is fine with switching back to Linzess. Please advise if you want her to go back on 290mcg or try the 145mcg instead?

## 2014-01-01 NOTE — Telephone Encounter (Signed)
linzess 290 mcg daily and she could try it qod if not covered

## 2014-01-03 MED ORDER — LINACLOTIDE 290 MCG PO CAPS: 290.0000 ug | ORAL_CAPSULE | ORAL | Status: DC

## 2014-01-03 NOTE — Telephone Encounter (Signed)
Sent prescription for Linzess 290 mch to take one tablet by mouth every other day.

## 2014-03-15 ENCOUNTER — Other Ambulatory Visit: Payer: Self-pay | Admitting: Internal Medicine

## 2014-05-09 ENCOUNTER — Other Ambulatory Visit: Payer: Self-pay | Admitting: Internal Medicine

## 2014-05-17 ENCOUNTER — Other Ambulatory Visit: Payer: Self-pay | Admitting: Emergency Medicine

## 2014-05-17 MED ORDER — FLUTICASONE PROPIONATE 50 MCG/ACT NA SUSP: 1.0000 | Freq: Every day | NASAL | Status: DC

## 2014-09-28 ENCOUNTER — Other Ambulatory Visit: Payer: Self-pay | Admitting: Obstetrics and Gynecology

## 2014-09-29 LAB — CYTOLOGY - PAP

## 2014-10-25 ENCOUNTER — Encounter (HOSPITAL_COMMUNITY): Payer: Self-pay | Admitting: *Deleted

## 2014-10-25 ENCOUNTER — Emergency Department (HOSPITAL_COMMUNITY)
Admission: EM | Admit: 2014-10-25 | Discharge: 2014-10-25 | Disposition: A | Discharge status: Home / Self Care | Payer: Medicare Other | Attending: Emergency Medicine | Admitting: Emergency Medicine

## 2014-10-25 ENCOUNTER — Emergency Department (HOSPITAL_COMMUNITY): Payer: Medicare Other

## 2014-10-25 DIAGNOSIS — S8991XA Unspecified injury of right lower leg, initial encounter: Secondary | ICD-10-CM | POA: Insufficient documentation

## 2014-10-25 DIAGNOSIS — F419 Anxiety disorder, unspecified: Secondary | ICD-10-CM | POA: Insufficient documentation

## 2014-10-25 DIAGNOSIS — Y998 Other external cause status: Secondary | ICD-10-CM | POA: Insufficient documentation

## 2014-10-25 DIAGNOSIS — Y9389 Activity, other specified: Secondary | ICD-10-CM | POA: Diagnosis not present

## 2014-10-25 DIAGNOSIS — E785 Hyperlipidemia, unspecified: Secondary | ICD-10-CM | POA: Diagnosis not present

## 2014-10-25 DIAGNOSIS — Z87891 Personal history of nicotine dependence: Secondary | ICD-10-CM | POA: Insufficient documentation

## 2014-10-25 DIAGNOSIS — G8929 Other chronic pain: Secondary | ICD-10-CM | POA: Insufficient documentation

## 2014-10-25 DIAGNOSIS — Y92008 Other place in unspecified non-institutional (private) residence as the place of occurrence of the external cause: Secondary | ICD-10-CM | POA: Diagnosis not present

## 2014-10-25 DIAGNOSIS — Z862 Personal history of diseases of the blood and blood-forming organs and certain disorders involving the immune mechanism: Secondary | ICD-10-CM | POA: Diagnosis not present

## 2014-10-25 DIAGNOSIS — Z7951 Long term (current) use of inhaled steroids: Secondary | ICD-10-CM | POA: Diagnosis not present

## 2014-10-25 DIAGNOSIS — F319 Bipolar disorder, unspecified: Secondary | ICD-10-CM | POA: Insufficient documentation

## 2014-10-25 DIAGNOSIS — G43909 Migraine, unspecified, not intractable, without status migrainosus: Secondary | ICD-10-CM | POA: Diagnosis not present

## 2014-10-25 DIAGNOSIS — Z79899 Other long term (current) drug therapy: Secondary | ICD-10-CM | POA: Diagnosis not present

## 2014-10-25 DIAGNOSIS — W1849XA Other slipping, tripping and stumbling without falling, initial encounter: Secondary | ICD-10-CM | POA: Insufficient documentation

## 2014-10-25 DIAGNOSIS — K219 Gastro-esophageal reflux disease without esophagitis: Secondary | ICD-10-CM | POA: Insufficient documentation

## 2014-10-25 DIAGNOSIS — Z91040 Latex allergy status: Secondary | ICD-10-CM | POA: Insufficient documentation

## 2014-10-25 DIAGNOSIS — M797 Fibromyalgia: Secondary | ICD-10-CM | POA: Insufficient documentation

## 2014-10-25 DIAGNOSIS — M25561 Pain in right knee: Secondary | ICD-10-CM

## 2014-10-25 NOTE — ED Notes (Signed)
Pt reports tripping on her granddaughter's toy yesterday.  Twisting her R knee.  Pt reports R knee pain and Bila feet numbness.  Pt reports she is able to walk without difficulty with her shoes on but if she takes them off her ankles becomes weak.

## 2014-10-25 NOTE — Discharge Instructions (Signed)
Please follow-up with your orthopedic surgeon for further evaluation and management, call him tomorrow morning first thing in schedule an appointment. If you're unable to make an appointment I have attached other orthopedic follow-up information. [facility] may be used if you cannot follow-up with those other providers. Please monitor for new or worsening signs or symptoms return immediately if any present. Please use Tylenol or ibuprofen as needed for pain.

## 2014-10-25 NOTE — ED Provider Notes (Signed)
CSN: [id number]     Arrival date & time 10/25/14  1718 History  This chart was scribed for Eyvonne Mechanic, PA-C, working with Benjiman Core, MD by Octavia Heir, ED Scribe. This patient was seen in room WTR6/WTR6 and the patient's care was started at 7:53 PM.    Chief Complaint  Patient presents with  . Leg Injury     The history is provided by the patient. No language interpreter was used.   HPI Comments: Connie Murray is a 44 y.o. female who presents to the Emergency Department complaining of a constant, gradualy improving right leg injury onset yesterday. She has associated numbness in her right foot. Pt reports tripping on her granddaughters toy yesterday and twisting her right knee. Pt notes having a prior break in her right foot which has 3 screws in it. Pt is unable to flex her right foot up and has difficulty ambulating. Patient reports that she has an orthopedic surgeon that she might see, but would like a brace and crutches until she can see him. She reports a history of chronic back pain for which she is on pain management.  Past Medical History  Diagnosis Date  . GERD (gastroesophageal reflux disease)   . Anxiety   . Bipolar disorder   . Chronic headaches   . Fibromyalgia   . Chronic lower back pain   . Normocytic anemia   . Cervical spondylosis   . Depression   . Migraine   . Radiculopathy     C8 or T1  . Degenerative arthritis   . Dyslipidemia   . Myalgia and myositis, unspecified 11/27/2012   Past Surgical History  Procedure Laterality Date  . Total abdominal hysterectomy w/ bilateral salpingoophorectomy  01/2010  . Nasal sinus surgery     Family History  Problem Relation Age of Onset  . Irritable bowel syndrome    . Ovarian cancer Mother   . Alcoholism Father   . Bone cancer Maternal Uncle    History  Substance Use Topics  . Smoking status: Former Smoker -- 1.00 packs/day for 28 years    Types: Cigarettes    Start date: 12/13/1982    Quit date:  07/15/2012  . Smokeless tobacco: Never Used     Comment: using vapor cigarette   . Alcohol Use: No   OB History    No data available     Review of Systems  All other systems reviewed and are negative.   Allergies  Bee venom; Molds & smuts; Benadryl; Diclofenac; Nsaids; Statins; Acetaminophen; Codeine; Darvocet; Latex; Red dye; and Tetracyclines & related  Home Medications   Prior to Admission medications   Medication Sig Start Date End Date Taking? Authorizing Provider  albuterol (PROVENTIL HFA;VENTOLIN HFA) 108 (90 BASE) MCG/ACT inhaler Inhale 2 puffs into the lungs every 6 (six) hours as needed for wheezing or shortness of breath. 03/09/13   Nishant Dhungel, MD  alprazolam Prudy Feeler) 2 MG tablet Take 1 tablet (2 mg total) by mouth at bedtime as needed for anxiety (3-4 times daily). 04/09/13   Quentin Angst, MD  B Complex-C (B-COMPLEX WITH VITAMIN C) tablet Take 2 tablets by mouth daily.     Historical Provider, MD  baclofen (LIORESAL) 10 MG tablet Take 1 tablet (10 mg total) by mouth 2 (two) times daily. 12/09/12   Ripudeep Jenna Luo, MD  budesonide-formoterol (SYMBICORT) 160-4.5 MCG/ACT inhaler Inhale 2 puffs into the lungs 2 (two) times daily. 03/09/13   Nishant Dhungel, MD  cetirizine (ZYRTEC)  10 MG tablet Take 10 mg by mouth daily.    Historical Provider, MD  EPINEPHrine (EPIPEN) 0.3 mg/0.3 mL SOAJ Inject 0.3 mLs (0.3 mg total) into the muscle once. 12/09/12   Ripudeep Jenna LuoK Rai, MD  estradiol (ESTRACE) 0.5 MG tablet Take 0.5 mg by mouth daily.  09/16/13   Historical Provider, MD  ezetimibe (ZETIA) 10 MG tablet Take 1 tablet (10 mg total) by mouth daily. 12/09/12   Ripudeep Jenna LuoK Rai, MD  Fenofibrate 150 MG CAPS Take 1 capsule by mouth daily.  08/30/13   Historical Provider, MD  FLUoxetine (PROZAC) 20 MG capsule Take 1 capsule (20 mg total) by mouth daily. One capsule daily for 3 weeks, then stop 03/09/13   Nishant Dhungel, MD  furosemide (LASIX) 40 MG tablet Take 1 tablet (40 mg total) by  mouth daily. 12/09/12   Ripudeep Jenna LuoK Rai, MD  Linaclotide (LINZESS) 290 MCG CAPS capsule Take 1 capsule (290 mcg total) by mouth every other day. 01/03/14   Meryl DareMalcolm T Stark, MD  metoprolol (LOPRESSOR) 50 MG tablet Take 1/2 tablet by mouth at lunch and 1 tablet at night 08/28/13   Historical Provider, MD  omeprazole (PRILOSEC) 20 MG capsule Take 1 capsule (20 mg total) by mouth 2 (two) times daily. 03/09/13   Nishant Dhungel, MD  oxycodone (OXY-IR) 5 MG capsule Take 5 mg by mouth every 6 (six) hours as needed for pain (Must last 28 days.). Pt is now getting this through pain management, [facility]   York Spanielharles K Willis, MD  potassium chloride SA (K-DUR,KLOR-CON) 20 MEQ tablet Take 20 mEq by mouth every 8 (eight) hours. bid    Historical Provider, MD  QUEtiapine (SEROQUEL XR) 300 MG 24 hr tablet Take 1 tablet (300 mg total) by mouth at bedtime. 03/09/13   Nishant Dhungel, MD  QVAR 80 MCG/ACT inhaler Inhale 1 puff into the lungs daily.  09/02/13   Historical Provider, MD  SUMAtriptan (IMITREX) 100 MG tablet Take 100 mg by mouth as needed.  08/13/13   Historical Provider, MD   Triage vitals: BP 131/73 mmHg  Pulse 99  Temp(Src) 98.1 F (36.7 C) (Oral)  Resp 18  SpO2 99% Physical Exam  Constitutional: She appears well-developed and well-nourished. No distress.  HENT:  Head: Normocephalic and atraumatic.  Eyes: Right eye exhibits no discharge. Left eye exhibits no discharge.  Pulmonary/Chest: Effort normal. No respiratory distress.  Musculoskeletal:  Diffuse tenderness to the right knee, no signs of swelling or edema, or soft tissue injury. Painful flexion and extension of the right knee, plantar flexion dorsiflexion decreased and right ankle. Right ankle pain free to palpation or movement. Sensation intact in the right distal extremity throughout with the exception of reduced sensation along the medial aspect of her right foot and dorsum of her left.  Neurological: She is alert.  Coordination normal.  Skin: No rash noted. She is not diaphoretic.  Psychiatric: She has a normal mood and affect. Her behavior is normal.  Nursing note and vitals reviewed.   ED Course  Procedures  DIAGNOSTIC STUDIES: Oxygen Saturation is 99% on RA, normal by my interpretation.  COORDINATION OF CARE:  7:57 PM Discussed treatment plan which includes x-ray of knee with pt at bedside and pt agreed to plan.  Labs Review Labs Reviewed - No data to display  Imaging Review Dg Knee Complete 4 Views Right  10/25/2014   CLINICAL DATA:  Fall yesterday in bathroom, twisting injury. Pain, bruising along medial aspect of  patella.  EXAM: RIGHT KNEE - COMPLETE 4+ VIEW  COMPARISON:  None.  FINDINGS: There is no evidence of fracture, dislocation, or joint effusion. There is no evidence of arthropathy or other focal bone abnormality. Soft tissues are unremarkable.  IMPRESSION: Negative.   Electronically Signed   By: Charlett Nose M.D.   On: 10/25/2014 21:02     EKG Interpretation None      MDM   Final diagnoses:  Knee pain, acute, right    Labs: none indicated   Imag DG knee complete 4 view right negative  Consults: none  Therapeutics: none  Assessment: Knee pain  Plan: Patient presents with right knee pain. Negative x-ray, some decreased sensation in her feet bilaterally non-diffuse chronic in nature. Patient was given a knee brace, crutches, instructions to use Tylenol and ibuprofen ice and elevation for the knee. She is instructed to follow-up with her orthopedic surgeon for it she reports she has close follow-up availability. She is instructed follow-up with [facility] in the event she cannot see the orthopedic surgeon and if symptoms continue to persist. Strict return precautions given the event new or worsening signs or symptoms present. She verbalizes her understanding and agreement today's plan and had no further questions at the time of discharge.    I personally  performed the services described in this documentation, which was scribed in my presence. The recorded information has been reviewed and is accurate.  Eyvonne Mechanic, PA-C 10/26/14 1724  Benjiman Core, MD 10/31/14 [phone]

## 2015-03-30 ENCOUNTER — Other Ambulatory Visit: Payer: Self-pay | Admitting: Internal Medicine

## 2015-04-06 NOTE — Telephone Encounter (Signed)
Nurse called patient, reached voicemail. Left message for patient to call Duward Allbritton with [facility], at [phone]. Nurse called patient to make patient aware of need for appointment. Nurse received refill request for flonase. [facility] 03/[facility]. Prescription can not be refilled prior to patient making appointment to be seen.

## 2015-04-25 ENCOUNTER — Encounter: Payer: Self-pay | Admitting: Gastroenterology

## 2015-04-30 ENCOUNTER — Other Ambulatory Visit: Payer: Self-pay | Admitting: Internal Medicine

## 2015-05-04 ENCOUNTER — Other Ambulatory Visit: Payer: Self-pay | Admitting: Internal Medicine

## 2016-01-09 ENCOUNTER — Other Ambulatory Visit: Payer: Self-pay | Admitting: Obstetrics & Gynecology

## 2016-01-09 DIAGNOSIS — N63 Unspecified lump in unspecified breast: Secondary | ICD-10-CM

## 2016-01-17 ENCOUNTER — Other Ambulatory Visit: Payer: Self-pay | Admitting: Obstetrics & Gynecology

## 2016-01-17 ENCOUNTER — Ambulatory Visit
Admission: RE | Admit: 2016-01-17 | Discharge: 2016-01-17 | Disposition: A | Discharge status: Home / Self Care | Payer: Medicare Other | Source: Ambulatory Visit | Attending: Obstetrics & Gynecology | Admitting: Obstetrics & Gynecology

## 2016-01-17 DIAGNOSIS — N6310 Unspecified lump in the right breast, unspecified quadrant: Secondary | ICD-10-CM

## 2016-01-17 DIAGNOSIS — N63 Unspecified lump in unspecified breast: Secondary | ICD-10-CM

## 2016-01-17 DIAGNOSIS — R2231 Localized swelling, mass and lump, right upper limb: Secondary | ICD-10-CM

## 2016-01-19 ENCOUNTER — Ambulatory Visit
Admission: RE | Admit: 2016-01-19 | Discharge: 2016-01-19 | Disposition: A | Discharge status: Home / Self Care | Payer: Medicare Other | Source: Ambulatory Visit | Attending: Obstetrics & Gynecology | Admitting: Obstetrics & Gynecology

## 2016-01-19 ENCOUNTER — Other Ambulatory Visit: Payer: Self-pay | Admitting: Obstetrics & Gynecology

## 2016-01-19 DIAGNOSIS — N6310 Unspecified lump in the right breast, unspecified quadrant: Secondary | ICD-10-CM

## 2016-01-19 DIAGNOSIS — R2231 Localized swelling, mass and lump, right upper limb: Secondary | ICD-10-CM

## 2016-01-19 HISTORY — PX: BREAST BIOPSY: SHX20

## 2016-07-04 ENCOUNTER — Other Ambulatory Visit: Payer: Self-pay | Admitting: Obstetrics & Gynecology

## 2016-07-04 DIAGNOSIS — N63 Unspecified lump in unspecified breast: Secondary | ICD-10-CM

## 2016-08-22 ENCOUNTER — Other Ambulatory Visit: Payer: Medicare Other

## 2016-08-27 ENCOUNTER — Other Ambulatory Visit: Payer: Medicare Other

## 2016-09-02 ENCOUNTER — Other Ambulatory Visit: Payer: Medicare Other

## 2016-09-04 ENCOUNTER — Ambulatory Visit
Admission: RE | Admit: 2016-09-04 | Discharge: 2016-09-04 | Disposition: A | Discharge status: Home / Self Care | Payer: Medicare Other | Source: Ambulatory Visit | Attending: Obstetrics & Gynecology | Admitting: Obstetrics & Gynecology

## 2016-09-04 ENCOUNTER — Other Ambulatory Visit: Payer: Self-pay | Admitting: Obstetrics & Gynecology

## 2016-09-04 DIAGNOSIS — N63 Unspecified lump in unspecified breast: Secondary | ICD-10-CM

## 2016-09-09 ENCOUNTER — Ambulatory Visit
Admission: RE | Admit: 2016-09-09 | Discharge: 2016-09-09 | Disposition: A | Discharge status: Home / Self Care | Payer: Medicare Other | Source: Ambulatory Visit | Attending: Obstetrics & Gynecology | Admitting: Obstetrics & Gynecology

## 2016-09-09 ENCOUNTER — Other Ambulatory Visit: Payer: Self-pay | Admitting: Obstetrics & Gynecology

## 2016-09-09 DIAGNOSIS — N63 Unspecified lump in unspecified breast: Secondary | ICD-10-CM

## 2016-09-09 DIAGNOSIS — R928 Other abnormal and inconclusive findings on diagnostic imaging of breast: Secondary | ICD-10-CM

## 2016-09-30 ENCOUNTER — Other Ambulatory Visit: Payer: Self-pay | Admitting: General Surgery

## 2016-09-30 DIAGNOSIS — N6311 Unspecified lump in the right breast, upper outer quadrant: Secondary | ICD-10-CM

## 2016-11-11 ENCOUNTER — Ambulatory Visit (INDEPENDENT_AMBULATORY_CARE_PROVIDER_SITE_OTHER): Payer: Medicare Other | Admitting: Gastroenterology

## 2016-11-11 ENCOUNTER — Encounter: Payer: Self-pay | Admitting: Gastroenterology

## 2016-11-11 VITALS — BP 114/68 | HR 100 | Ht 61.25 in | Wt 160.4 lb

## 2016-11-11 DIAGNOSIS — R0989 Other specified symptoms and signs involving the circulatory and respiratory systems: Secondary | ICD-10-CM

## 2016-11-11 DIAGNOSIS — F458 Other somatoform disorders: Secondary | ICD-10-CM | POA: Diagnosis not present

## 2016-11-11 DIAGNOSIS — K219 Gastro-esophageal reflux disease without esophagitis: Secondary | ICD-10-CM | POA: Diagnosis not present

## 2016-11-11 DIAGNOSIS — R1310 Dysphagia, unspecified: Secondary | ICD-10-CM

## 2016-11-11 DIAGNOSIS — K429 Umbilical hernia without obstruction or gangrene: Secondary | ICD-10-CM

## 2016-11-11 MED ORDER — OMEPRAZOLE 40 MG PO CPDR: 40.0000 mg | DELAYED_RELEASE_CAPSULE | Freq: Two times a day (BID) | ORAL | 11 refills | Status: DC

## 2016-11-11 NOTE — Progress Notes (Signed)
    History of Present Illness: This is a 46 year old female here for the evaluation of GERD, dysphagia, globus. She spent a good portion of her office visit discussing stress related to her mother passing away, brother passing away and financial struggles over the past year. She was tearful at times. She relates a lump in throat sensation for several months. She feels it is difficult to swallow both solids and liquids at times and at other times her swallowing is normal. She has occasional heartburn and regurgitation and the symptoms are helped by daily omeprazole. She previously underwent EGD in 2007 that showed esophageal erythema and gastric erythema. Gastric biopsies showed a reactive gastropathy. An empiric esophageal dilation was performed for complaints of dysphagia. She relates small bulge and pain at her umbilicus that worsens with exercise. Denies weight loss, constipation, diarrhea, change in stool caliber, melena, hematochezia, nausea, vomiting, chest pain.    Review of Systems: Pertinent positive and negative review of systems were noted in the above HPI section. All other review of systems were otherwise negative.  Current Medications, Allergies, Past Medical History, Past Surgical History, Family History and Social History were reviewed in [facility] electronic medical record.  Physical Exam: General: Well developed, well nourished, tearful, no acute distress Head: Normocephalic and atraumatic Eyes:  sclerae anicteric, EOMI Ears: Normal auditory acuity Mouth: No deformity or lesions Neck: Supple, no masses or thyromegaly Lungs: Clear throughout to auscultation Heart: Regular rate and rhythm; no murmurs, rubs or bruits Abdomen: Soft, non tender and non distended. No masses, hepatosplenomegaly or hernias noted. Normal Bowel sounds. Prior incision that umbilicus noted although I could not appreciate a hernia. Musculoskeletal: Symmetrical with no gross deformities  Skin: No  lesions on visible extremities Pulses:  Normal pulses noted Extremities: No clubbing, cyanosis, edema or deformities noted Neurological: Alert oriented x 4, grossly nonfocal Cervical Nodes:  No significant cervical adenopathy Inguinal Nodes: No significant inguinal adenopathy Psychological:  Alert and cooperative. Normal mood and affect  Assessment and Recommendations:  1. GERD, dysphagia, globus sensation. Closely follow all standard antireflux measures. Increase omeprazole to 40 mg twice daily. Schedule barium esophagram and EGD. The risks (including bleeding, perforation, infection, missed lesions, medication reactions and possible hospitalization or surgery if complications occur), benefits, and alternatives to endoscopy with possible biopsy and possible dilation were discussed with the patient and they consent to proceed.   2. Possible small umbilical hernia although I could not appreciate one on exam today. Surgical referral.  3. Grief and stress. Follow up with PCP.

## 2016-11-11 NOTE — Patient Instructions (Addendum)
We have sent the following medications to your pharmacy for you to pick up at your convenience: omeprazole 40 mg twice daily.   You have been scheduled for a Barium Esophogram at [facility] (1st floor of the hospital) on 12/03/16 at 10:30am. Please arrive 15 minutes prior to your appointment for registration. Make certain not to have anything to eat or drink 4 hours prior to your test. If you need to reschedule for any reason, please contact radiology at [phone] to do so. __________________________________________________________________ A barium swallow is an examination that concentrates on views of the esophagus. This tends to be a double contrast exam (barium and two liquids which, when combined, create a gas to distend the wall of the oesophagus) or single contrast (non-ionic iodine based). The study is usually tailored to your symptoms so a good history is essential. Attention is paid during the study to the form, structure and configuration of the esophagus, looking for functional disorders (such as aspiration, dysphagia, achalasia, motility and reflux) EXAMINATION You may be asked to change into a gown, depending on the type of swallow being performed. A radiologist and radiographer will perform the procedure. The radiologist will advise you of the type of contrast selected for your procedure and direct you during the exam. You will be asked to stand, sit or lie in several different positions and to hold a small amount of fluid in your mouth before being asked to swallow while the imaging is performed .In some instances you may be asked to swallow barium coated marshmallows to assess the motility of a solid food bolus. The exam can be recorded as a digital or video fluoroscopy procedure. POST PROCEDURE It will take 1-2 days for the barium to pass through your system. To facilitate this, it is important, unless otherwise directed, to increase your fluids for the next 24-48hrs and to  resume your normal diet.  This test typically takes about 30 minutes to perform. _________________________________________________________________________ Bonita QuinYou have been scheduled for an endoscopy. Please follow written instructions given to you at your visit today. If you use inhalers (even only as needed), please bring them with you on the day of your procedure. Your physician has requested that you go to www.startemmi.com and enter the access code given to you at your visit today. This web site gives a general overview about your procedure. However, you should still follow specific instructions given to you by our office regarding your preparation for the procedure.  Patient advised to avoid spicy, acidic, citrus, chocolate, mints, fruit and fruit juices.  Limit the intake of caffeine, alcohol and Soda.  Don't exercise too soon after eating.  Don't lie down within 3-4 hours of eating.  Elevate the head of your bed.  Follow up [facility] Abbey [facility]osenbower [facility] to discuss your hernia.   Thank you for choosing me and [facility].  Venita LickMalcolm T. Pleas KochStark, Jr., MD., Clementeen GrahamFACG

## 2016-12-03 ENCOUNTER — Telehealth: Payer: Self-pay | Admitting: Gastroenterology

## 2016-12-03 ENCOUNTER — Ambulatory Visit (HOSPITAL_COMMUNITY)
Admission: RE | Admit: 2016-12-03 | Discharge: 2016-12-03 | Disposition: A | Discharge status: Home / Self Care | Payer: Medicare Other | Source: Ambulatory Visit | Attending: Gastroenterology | Admitting: Gastroenterology

## 2016-12-03 DIAGNOSIS — R1310 Dysphagia, unspecified: Secondary | ICD-10-CM

## 2016-12-03 DIAGNOSIS — K222 Esophageal obstruction: Secondary | ICD-10-CM | POA: Diagnosis not present

## 2016-12-03 DIAGNOSIS — F458 Other somatoform disorders: Secondary | ICD-10-CM | POA: Diagnosis not present

## 2016-12-03 DIAGNOSIS — K219 Gastro-esophageal reflux disease without esophagitis: Secondary | ICD-10-CM | POA: Insufficient documentation

## 2016-12-03 DIAGNOSIS — R0989 Other specified symptoms and signs involving the circulatory and respiratory systems: Secondary | ICD-10-CM

## 2016-12-03 NOTE — Telephone Encounter (Signed)
I left a message for the patient that she needs to call [phone] and they can help her reschedule the appt at her convenience.

## 2016-12-14 ENCOUNTER — Other Ambulatory Visit: Payer: Self-pay | Admitting: General Surgery

## 2016-12-14 DIAGNOSIS — N6310 Unspecified lump in the right breast, unspecified quadrant: Secondary | ICD-10-CM

## 2016-12-20 ENCOUNTER — Other Ambulatory Visit: Payer: Self-pay | Admitting: General Surgery

## 2016-12-20 DIAGNOSIS — N6310 Unspecified lump in the right breast, unspecified quadrant: Secondary | ICD-10-CM

## 2016-12-23 ENCOUNTER — Encounter (HOSPITAL_BASED_OUTPATIENT_CLINIC_OR_DEPARTMENT_OTHER): Payer: Self-pay | Admitting: *Deleted

## 2016-12-24 ENCOUNTER — Other Ambulatory Visit: Payer: Self-pay | Admitting: General Surgery

## 2016-12-24 DIAGNOSIS — R928 Other abnormal and inconclusive findings on diagnostic imaging of breast: Secondary | ICD-10-CM

## 2016-12-27 ENCOUNTER — Encounter (HOSPITAL_BASED_OUTPATIENT_CLINIC_OR_DEPARTMENT_OTHER)
Admission: RE | Admit: 2016-12-27 | Discharge: 2016-12-27 | Disposition: A | Discharge status: Home / Self Care | Payer: Medicare Other | Source: Ambulatory Visit | Attending: General Surgery | Admitting: General Surgery

## 2016-12-27 ENCOUNTER — Ambulatory Visit
Admission: RE | Admit: 2016-12-27 | Discharge: 2016-12-27 | Disposition: A | Discharge status: Home / Self Care | Payer: Medicare Other | Source: Ambulatory Visit | Attending: General Surgery | Admitting: General Surgery

## 2016-12-27 DIAGNOSIS — G8921 Chronic pain due to trauma: Secondary | ICD-10-CM | POA: Diagnosis not present

## 2016-12-27 DIAGNOSIS — K219 Gastro-esophageal reflux disease without esophagitis: Secondary | ICD-10-CM | POA: Diagnosis not present

## 2016-12-27 DIAGNOSIS — Z91030 Bee allergy status: Secondary | ICD-10-CM | POA: Diagnosis not present

## 2016-12-27 DIAGNOSIS — Z79899 Other long term (current) drug therapy: Secondary | ICD-10-CM | POA: Diagnosis not present

## 2016-12-27 DIAGNOSIS — Z885 Allergy status to narcotic agent status: Secondary | ICD-10-CM | POA: Diagnosis not present

## 2016-12-27 DIAGNOSIS — J449 Chronic obstructive pulmonary disease, unspecified: Secondary | ICD-10-CM | POA: Diagnosis not present

## 2016-12-27 DIAGNOSIS — Z91040 Latex allergy status: Secondary | ICD-10-CM | POA: Diagnosis not present

## 2016-12-27 DIAGNOSIS — F319 Bipolar disorder, unspecified: Secondary | ICD-10-CM | POA: Diagnosis not present

## 2016-12-27 DIAGNOSIS — Z87891 Personal history of nicotine dependence: Secondary | ICD-10-CM | POA: Diagnosis not present

## 2016-12-27 DIAGNOSIS — N6310 Unspecified lump in the right breast, unspecified quadrant: Secondary | ICD-10-CM

## 2016-12-27 DIAGNOSIS — Z90722 Acquired absence of ovaries, bilateral: Secondary | ICD-10-CM | POA: Diagnosis not present

## 2016-12-27 DIAGNOSIS — M1990 Unspecified osteoarthritis, unspecified site: Secondary | ICD-10-CM | POA: Diagnosis not present

## 2016-12-27 DIAGNOSIS — Z888 Allergy status to other drugs, medicaments and biological substances status: Secondary | ICD-10-CM | POA: Diagnosis not present

## 2016-12-27 DIAGNOSIS — Z01812 Encounter for preprocedural laboratory examination: Secondary | ICD-10-CM | POA: Diagnosis not present

## 2016-12-27 DIAGNOSIS — F1190 Opioid use, unspecified, uncomplicated: Secondary | ICD-10-CM | POA: Diagnosis not present

## 2016-12-27 DIAGNOSIS — Z886 Allergy status to analgesic agent status: Secondary | ICD-10-CM | POA: Diagnosis not present

## 2016-12-27 DIAGNOSIS — Z803 Family history of malignant neoplasm of breast: Secondary | ICD-10-CM | POA: Diagnosis not present

## 2016-12-27 DIAGNOSIS — Z90710 Acquired absence of both cervix and uterus: Secondary | ICD-10-CM | POA: Diagnosis not present

## 2016-12-27 DIAGNOSIS — N6311 Unspecified lump in the right breast, upper outer quadrant: Secondary | ICD-10-CM | POA: Diagnosis present

## 2016-12-27 DIAGNOSIS — Z9079 Acquired absence of other genital organ(s): Secondary | ICD-10-CM | POA: Diagnosis not present

## 2016-12-27 DIAGNOSIS — N6081 Other benign mammary dysplasias of right breast: Secondary | ICD-10-CM | POA: Diagnosis not present

## 2016-12-27 LAB — CBC WITH DIFFERENTIAL/PLATELET
BASOS ABS: 0 10*3/uL (ref 0.0–0.1)
Basophils Relative: 1 %
EOS ABS: 0.1 10*3/uL (ref 0.0–0.7)
EOS PCT: 2 %
HCT: 34.7 % — ABNORMAL LOW (ref 36.0–46.0)
HEMOGLOBIN: 11.1 g/dL — AB (ref 12.0–15.0)
LYMPHS PCT: 43 %
Lymphs Abs: 2.5 10*3/uL (ref 0.7–4.0)
MCH: 27.7 pg (ref 26.0–34.0)
MCHC: 32 g/dL (ref 30.0–36.0)
MCV: 86.5 fL (ref 78.0–100.0)
Monocytes Absolute: 0.6 10*3/uL (ref 0.1–1.0)
Monocytes Relative: 10 %
NEUTROS PCT: 44 %
Neutro Abs: 2.6 10*3/uL (ref 1.7–7.7)
PLATELETS: 405 10*3/uL — AB (ref 150–400)
RBC: 4.01 MIL/uL (ref 3.87–5.11)
RDW: 16 % — AB (ref 11.5–15.5)
WBC: 5.8 10*3/uL (ref 4.0–10.5)

## 2016-12-27 LAB — COMPREHENSIVE METABOLIC PANEL
ALBUMIN: 3.5 g/dL (ref 3.5–5.0)
ALT: 21 U/L (ref 14–54)
ANION GAP: 8 (ref 5–15)
AST: 30 U/L (ref 15–41)
Alkaline Phosphatase: 40 U/L (ref 38–126)
BUN: 22 mg/dL — ABNORMAL HIGH (ref 6–20)
CHLORIDE: 103 mmol/L (ref 101–111)
CO2: 27 mmol/L (ref 22–32)
Calcium: 9.3 mg/dL (ref 8.9–10.3)
Creatinine, Ser: 1.38 mg/dL — ABNORMAL HIGH (ref 0.44–1.00)
GFR calc Af Amer: 51 mL/min — ABNORMAL LOW (ref >60)
GFR calc non Af Amer: 44 mL/min — ABNORMAL LOW (ref >60)
GLUCOSE: 90 mg/dL (ref 65–99)
POTASSIUM: 4.5 mmol/L (ref 3.5–5.1)
SODIUM: 138 mmol/L (ref 135–145)
Total Bilirubin: 0.5 mg/dL (ref 0.3–1.2)
Total Protein: 7.3 g/dL (ref 6.5–8.1)
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
## 2016-12-30 ENCOUNTER — Ambulatory Visit (HOSPITAL_BASED_OUTPATIENT_CLINIC_OR_DEPARTMENT_OTHER)
Admission: RE | Admit: 2016-12-30 | Discharge: 2016-12-30 | Disposition: A | Discharge status: Home / Self Care | Payer: Medicare Other | Source: Ambulatory Visit | Attending: General Surgery | Admitting: General Surgery

## 2016-12-30 ENCOUNTER — Ambulatory Visit (HOSPITAL_BASED_OUTPATIENT_CLINIC_OR_DEPARTMENT_OTHER): Payer: Medicare Other | Admitting: Certified Registered"

## 2016-12-30 ENCOUNTER — Ambulatory Visit
Admission: RE | Admit: 2016-12-30 | Discharge: 2016-12-30 | Disposition: A | Discharge status: Home / Self Care | Payer: Medicare Other | Source: Ambulatory Visit | Attending: General Surgery | Admitting: General Surgery

## 2016-12-30 ENCOUNTER — Encounter (HOSPITAL_BASED_OUTPATIENT_CLINIC_OR_DEPARTMENT_OTHER)
Admission: RE | Disposition: A | Discharge status: Home / Self Care | Payer: Self-pay | Source: Ambulatory Visit | Attending: General Surgery

## 2016-12-30 ENCOUNTER — Encounter (HOSPITAL_BASED_OUTPATIENT_CLINIC_OR_DEPARTMENT_OTHER): Payer: Self-pay | Admitting: Certified Registered"

## 2016-12-30 DIAGNOSIS — M1990 Unspecified osteoarthritis, unspecified site: Secondary | ICD-10-CM | POA: Insufficient documentation

## 2016-12-30 DIAGNOSIS — J449 Chronic obstructive pulmonary disease, unspecified: Secondary | ICD-10-CM | POA: Diagnosis not present

## 2016-12-30 DIAGNOSIS — Z9079 Acquired absence of other genital organ(s): Secondary | ICD-10-CM | POA: Insufficient documentation

## 2016-12-30 DIAGNOSIS — Z885 Allergy status to narcotic agent status: Secondary | ICD-10-CM | POA: Insufficient documentation

## 2016-12-30 DIAGNOSIS — K219 Gastro-esophageal reflux disease without esophagitis: Secondary | ICD-10-CM | POA: Insufficient documentation

## 2016-12-30 DIAGNOSIS — Z888 Allergy status to other drugs, medicaments and biological substances status: Secondary | ICD-10-CM | POA: Insufficient documentation

## 2016-12-30 DIAGNOSIS — Z79899 Other long term (current) drug therapy: Secondary | ICD-10-CM | POA: Insufficient documentation

## 2016-12-30 DIAGNOSIS — Z91030 Bee allergy status: Secondary | ICD-10-CM | POA: Insufficient documentation

## 2016-12-30 DIAGNOSIS — N6081 Other benign mammary dysplasias of right breast: Secondary | ICD-10-CM | POA: Diagnosis not present

## 2016-12-30 DIAGNOSIS — Z01812 Encounter for preprocedural laboratory examination: Secondary | ICD-10-CM | POA: Diagnosis not present

## 2016-12-30 DIAGNOSIS — Z886 Allergy status to analgesic agent status: Secondary | ICD-10-CM | POA: Insufficient documentation

## 2016-12-30 DIAGNOSIS — F1190 Opioid use, unspecified, uncomplicated: Secondary | ICD-10-CM | POA: Insufficient documentation

## 2016-12-30 DIAGNOSIS — Z90710 Acquired absence of both cervix and uterus: Secondary | ICD-10-CM | POA: Insufficient documentation

## 2016-12-30 DIAGNOSIS — Z87891 Personal history of nicotine dependence: Secondary | ICD-10-CM | POA: Insufficient documentation

## 2016-12-30 DIAGNOSIS — Z803 Family history of malignant neoplasm of breast: Secondary | ICD-10-CM | POA: Insufficient documentation

## 2016-12-30 DIAGNOSIS — G8921 Chronic pain due to trauma: Secondary | ICD-10-CM | POA: Insufficient documentation

## 2016-12-30 DIAGNOSIS — F319 Bipolar disorder, unspecified: Secondary | ICD-10-CM | POA: Diagnosis not present

## 2016-12-30 DIAGNOSIS — N6310 Unspecified lump in the right breast, unspecified quadrant: Secondary | ICD-10-CM

## 2016-12-30 DIAGNOSIS — Z90722 Acquired absence of ovaries, bilateral: Secondary | ICD-10-CM | POA: Insufficient documentation

## 2016-12-30 DIAGNOSIS — Z91040 Latex allergy status: Secondary | ICD-10-CM | POA: Insufficient documentation

## 2016-12-30 HISTORY — DX: Unspecified lump in the right breast, unspecified quadrant: N63.10

## 2016-12-30 HISTORY — PX: BREAST LUMPECTOMY WITH RADIOACTIVE SEED LOCALIZATION: SHX6424

## 2016-12-30 SURGERY — BREAST LUMPECTOMY WITH RADIOACTIVE SEED LOCALIZATION
Anesthesia: General | Site: Breast | Laterality: Right

## 2016-12-30 MED ORDER — 0.9 % SODIUM CHLORIDE (POUR BTL) OPTIME
TOPICAL | Status: DC | PRN
  Administered 2016-12-30: 1000 mL

## 2016-12-30 MED ORDER — FENTANYL CITRATE (PF) 100 MCG/2ML IJ SOLN
INTRAMUSCULAR | Status: AC
  Filled 2016-12-30: qty 2

## 2016-12-30 MED ORDER — CHLORHEXIDINE GLUCONATE CLOTH 2 % EX PADS: 6.0000 | MEDICATED_PAD | Freq: Once | CUTANEOUS | Status: DC

## 2016-12-30 MED ORDER — FENTANYL CITRATE (PF) 100 MCG/2ML IJ SOLN
50.0000 ug | INTRAMUSCULAR | Status: DC | PRN
  Administered 2016-12-30 (×2): 50 ug via INTRAVENOUS

## 2016-12-30 MED ORDER — BUPIVACAINE-EPINEPHRINE (PF) 0.5% -1:200000 IJ SOLN
INTRAMUSCULAR | Status: DC | PRN
  Administered 2016-12-30: 10 mL

## 2016-12-30 MED ORDER — ONDANSETRON HCL 4 MG/2ML IJ SOLN: 4.0000 mg | Freq: Four times a day (QID) | INTRAMUSCULAR | Status: DC | PRN

## 2016-12-30 MED ORDER — MIDAZOLAM HCL 2 MG/2ML IJ SOLN
INTRAMUSCULAR | Status: AC
  Filled 2016-12-30: qty 2

## 2016-12-30 MED ORDER — GABAPENTIN 300 MG PO CAPS: 300.0000 mg | ORAL_CAPSULE | ORAL | Status: DC

## 2016-12-30 MED ORDER — DEXAMETHASONE SODIUM PHOSPHATE 10 MG/ML IJ SOLN
INTRAMUSCULAR | Status: AC
Start: 2016-12-30 — End: 2016-12-30
  Filled 2016-12-30: qty 1

## 2016-12-30 MED ORDER — ONDANSETRON HCL 4 MG/2ML IJ SOLN
INTRAMUSCULAR | Status: AC
  Filled 2016-12-30: qty 2

## 2016-12-30 MED ORDER — BUPIVACAINE-EPINEPHRINE (PF) 0.5% -1:200000 IJ SOLN
INTRAMUSCULAR | Status: AC
  Filled 2016-12-30: qty 30

## 2016-12-30 MED ORDER — CEFAZOLIN SODIUM-DEXTROSE 2-4 GM/100ML-% IV SOLN
INTRAVENOUS | Status: AC
  Filled 2016-12-30: qty 100

## 2016-12-30 MED ORDER — LIDOCAINE 2% (20 MG/ML) 5 ML SYRINGE
INTRAMUSCULAR | Status: AC
  Filled 2016-12-30: qty 5

## 2016-12-30 MED ORDER — LACTATED RINGERS IV SOLN
INTRAVENOUS | Status: DC
  Administered 2016-12-30 (×2): via INTRAVENOUS

## 2016-12-30 MED ORDER — CELECOXIB 400 MG PO CAPS: 400.0000 mg | ORAL_CAPSULE | ORAL | Status: DC

## 2016-12-30 MED ORDER — ACETAMINOPHEN 500 MG PO TABS: 1000.0000 mg | ORAL_TABLET | ORAL | Status: DC

## 2016-12-30 MED ORDER — MIDAZOLAM HCL 2 MG/2ML IJ SOLN
1.0000 mg | INTRAMUSCULAR | Status: DC | PRN
  Administered 2016-12-30: 2 mg via INTRAVENOUS

## 2016-12-30 MED ORDER — DEXAMETHASONE SODIUM PHOSPHATE 4 MG/ML IJ SOLN
INTRAMUSCULAR | Status: DC | PRN
  Administered 2016-12-30: 10 mg via INTRAVENOUS

## 2016-12-30 MED ORDER — PROPOFOL 500 MG/50ML IV EMUL
INTRAVENOUS | Status: AC
  Filled 2016-12-30: qty 50

## 2016-12-30 MED ORDER — FENTANYL CITRATE (PF) 100 MCG/2ML IJ SOLN: 25.0000 ug | INTRAMUSCULAR | Status: DC | PRN

## 2016-12-30 MED ORDER — OXYCODONE HCL 5 MG/5ML PO SOLN: 5.0000 mg | Freq: Once | ORAL | Status: DC | PRN

## 2016-12-30 MED ORDER — ONDANSETRON HCL 4 MG/2ML IJ SOLN
INTRAMUSCULAR | Status: DC | PRN
  Administered 2016-12-30: 4 mg via INTRAVENOUS

## 2016-12-30 MED ORDER — CEFAZOLIN SODIUM-DEXTROSE 2-4 GM/100ML-% IV SOLN: 2.0000 g | INTRAVENOUS | Status: DC

## 2016-12-30 MED ORDER — ACETAMINOPHEN 500 MG PO TABS
ORAL_TABLET | ORAL | Status: AC
  Filled 2016-12-30: qty 2

## 2016-12-30 MED ORDER — PROPOFOL 10 MG/ML IV BOLUS
INTRAVENOUS | Status: DC | PRN
  Administered 2016-12-30: 200 mg via INTRAVENOUS

## 2016-12-30 MED ORDER — LIDOCAINE HCL (CARDIAC) 20 MG/ML IV SOLN
INTRAVENOUS | Status: DC | PRN
  Administered 2016-12-30: 60 mg via INTRAVENOUS

## 2016-12-30 MED ORDER — ACETAMINOPHEN 500 MG PO TABS
1000.0000 mg | ORAL_TABLET | ORAL | Status: AC
  Administered 2016-12-30: 1000 mg via ORAL

## 2016-12-30 MED ORDER — OXYCODONE HCL 5 MG PO TABS: 5.0000 mg | ORAL_TABLET | Freq: Once | ORAL | Status: DC | PRN

## 2016-12-30 MED ORDER — CELECOXIB 200 MG PO CAPS
ORAL_CAPSULE | ORAL | Status: AC
  Filled 2016-12-30: qty 2

## 2016-12-30 MED ORDER — CELECOXIB 400 MG PO CAPS
400.0000 mg | ORAL_CAPSULE | ORAL | Status: AC
  Administered 2016-12-30: 400 mg via ORAL

## 2016-12-30 MED ORDER — SCOPOLAMINE 1 MG/3DAYS TD PT72: 1.0000 | MEDICATED_PATCH | Freq: Once | TRANSDERMAL | Status: DC | PRN

## 2016-12-30 MED ORDER — CEFAZOLIN SODIUM-DEXTROSE 2-4 GM/100ML-% IV SOLN
2.0000 g | INTRAVENOUS | Status: AC
  Administered 2016-12-30: 2 g via INTRAVENOUS

## 2016-12-30 SURGICAL SUPPLY — 48 items
ADH SKN CLS APL DERMABOND .7 (GAUZE/BANDAGES/DRESSINGS) ×1
APPLIER CLIP 9.375 MED OPEN (MISCELLANEOUS) ×3
APR CLP MED 9.3 20 MLT OPN (MISCELLANEOUS) ×1
BINDER BREAST LRG (GAUZE/BANDAGES/DRESSINGS) ×2 IMPLANT
BLADE HEX COATED 2.75 (ELECTRODE) ×3 IMPLANT
BLADE SURG 15 STRL LF DISP TIS (BLADE) ×1 IMPLANT
BLADE SURG 15 STRL SS (BLADE) ×3
CANISTER SUCT 1200ML W/VALVE (MISCELLANEOUS) ×3 IMPLANT
CHLORAPREP W/TINT 26ML (MISCELLANEOUS) ×3 IMPLANT
CLIP APPLIE 9.375 MED OPEN (MISCELLANEOUS) IMPLANT
COVER BACK TABLE 60X90IN (DRAPES) ×3 IMPLANT
COVER MAYO STAND STRL (DRAPES) ×3 IMPLANT
COVER PROBE W GEL 5X96 (DRAPES) ×3 IMPLANT
DERMABOND ADVANCED (GAUZE/BANDAGES/DRESSINGS) ×2
DERMABOND ADVANCED .7 DNX12 (GAUZE/BANDAGES/DRESSINGS) ×1 IMPLANT
DEVICE DUBIN W/COMP PLATE 8390 (MISCELLANEOUS) ×3 IMPLANT
DRAPE LAPAROSCOPIC ABDOMINAL (DRAPES) ×3 IMPLANT
DRAPE UTILITY XL STRL (DRAPES) ×3 IMPLANT
DRSG PAD ABDOMINAL 8X10 ST (GAUZE/BANDAGES/DRESSINGS) ×2 IMPLANT
ELECT REM PT RETURN 9FT ADLT (ELECTROSURGICAL) ×3
ELECTRODE REM PT RTRN 9FT ADLT (ELECTROSURGICAL) ×1 IMPLANT
GAUZE SPONGE 4X4 12PLY STRL (GAUZE/BANDAGES/DRESSINGS) ×2 IMPLANT
GAUZE SPONGE 4X4 12PLY STRL LF (GAUZE/BANDAGES/DRESSINGS) ×2 IMPLANT
GOWN STRL REUS W/ TWL LRG LVL3 (GOWN DISPOSABLE) ×1 IMPLANT
GOWN STRL REUS W/ TWL XL LVL3 (GOWN DISPOSABLE) ×1 IMPLANT
GOWN STRL REUS W/TWL LRG LVL3 (GOWN DISPOSABLE) ×3
GOWN STRL REUS W/TWL XL LVL3 (GOWN DISPOSABLE) ×3
KIT MARKER MARGIN INK (KITS) ×3 IMPLANT
NDL HYPO 25X1 1.5 SAFETY (NEEDLE) ×1 IMPLANT
NEEDLE HYPO 25X1 1.5 SAFETY (NEEDLE) ×3 IMPLANT
NS IRRIG 1000ML POUR BTL (IV SOLUTION) ×3 IMPLANT
PACK BASIN DAY SURGERY FS (CUSTOM PROCEDURE TRAY) ×3 IMPLANT
PENCIL BUTTON HOLSTER BLD 10FT (ELECTRODE) ×3 IMPLANT
SLEEVE SCD COMPRESS KNEE MED (MISCELLANEOUS) ×3 IMPLANT
SPONGE LAP 18X18 X RAY DECT (DISPOSABLE) IMPLANT
SPONGE LAP 4X18 X RAY DECT (DISPOSABLE) ×3 IMPLANT
SUT MNCRL AB 4-0 PS2 18 (SUTURE) ×3 IMPLANT
SUT SILK 2 0 SH (SUTURE) ×3 IMPLANT
SUT VIC AB 2-0 CT1 27 (SUTURE)
SUT VIC AB 2-0 CT1 TAPERPNT 27 (SUTURE) IMPLANT
SUT VIC AB 3-0 SH 27 (SUTURE)
SUT VIC AB 3-0 SH 27X BRD (SUTURE) IMPLANT
SUT VICRYL 3-0 CR8 SH (SUTURE) ×3 IMPLANT
SYR 10ML LL (SYRINGE) ×3 IMPLANT
TOWEL OR 17X24 6PK STRL BLUE (TOWEL DISPOSABLE) ×3 IMPLANT
TUBE CONNECTING 20'X1/4 (TUBING) ×1
TUBE CONNECTING 20X1/4 (TUBING) ×2 IMPLANT
YANKAUER SUCT BULB TIP NO VENT (SUCTIONS) ×3 IMPLANT
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
## 2016-12-30 NOTE — Anesthesia Procedure Notes (Signed)
Procedure Name: LMA Insertion Date/Time: 12/30/2016 9:54 AM Performed by: Korin Hartwell D Pre-anesthesia Checklist: Patient identified, Emergency Drugs available, Suction available and Patient being monitored Patient Re-evaluated:Patient Re-evaluated prior to induction Oxygen Delivery Method: Circle system utilized Preoxygenation: Pre-oxygenation with 100% oxygen Induction Type: IV induction Ventilation: Mask ventilation without difficulty LMA: LMA inserted LMA Size: 3.0 Number of attempts: 1 Airway Equipment and Method: Bite block Placement Confirmation: positive ETCO2 Tube secured with: Tape Dental Injury: Teeth and Oropharynx as per pre-operative assessment

## 2016-12-30 NOTE — Discharge Instructions (Signed)
[facility] Office Phone Number [phone]  BREAST BIOPSY/ PARTIAL MASTECTOMY: POST OP INSTRUCTIONS  Always review your discharge instruction sheet given to you by the facility where your surgery was performed.  IF YOU HAVE DISABILITY OR FAMILY LEAVE FORMS, YOU MUST BRING THEM TO THE OFFICE FOR PROCESSING.  DO NOT GIVE THEM TO YOUR DOCTOR.  1. A prescription for pain medication may be given to you upon discharge.  Take your pain medication as prescribed, if needed.  If narcotic pain medicine is not needed, then you may take acetaminophen (Tylenol) or ibuprofen (Advil) as needed. 2. Take your usually prescribed medications unless otherwise directed 3. If you need a refill on your pain medication, please contact your pharmacy.  They will contact our office to request authorization.  Prescriptions will not be filled after 5pm or on week-ends. 4. You should eat very light the first 24 hours after surgery, such as soup, crackers, pudding, etc.  Resume your normal diet the day after surgery. 5. Most patients will experience some swelling and bruising in the breast.  Ice packs and a good support bra will help.  Swelling and bruising can take several days to resolve.  6. It is common to experience some constipation if taking pain medication after surgery.  Increasing fluid intake and taking a stool softener will usually help or prevent this problem from occurring.  A mild laxative (Milk of Magnesia or Miralax) should be taken according to package directions if there are no bowel movements after 48 hours. 7. Unless discharge instructions indicate otherwise, you may remove your bandages 24-48 hours after surgery, and you may shower at that time.  You may have steri-strips (small skin tapes) in place directly over the incision.  These strips should be left on the skin for 7-10 days.  If your surgeon used skin glue on the incision, you may shower in 24 hours.  The glue will flake off over the  next 2-3 weeks.  Any sutures or staples will be removed at the office during your follow-up visit. 8. ACTIVITIES:  You may resume regular daily activities (gradually increasing) beginning the next day.  Wearing a good support bra or sports bra minimizes pain and swelling.  You may have sexual intercourse when it is comfortable. a. You may drive when you no longer are taking prescription pain medication, you can comfortably wear a seatbelt, and you can safely maneuver your car and apply brakes. b. RETURN TO WORK:  ______________________________________________________________________________________ 9. You should see your doctor in the office for a follow-up appointment approximately two weeks after your surgery.  Your doctors nurse will typically make your follow-up appointment when she calls you with your pathology report.  Expect your pathology report 2-3 business days after your surgery.  You may call to check if you do not hear from us after three days. 10. OTHER INSTRUCTIONS: _______________________________________________________________________________________________ _____________________________________________________________________________________________________________________________________ _____________________________________________________________________________________________________________________________________ _________________________________  Post Anesthesia Home Care Instructions  Activity: Get plenty of rest for the remainder of the day. A responsible individual must stay with you for 24 hours following the procedure.  For the next 24 hours, DO NOT: -Drive a car -Advertising copywriterperate machinery -Drink alcoholic beverages -Take any medication unless instructed by your physician -Make any legal decisions or sign important papers.  Meals: Start with liquid foods such as gelatin or soup. Progress to regular foods as tolerated. Avoid greasy, spicy, heavy foods. If nausea and/or  vomiting occur, drink only clear liquids until the nausea and/or vomiting subsides. Call your physician  if vomiting continues.  Special Instructions/Symptoms: Your throat may feel dry or sore from the anesthesia or the breathing tube placed in your throat during surgery. If this causes discomfort, gargle with warm salt water. The discomfort should disappear within 24 hours.  If you had a scopolamine patch placed behind your ear for the management of post- operative nausea and/or vomiting:  1. The medication in the patch is effective for 72 hours, after which it should be removed.  Wrap patch in a tissue and discard in the trash. Wash hands thoroughly with soap and water. 2. You may remove the patch earlier than 72 hours if you experience unpleasant side effects which may include dry mouth, dizziness or visual disturbances. 3. Avoid touching the patch. Wash your hands with soap and water after contact with the patch.   ____________________________________________________________________________________________________  WHEN TO CALL YOUR DOCTOR: 1. Fever over 101.0 2. Nausea and/or vomiting. 3. Extreme swelling or bruising. 4. Continued bleeding from incision. 5. Increased pain, redness, or drainage from the incision.  The clinic staff is available to answer your questions during regular business hours.  Please dont hesitate to call and ask to speak to one of the nurses for clinical concerns.  If you have a medical emergency, go to the nearest emergency room or call 911.  A surgeon from [facility] is always on call at the hospital.  For further questions, please visit [email or web address]

## 2016-12-30 NOTE — Anesthesia Preprocedure Evaluation (Addendum)
Anesthesia Evaluation  Patient identified by MRN, date of birth, ID band Patient awake    Reviewed: Allergy & Precautions, H&P , NPO status , Patient's Chart, lab work & pertinent test results  Airway Mallampati: II   Neck ROM: full    Dental   Pulmonary asthma , former smoker,    breath sounds clear to auscultation       Cardiovascular negative cardio ROS   Rhythm:regular Rate:Normal     Neuro/Psych  Headaches, PSYCHIATRIC DISORDERS Anxiety Depression Bipolar Disorder    GI/Hepatic GERD  Medicated,  Endo/Other    Renal/GU      Musculoskeletal  (+) Arthritis , Fibromyalgia -  Abdominal   Peds  Hematology  (+) anemia ,   Anesthesia Other Findings   Reproductive/Obstetrics                            Anesthesia Physical Anesthesia Plan  ASA: II  Anesthesia Plan: General   Post-op Pain Management:    Induction: Intravenous  PONV Risk Score and Plan:   Airway Management Planned: LMA  Additional Equipment:   Intra-op Plan:   Post-operative Plan:   Informed Consent: I have reviewed the patients History and Physical, chart, labs and discussed the procedure including the risks, benefits and alternatives for the proposed anesthesia with the patient or authorized representative who has indicated his/her understanding and acceptance.     Plan Discussed with: CRNA, Anesthesiologist and Surgeon  Anesthesia Plan Comments:         Anesthesia Quick Evaluation

## 2016-12-30 NOTE — Transfer of Care (Signed)
Immediate Anesthesia Transfer of Care Note  Patient: Connie Murray  Procedure(s) Performed: Procedure(s) with comments: RIGHT BREAST LUMPECTOMY WITH RADIOACTIVE SEED LOCALIZATION (Right) - ERAS PATHWAY  Patient Location: PACU  Anesthesia Type:General  Level of Consciousness: awake, alert , oriented and patient cooperative  Airway & Oxygen Therapy: Patient Spontanous Breathing and Patient connected to face mask oxygen  Post-op Assessment: Report given to RN and Post -op Vital signs reviewed and stable  Post vital signs: Reviewed and stable  Last Vitals:  Vitals:   12/30/16 0801  BP: (!) 143/72  Pulse: 87  Resp: 18  Temp: 36.8 C  SpO2: 98%    Last Pain:  Vitals:   12/30/16 0801  TempSrc: Oral         Complications: No apparent anesthesia complications

## 2016-12-30 NOTE — Anesthesia Postprocedure Evaluation (Signed)
Anesthesia Post Note  Patient: Connie Murray  Procedure(s) Performed: Procedure(s) (LRB): RIGHT BREAST LUMPECTOMY WITH RADIOACTIVE SEED LOCALIZATION (Right)     Patient location during evaluation: PACU Anesthesia Type: General Level of consciousness: awake and alert and patient cooperative Pain management: pain level controlled Vital Signs Assessment: post-procedure vital signs reviewed and stable Respiratory status: spontaneous breathing and respiratory function stable Cardiovascular status: stable Anesthetic complications: no    Last Vitals:  Vitals:   12/30/16 1115 12/30/16 1142  BP: 127/76 131/74  Pulse: 80 78  Resp: 14 18  Temp:  (!) 36.4 C  SpO2: 98% 97%    Last Pain:  Vitals:   12/30/16 1142  TempSrc: Oral  PainSc: 0-No pain                 Gerrod Maule S

## 2016-12-30 NOTE — Interval H&P Note (Signed)
History and Physical Interval Note:  12/30/2016 9:38 AM  Connie Murray  has presented today for surgery, with the diagnosis of right breast mass  The various methods of treatment have been discussed with the patient and family. After consideration of risks, benefits and other options for treatment, the patient has consented to  Procedure(s) with comments: RIGHT BREAST LUMPECTOMY WITH RADIOACTIVE SEED LOCALIZATION (Right) - ERAS PATHWAY as a surgical intervention .  The patient's history has been reviewed, patient examined, no change in status, stable for surgery.  I have reviewed the patient's chart and labs.  Questions were answered to the patient's satisfaction.     Ernestene MentionINGRAM,Derita Michelsen M

## 2016-12-31 ENCOUNTER — Encounter (HOSPITAL_BASED_OUTPATIENT_CLINIC_OR_DEPARTMENT_OTHER): Payer: Self-pay | Admitting: General Surgery

## 2017-01-07 ENCOUNTER — Telehealth: Payer: Self-pay

## 2017-01-07 ENCOUNTER — Telehealth: Payer: Self-pay | Admitting: Gastroenterology

## 2017-01-07 ENCOUNTER — Encounter: Payer: Medicare Other | Admitting: Gastroenterology

## 2017-01-07 NOTE — Telephone Encounter (Signed)
Patient called at 920 to let us know her alarm did not go off. Patient unsure as to when she can arrive at the Endo center. Patient does not have a ride at this time, but states she thinks her boyfriend can bring her and take her home, but she would need to wake him up. Discussed above with Dr. Russella Dar who requests patient to reschedule. Patient informed and will call Christiane Sistare to reschedule.

## 2017-02-24 ENCOUNTER — Ambulatory Visit (AMBULATORY_SURGERY_CENTER): Payer: Self-pay

## 2017-02-24 VITALS — Ht 62.0 in | Wt 151.0 lb

## 2017-02-24 DIAGNOSIS — R1310 Dysphagia, unspecified: Secondary | ICD-10-CM

## 2017-02-24 NOTE — Progress Notes (Signed)
Denies allergies to eggs or soy products. Denies complication of anesthesia or sedation. Denies use of weight loss medication. Denies use of O2.   Emmi instructions declined.  

## 2017-02-27 ENCOUNTER — Encounter: Payer: Self-pay | Admitting: Gastroenterology

## 2017-02-27 ENCOUNTER — Ambulatory Visit (AMBULATORY_SURGERY_CENTER): Payer: Medicare Other | Admitting: Gastroenterology

## 2017-02-27 VITALS — BP 115/72 | HR 81 | Temp 97.3°F | Resp 18 | Ht 62.0 in | Wt 151.0 lb

## 2017-02-27 DIAGNOSIS — K228 Other specified diseases of esophagus: Secondary | ICD-10-CM

## 2017-02-27 DIAGNOSIS — R933 Abnormal findings on diagnostic imaging of other parts of digestive tract: Secondary | ICD-10-CM | POA: Diagnosis not present

## 2017-02-27 DIAGNOSIS — K222 Esophageal obstruction: Secondary | ICD-10-CM

## 2017-02-27 DIAGNOSIS — K219 Gastro-esophageal reflux disease without esophagitis: Secondary | ICD-10-CM

## 2017-02-27 DIAGNOSIS — K2950 Unspecified chronic gastritis without bleeding: Secondary | ICD-10-CM | POA: Diagnosis not present

## 2017-02-27 DIAGNOSIS — R1310 Dysphagia, unspecified: Secondary | ICD-10-CM | POA: Diagnosis not present

## 2017-02-27 MED ORDER — SODIUM CHLORIDE 0.9 % IV SOLN: 500.0000 mL | INTRAVENOUS | Status: DC

## 2017-02-27 NOTE — Progress Notes (Signed)
Pt's states no medical or surgical changes since previsit or office visit. maw 

## 2017-02-27 NOTE — Patient Instructions (Addendum)
YOU HAD AN ENDOSCOPIC PROCEDURE TODAY AT THE [facility]:   Refer to the procedure report that was given to you for any specific questions about what was found during the examination.  If the procedure report does not answer your questions, please call your gastroenterologist to clarify.  If you requested that your care partner not be given the details of your procedure findings, then the procedure report has been included in a sealed envelope for you to review at your convenience later.  YOU SHOULD EXPECT: Some feelings of bloating in the abdomen. Passage of more gas than usual.  Walking can help get rid of the air that was put into your GI tract during the procedure and reduce the bloating. If you had a lower endoscopy (such as a colonoscopy or flexible sigmoidoscopy) you may notice spotting of blood in your stool or on the toilet paper. If you underwent a bowel prep for your procedure, you may not have a normal bowel movement for a few days.  Please Note:  You might notice some irritation and congestion in your nose or some drainage.  This is from the oxygen used during your procedure.  There is no need for concern and it should clear up in a day or so.  SYMPTOMS TO REPORT IMMEDIATELY:   Following upper endoscopy (EGD)  Vomiting of blood or coffee ground material  New chest pain or pain under the shoulder blades  Painful or persistently difficult swallowing  New shortness of breath  Fever of 100F or higher  Black, tarry-looking stools  For urgent or emergent issues, a gastroenterologist can be reached at any hour by calling [phone].   DIET:  We do suggest that you do not have anything by mouth until 4 pm. At 4pm you may have clear liquids.  After 4pm, you may have a soft diet for the rest of today.  Tomorrow, you may have a regular diet  Avoid alcoholic beverages, and NSAIDS.  ACTIVITY:  You should plan to take it easy for the rest of today and you should NOT DRIVE or use heavy machinery until tomorrow (because of the sedation medicines used during the test).    FOLLOW UP: Our staff will call the number listed on your records the next business day following your procedure to check on you and address any questions or concerns that you may have regarding the information given to you following your procedure. If we do not reach you, we will leave a message.  However, if you are feeling well and you are not experiencing any problems, there is no need to return our call.  We will assume that you have returned to your regular daily activities without incident.  If any biopsies were taken you will be contacted by phone or by letter within the next 1-3 weeks.  Please call us at [phone] if you  have not heard about the biopsies in 3 weeks.    SIGNATURES/CONFIDENTIALITY: You and/or your care partner have signed paperwork which will be entered into your electronic medical record.  These signatures attest to the fact that that the information above on your After Visit Summary has been reviewed and is understood.  Full responsibility of the confidentiality of this discharge information lies with you and/or your care-partner.  Read all of the handouts given to you by your recovery room nurse.

## 2017-02-27 NOTE — Progress Notes (Signed)
Called to room to assist during endoscopic procedure.  Patient ID and intended procedure confirmed with present staff. Received instructions for my participation in the procedure from the performing physician.  

## 2017-02-27 NOTE — Progress Notes (Signed)
Pt has albuterol inhaler with pt's label at the bedside. maw

## 2017-02-27 NOTE — Op Note (Signed)
[facility] Patient Name: Connie Murray Procedure Date: 02/27/2017 2:21 PM MRN: [id number] Endoscopist: Meryl Dare , MD Age: 46 Referring MD:  Date of Birth: 06/17/1970 Gender: Female Account #: [id number] Procedure:                Upper GI endoscopy Indications:              Dysphagia, Abnormal esophagram Medicines:                Monitored Anesthesia Care Procedure:                Pre-Anesthesia Assessment:                           - Prior to the procedure, a History and Physical                            was performed, and patient medications and                            allergies were reviewed. The patient's tolerance of                            previous anesthesia was also reviewed. The risks                            and benefits of the procedure and the sedation                            options and risks were discussed with the patient.                            All questions were answered, and informed consent                            was obtained. Prior Anticoagulants: The patient has                            taken no previous anticoagulant or antiplatelet                            agents. ASA Grade Assessment: III - A patient with                            severe systemic disease. After reviewing the risks                            and benefits, the patient was deemed in                            satisfactory condition to undergo the procedure.                           After obtaining informed consent, the endoscope was  passed under direct vision. Throughout the                            procedure, the patient's blood pressure, pulse, and                            oxygen saturations were monitored continuously. The                            Endoscope was introduced through the mouth, and                            advanced to the second part of duodenum. The upper                            GI endoscopy was  accomplished without difficulty.                            The patient tolerated the procedure well. Scope In: Scope Out: Findings:                 Mucosal changes including ringed esophagus, feline                            appearance and longitudinal furrows were found in                            the distal esophagus. Biopsies were taken with a                            cold forceps for histology.                           Several mild benign-appearing, intrinsic stenoses                            were found in the distal esophagus. They measured                            1.3 cm (inner diameter) and were traversed. A                            guidewire was placed and the scope was withdrawn.                            Dilations were performed with a Savary dilators                            with mild resistance at 14 mm, 15 mm and 16 mm.                            Minimal heme on last dilator.  Multiple localized, large non-bleeding erosions                            were found in the gastric antrum, pylorus and in                            the gastric body. There were no stigmata of recent                            bleeding. Biopsies were taken with a cold forceps                            for histology.                           The exam of the stomach was otherwise normal.                           The duodenal bulb and second portion of the                            duodenum were normal. Complications:            No immediate complications. Estimated Blood Loss:     Estimated blood loss was minimal. Impression:               - Esophageal mucosal changes suggestive of                            eosinophilic esophagitis. Biopsied.                           - Benign-appearing esophageal stenoses. Dilated.                           - Non-bleeding erosive gastropathy. Biopsied.                           - Normal duodenal bulb and second portion of  the                            duodenum. Recommendation:           - Patient has a contact number available for                            emergencies. The signs and symptoms of potential                            delayed complications were discussed with the                            patient. Return to normal activities tomorrow.                            Written discharge instructions were provided to the  patient.                           - Clear liquid diet for 2 hours, then advance as                            tolerated to soft diet today. Resume prior diet                            tomorrow.                           - Continue present medications.                           - Await pathology results.                           - No aspirin, ibuprofen, naproxen, or other                            non-steroidal anti-inflammatory drugs. Including BC                            powders. Meryl Dare, MD 02/27/2017 3:19:00 PM This report has been signed electronically.

## 2017-02-27 NOTE — Progress Notes (Signed)
Report given to PACU, vss 

## 2017-02-28 ENCOUNTER — Telehealth: Payer: Self-pay | Admitting: *Deleted

## 2017-02-28 NOTE — Telephone Encounter (Signed)
  Follow up Call-  Call back number 02/27/2017  Post procedure Call Back phone  # #[phone] cell  Permission to leave phone message Yes  Some recent data might be hidden     No answer at # given.  LM on VM.

## 2017-03-03 ENCOUNTER — Telehealth: Payer: Self-pay | Admitting: *Deleted

## 2017-03-03 NOTE — Telephone Encounter (Signed)
Closing Encounter.

## 2017-03-03 NOTE — Telephone Encounter (Signed)
  Follow up Call-  Call back number 02/27/2017  Post procedure Call Back phone  # #[phone] cell  Permission to leave phone message Yes  Some recent data might be hidden     Patient questions:  Do you have a fever, pain , or abdominal swelling? No. Pain Score  0 *  Have you tolerated food without any problems? Yes.    Have you been able to return to your normal activities? Yes.    Do you have any questions about your discharge instructions: Diet   No. Medications  No. Follow up visit  No.  Do you have questions or concerns about your Care? No.  Actions: * If pain score is 4 or above: No action needed, pain <4.

## 2017-03-07 ENCOUNTER — Encounter (HOSPITAL_BASED_OUTPATIENT_CLINIC_OR_DEPARTMENT_OTHER): Payer: Self-pay | Admitting: *Deleted

## 2017-03-12 ENCOUNTER — Encounter: Payer: Self-pay | Admitting: Gastroenterology

## 2017-05-01 ENCOUNTER — Telehealth: Payer: Self-pay

## 2017-05-01 NOTE — Telephone Encounter (Signed)
Found patient's inhaler found in admitting. Attempted to reach patient to let her know, no answer. Left message that we will keep her inhaler in admitting and for her to call us back to let us know when she'll be picking it up.

## 2017-05-01 NOTE — Telephone Encounter (Signed)
Patient returned phone call and says that she will pick up her inhaler tomorrow.

## 2017-09-13 ENCOUNTER — Encounter (HOSPITAL_COMMUNITY): Payer: Self-pay | Admitting: *Deleted

## 2017-09-13 ENCOUNTER — Ambulatory Visit (HOSPITAL_COMMUNITY)
Admission: EM | Admit: 2017-09-13 | Discharge: 2017-09-13 | Disposition: A | Discharge status: Home / Self Care | Payer: Medicare Other | Attending: Family Medicine | Admitting: Family Medicine

## 2017-09-13 DIAGNOSIS — K047 Periapical abscess without sinus: Secondary | ICD-10-CM | POA: Diagnosis not present

## 2017-09-13 MED ORDER — PENICILLIN V POTASSIUM 500 MG PO TABS: 500.0000 mg | ORAL_TABLET | Freq: Three times a day (TID) | ORAL | 0 refills | Status: DC

## 2017-09-13 NOTE — ED Triage Notes (Signed)
Patient states she had 3 teeth extractions in February of this year and this is the third time one of the sites has gotten infected. Patient states she squeezed purulent drainage out of her gums around 1pm this afternoon. The site is swollen and red.

## 2017-09-13 NOTE — Discharge Instructions (Addendum)
Call your dentist on Monday morning for an appointment

## 2017-09-13 NOTE — ED Provider Notes (Signed)
[facility]   [id number] 09/13/17 Arrival Time: 1625   SUBJECTIVE:  Connie Murray is a 47 y.o. female who presents to the urgent care with complaint of dental pain.  She had a tooth #28 extracted in February with 2 subsequent visits to the dentist for with debridement of impacted food.  Over the last several days she is developed swelling in the same area and has been able to express pus.  She called her dentist who put her on clindamycin but this upset her stomach and she stopped it.    Past Medical History:  Diagnosis Date  . Allergy   . Anxiety   . Bipolar disorder (HCC)   . Breast mass, right 12/30/2016  . Cervical spondylosis   . Chronic headaches   . Chronic lower back pain   . Degenerative arthritis   . Depression   . Dyslipidemia   . Fibromyalgia   . GERD (gastroesophageal reflux disease)   . Hyperlipidemia   . Hypertension   . Migraine   . Myalgia and myositis, unspecified 11/27/2012  . Normocytic anemia   . Radiculopathy    C8 or T1   Family History  Problem Relation Age of Onset  . Ovarian cancer Mother   . Alcoholism Father   . Irritable bowel syndrome Unknown   . Bone cancer Maternal Uncle   . Breast cancer Paternal Grandmother   . Brain cancer Maternal Uncle   . Colon cancer Neg Hx   . Esophageal cancer Neg Hx   . Pancreatic cancer Neg Hx   . Rectal cancer Neg Hx   . Stomach cancer Neg Hx   . Prostate cancer Neg Hx    Social History   Socioeconomic History  . Marital status: Single    Spouse name: Not on file  . Number of children: 1  . Years of education: Not on file  . Highest education level: Not on file  Occupational History  . Occupation: Disabled    Employer: DISABLED  Social Needs  . Financial resource strain: Not on file  . Food insecurity:    Worry: Not on file    Inability: Not on file  . Transportation needs:    Medical: Not on file    Non-medical: Not on file  Tobacco Use  . Smoking status: Current Some Day  Smoker    Packs/day: 1.00    Years: 28.00    Pack years: 28.00    Types: Cigarettes    Start date: 12/13/1982    Last attempt to quit: 07/15/2012    Years since quitting: 5.1  . Smokeless tobacco: Never Used  . Tobacco comment: between 1/2 - 1 pack cigs per day  Substance and Sexual Activity  . Alcohol use: No  . Drug use: No  . Sexual activity: Not on file  Lifestyle  . Physical activity:    Days per week: Not on file    Minutes per session: Not on file  . Stress: Not on file  Relationships  . Social connections:    Talks on phone: Not on file    Gets together: Not on file    Attends religious service: Not on file    Active member of club or organization: Not on file    Attends meetings of clubs or organizations: Not on file    Relationship status: Not on file  . Intimate partner violence:    Fear of current or ex partner: Not on file    Emotionally  abused: Not on file    Physically abused: Not on file    Forced sexual activity: Not on file  Other Topics Concern  . Not on file  Social History Narrative  . Not on file   Current Meds  Medication Sig  . Erenumab-aooe (AIMOVIG) 70 MG/ML SOAJ Inject 70 mg into the skin every 30 (thirty) days.  . propranolol (INDERAL) 10 MG tablet Take 30 mg by mouth 2 (two) times daily.   Allergies  Allergen Reactions  . Bee Venom Anaphylaxis  . Clindamycin/Lincomycin Anaphylaxis    Patient states had SOB and throat swelling with Clindamycin.  . Molds & Smuts Shortness Of Breath  . Benadryl [Diphenhydramine Hcl] Other (See Comments)    Contains red dye, to which patient is allergic  . Diclofenac Other (See Comments)    Cause kidney and liver problems  . Nsaids Other (See Comments)    Affects kidney and liver  . Statins Other (See Comments)    Caused liver problems  . Acetaminophen Other (See Comments)    Affects whole body; "makes me feel like crap"  . Codeine Itching    And a really, really bad headache   . Darvocet  [Propoxyphene N-Acetaminophen] Nausea And Vomiting  . Latex Dermatitis and Rash    "Occurs mostly only in groin area."  . Red Dye Itching and Rash    "Red streaks across throat, some itching" "Causes throat to become red and raw"  . Tetracyclines & Related Nausea And Vomiting    "and severe stomach cramps"      ROS: As per HPI, remainder of ROS negative.   OBJECTIVE:   Vitals:   09/13/17 1640  BP: (!) 147/79  Pulse: 97  Temp: 98.3 F (36.8 C)  TempSrc: Oral  SpO2: 99%     General appearance: alert; no distress Eyes: PERRL; EOMI; conjunctiva normal HENT: normocephalic; atraumatic;oral mucosa swollen work tooth #28 was Neck: supple; no adenopathy Back: no CVA tenderness Extremities: no cyanosis or edema; symmetrical with no gross deformities Skin: warm and dry Neurologic: normal gait; grossly normal Psychological: alert and cooperative; normal mood and affect      Labs:  Results for orders placed or performed during the hospital encounter of 12/30/16  CBC WITH DIFFERENTIAL  Result Value Ref Range   WBC 5.8 4.0 - 10.5 K/uL   RBC 4.01 3.87 - 5.11 MIL/uL   Hemoglobin 11.1 (L) 12.0 - 15.0 g/dL   HCT 16.1 (L) 09.6 - 04.5 %   MCV 86.5 78.0 - 100.0 fL   MCH 27.7 26.0 - 34.0 pg   MCHC 32.0 30.0 - 36.0 g/dL   RDW 40.9 (H) 81.1 - 91.4 %   Platelets 405 (H) 150 - 400 K/uL   Neutrophils Relative % 44 %   Neutro Abs 2.6 1.7 - 7.7 K/uL   Lymphocytes Relative 43 %   Lymphs Abs 2.5 0.7 - 4.0 K/uL   Monocytes Relative 10 %   Monocytes Absolute 0.6 0.1 - 1.0 K/uL   Eosinophils Relative 2 %   Eosinophils Absolute 0.1 0.0 - 0.7 K/uL   Basophils Relative 1 %   Basophils Absolute 0.0 0.0 - 0.1 K/uL  Comprehensive metabolic panel  Result Value Ref Range   Sodium 138 135 - 145 mmol/L   Potassium 4.5 3.5 - 5.1 mmol/L   Chloride 103 101 - 111 mmol/L   CO2 27 22 - 32 mmol/L   Glucose, Bld 90 65 - 99 mg/dL   BUN 22 (H)  6 - 20 mg/dL   Creatinine, Ser 8.65 (H) 0.44 - 1.00  mg/dL   Calcium 9.3 8.9 - 78.4 mg/dL   Total Protein 7.3 6.5 - 8.1 g/dL   Albumin 3.5 3.5 - 5.0 g/dL   AST 30 15 - 41 U/L   ALT 21 14 - 54 U/L   Alkaline Phosphatase 40 38 - 126 U/L   Total Bilirubin 0.5 0.3 - 1.2 mg/dL   GFR calc non Af Amer 44 (L) >60 mL/min   GFR calc Af Amer 51 (L) >60 mL/min   Anion gap 8 5 - 15    Labs Reviewed - No data to display  No results found.     ASSESSMENT & PLAN:  1. Dental infection   Call your dentist on Monday morning for an appointment  Meds ordered this encounter  Medications  . penicillin v potassium (VEETID) 500 MG tablet    Sig: Take 1 tablet (500 mg total) by mouth 3 (three) times daily.    Dispense:  30 tablet    Refill:  0    Reviewed expectations re: course of current medical issues. Questions answered. Outlined signs and symptoms indicating need for more acute intervention. Patient verbalized understanding. After Visit Summary given.    Procedures:      Elvina Sidle, MD 09/13/17 1659

## 2017-11-08 ENCOUNTER — Other Ambulatory Visit: Payer: Self-pay | Admitting: Gastroenterology

## 2017-11-10 ENCOUNTER — Other Ambulatory Visit: Payer: Self-pay | Admitting: Gastroenterology

## 2017-12-26 ENCOUNTER — Ambulatory Visit: Payer: Medicare Other | Admitting: Physician Assistant

## 2018-01-16 ENCOUNTER — Other Ambulatory Visit: Payer: Self-pay

## 2018-01-16 ENCOUNTER — Ambulatory Visit (INDEPENDENT_AMBULATORY_CARE_PROVIDER_SITE_OTHER): Payer: Medicare Other | Admitting: Physician Assistant

## 2018-01-16 ENCOUNTER — Other Ambulatory Visit (INDEPENDENT_AMBULATORY_CARE_PROVIDER_SITE_OTHER): Payer: Medicare Other

## 2018-01-16 ENCOUNTER — Encounter: Payer: Self-pay | Admitting: Physician Assistant

## 2018-01-16 VITALS — BP 118/68 | HR 78 | Ht 62.0 in | Wt 143.2 lb

## 2018-01-16 DIAGNOSIS — R1084 Generalized abdominal pain: Secondary | ICD-10-CM

## 2018-01-16 DIAGNOSIS — K589 Irritable bowel syndrome without diarrhea: Secondary | ICD-10-CM

## 2018-01-16 DIAGNOSIS — R1310 Dysphagia, unspecified: Secondary | ICD-10-CM

## 2018-01-16 LAB — CBC WITH DIFFERENTIAL/PLATELET
BASOS ABS: 0.1 10*3/uL (ref 0.0–0.1)
BASOS PCT: 1 % (ref 0.0–3.0)
EOS ABS: 0.2 10*3/uL (ref 0.0–0.7)
Eosinophils Relative: 1.9 % (ref 0.0–5.0)
HEMATOCRIT: 38.2 % (ref 36.0–46.0)
Hemoglobin: 12.5 g/dL (ref 12.0–15.0)
Lymphocytes Relative: 22.3 % (ref 12.0–46.0)
Lymphs Abs: 1.9 10*3/uL (ref 0.7–4.0)
MCHC: 32.8 g/dL (ref 30.0–36.0)
MCV: 90.4 fl (ref 78.0–100.0)
MONOS PCT: 9.8 % (ref 3.0–12.0)
Monocytes Absolute: 0.8 10*3/uL (ref 0.1–1.0)
NEUTROS ABS: 5.5 10*3/uL (ref 1.4–7.7)
NEUTROS PCT: 65 % (ref 43.0–77.0)
PLATELETS: 303 10*3/uL (ref 150.0–400.0)
RBC: 4.23 Mil/uL (ref 3.87–5.11)
RDW: 16.3 % — AB (ref 11.5–15.5)
WBC: 8.5 10*3/uL (ref 4.0–10.5)

## 2018-01-16 LAB — COMPREHENSIVE METABOLIC PANEL
ALT: 16 U/L (ref 0–35)
AST: 26 U/L (ref 0–37)
Albumin: 4.1 g/dL (ref 3.5–5.2)
Alkaline Phosphatase: 31 U/L — ABNORMAL LOW (ref 39–117)
BILIRUBIN TOTAL: 0.3 mg/dL (ref 0.2–1.2)
BUN: 21 mg/dL (ref 6–23)
CALCIUM: 9.5 mg/dL (ref 8.4–10.5)
CHLORIDE: 103 meq/L (ref 96–112)
CO2: 31 meq/L (ref 19–32)
Creatinine, Ser: 1.55 mg/dL — ABNORMAL HIGH (ref 0.40–1.20)
GFR: 37.68 mL/min — AB (ref >60.00)
GLUCOSE: 92 mg/dL (ref 70–99)
Potassium: 5.1 mEq/L (ref 3.5–5.1)
Sodium: 139 mEq/L (ref 135–145)
Total Protein: 7.3 g/dL (ref 6.0–8.3)

## 2018-01-16 LAB — SEDIMENTATION RATE: Sed Rate: 6 mm/hr (ref 0–20)

## 2018-01-16 LAB — HIGH SENSITIVITY CRP: CRP HIGH SENSITIVITY: 1.5 mg/L (ref 0.000–5.000)

## 2018-01-16 MED ORDER — PANTOPRAZOLE SODIUM 40 MG PO TBEC: 40.0000 mg | DELAYED_RELEASE_TABLET | Freq: Every day | ORAL | 11 refills | Status: DC

## 2018-01-16 MED ORDER — HYOSCYAMINE SULFATE SL 0.125 MG SL SUBL: SUBLINGUAL_TABLET | SUBLINGUAL | 6 refills | Status: DC

## 2018-01-16 NOTE — Progress Notes (Signed)
Subjective:    Patient ID: Connie Murray, female    DOB: [birth year], 47 y.o.   MRN: [id number]  HPI Connie Murray is a 47 year old white female, known to Dr. Russella Dar who was last seen in October 2018 when she underwent EGD.  She was found to have a ringed esophagus with linear furrows were several mild stenoses in the distal esophagus and she was Savary dilated from 14 to 16 mm.  Also noted to have multiple large gastric erosions in the antrum and pylorus. Biopsies from the esophagus showed reflux changes only no evidence for eosinophilic gastroenteritis and had chronic gastritis. Last colonoscopy 01/19/12done for iron deficiency anemia with finding of mild inflammation at the ileocecal valve there was focal erosion and friability there is also a small ulcer at the hepatic flexure.  Biopsy showed multiple inflammatory changes reactive biopsy from the hepatic flexure ulceration showed chronic ischemia.   Patient has history of GERD, ventral hernia, bipolar disorder and chronic anxiety. She comes in today stating that she is been having mid and upper abdominal pain over the past 6 to 8 months.  Her mother passed away in 2019-01-19and she says her symptoms have worsened since then.  She feels that she has had gradual progression of her symptoms over the past several months scribes episodes of severe mid abdominal pain that can be "worse than labor pains", and double her over at times.  Patient is anxious and tearful while she is describing her symptoms and says she has had very similar symptoms ever since she was a child She says even back at that time her symptoms would usually last 2 or 3 days at a time would be associated with some nausea inability to eat and alternating bowel habits.  She is been having similar episodes over the past several months.  She also has a component of abdominal discomfort that never goes away.  Says the only thing that has helped her symptoms in the past with Xanax and she had  been on Xanax for many many years till about a year ago when her primary care doctor left his practice.  She is been seen by the nurse practitioner there since who had discontinued her Xanax.  She was able to be restarted on Xanax in July 2019 by her pain management physician and is now taking 0.5 mg twice daily which was a lower dose than what she been on for many years. She says she has a lot of ongoing anxiety, PTSD from childhood experiences and has been very anxious recently because she has a granddaughter living with her who has autism and Tourette's who is very difficult to deal with and lashes out at her frequently.  She has not had any documented weight loss.  She believes that she has a ventral hernia but does not feel that her pain is coming from the hernia though she would like to talk to a surgeon about having this repaired.  Review of Systems Pertinent positive and negative review of systems were noted in the above HPI section.  All other review of systems was otherwise negative.  Outpatient Encounter Medications as of 01/16/2018  Medication Sig  . albuterol (PROVENTIL HFA;VENTOLIN HFA) 108 (90 BASE) MCG/ACT inhaler Inhale 2 puffs into the lungs every 6 (six) hours as needed for wheezing or shortness of breath.  . alprazolam (XANAX) 2 MG tablet Take 1 tablet (2 mg total) by mouth at bedtime as needed for anxiety (3-4 times daily). (  Patient taking differently: Take 1 mg by mouth at bedtime as needed for anxiety (3-4 times daily). )  . amitriptyline (ELAVIL) 50 MG tablet Take 1 tablet by mouth at bedtime.  Marland Kitchen amphetamine-dextroamphetamine (ADDERALL) 20 MG tablet Take 0.5 tablets by mouth 2 (two) times daily as needed.   . Ascorbic Acid (VITAMIN C) 1000 MG tablet Take 1,000 mg by mouth daily.  . B Complex-C (B-COMPLEX WITH VITAMIN C) tablet Take 2 tablets by mouth daily.   . chlorothiazide (DIURIL) 250 MG tablet Take 250 mg by mouth every morning.  . Cholecalciferol (VITAMIN D PO) Take 1  tablet by mouth daily.  Dorise Hiss (AIMOVIG) 70 MG/ML SOAJ Inject 140 mg into the skin every 30 (thirty) days.   Marland Kitchen estradiol (ESTRACE) 2 MG tablet Take 2 mg by mouth daily.  Marland Kitchen ezetimibe (ZETIA) 10 MG tablet Take 1 tablet (10 mg total) by mouth daily.  . Fenofibrate 150 MG CAPS Take 1 capsule by mouth daily.   . fexofenadine (ALLEGRA) 180 MG tablet Take 180 mg by mouth daily.  . hydrOXYzine (ATARAX/VISTARIL) 25 MG tablet Take 25 mg by mouth 2 (two) times daily.  . metaxalone (SKELAXIN) 800 MG tablet Take 800 mg by mouth 3 (three) times daily.  . methadone (DOLOPHINE) 5 MG tablet Take 1 tablet 3-4 times a day for 30 days  . Multiple Vitamins-Minerals (ONE DAILY MENS 50+ MULTIVIT PO) Take 1 tablet by mouth daily.  Marland Kitchen omeprazole (PRILOSEC) 40 MG capsule Take 40 mg by mouth 2 (two) times daily.  . OnabotulinumtoxinA, Cosmetic, (BOTOX COSMETIC IM) Inject into the muscle every 3 (three) months.  . potassium chloride SA (K-DUR,KLOR-CON) 20 MEQ tablet Take 20 mEq by mouth every 8 (eight) hours. bid  . propranolol (INDERAL) 10 MG tablet Take 30 mg by mouth 2 (two) times daily.  . QUEtiapine (SEROQUEL XR) 300 MG 24 hr tablet Take 1 tablet (300 mg total) by mouth at bedtime.  . SUMAtriptan (IMITREX) 100 MG tablet Take 100 mg by mouth as needed for migraine.   . TESTOSTERONE IM Inject into the muscle every 30 (thirty) days.  . [DISCONTINUED] estradiol (ESTRACE) 0.5 MG tablet Take 2 mg by mouth daily.   Marland Kitchen EPINEPHrine (EPIPEN) 0.3 mg/0.3 mL SOAJ Inject 0.3 mLs (0.3 mg total) into the muscle once. (Patient not taking: Reported on 02/27/2017)  . Hyoscyamine Sulfate SL (LEVSIN/SL) 0.125 MG SUBL Dissolve one tablet on the tongue every 6 hours for cramping and spasms.  . [DISCONTINUED] Omega-3 Fatty Acids (FISH OIL PO) Take 1 tablet by mouth daily.  . [DISCONTINUED] omeprazole (PRILOSEC) 40 MG capsule TAKE 1 CAPSULE BY MOUTH TWICE DAILY  . [DISCONTINUED] penicillin v potassium (VEETID) 500 MG tablet Take 1  tablet (500 mg total) by mouth 3 (three) times daily.   No facility-administered encounter medications on file as of 01/16/2018.    Allergies  Allergen Reactions  . Bee Venom Anaphylaxis  . Clindamycin/Lincomycin Anaphylaxis    Patient states had SOB and throat swelling with Clindamycin.  . Molds & Smuts Shortness Of Breath  . Benadryl [Diphenhydramine Hcl] Other (See Comments)    Contains red dye, to which patient is allergic  . Diclofenac Other (See Comments)    Cause kidney and liver problems  . Nsaids Other (See Comments)    Affects kidney and liver  . Statins Other (See Comments)    Caused liver problems  . Acetaminophen Other (See Comments)    Affects whole body; "makes me feel like crap"  .  Codeine Itching    And a really, really bad headache   . Darvocet [Propoxyphene N-Acetaminophen] Nausea And Vomiting  . Latex Dermatitis and Rash    "Occurs mostly only in groin area."  . Red Dye Itching and Rash    "Red streaks across throat, some itching" "Causes throat to become red and raw"  . Tetracyclines & Related Nausea And Vomiting    "and severe stomach cramps"   Patient Active Problem List   Diagnosis Date Noted  . Breast mass, right 12/30/2016  . Back pain, chronic 12/09/2012  . Bipolar 1 disorder, mixed, moderate (HCC) 12/09/2012  . Asthma, chronic 12/09/2012  . Fibromyalgia 12/09/2012  . Migraine, unspecified, 12/09/2012  . Myalgia and myositis, unspecified 11/27/2012  . Ventral hernia-supraumbilical 09/23/2012  . IRON DEFICIENCY 04/18/2010  . UNSPECIFIED ANEMIA 04/18/2010  . BLOOD IN STOOL 04/18/2010  . GERD 01/06/2008  . CONSTIPATION 01/06/2008   Social History   Socioeconomic History  . Marital status: Single    Spouse name: Not on file  . Number of children: 1  . Years of education: Not on file  . Highest education level: Not on file  Occupational History  . Occupation: Disabled    Employer: DISABLED  Social Needs  . Financial resource strain: Not  on file  . Food insecurity:    Worry: Not on file    Inability: Not on file  . Transportation needs:    Medical: Not on file    Non-medical: Not on file  Tobacco Use  . Smoking status: Current Some Day Smoker    Packs/day: 1.00    Years: 28.00    Pack years: 28.00    Types: Cigarettes    Start date: 12/13/1982    Last attempt to quit: 07/15/2012    Years since quitting: 5.5  . Smokeless tobacco: Never Used  . Tobacco comment: between 1/2 - 1 pack cigs per day  Substance and Sexual Activity  . Alcohol use: No  . Drug use: No  . Sexual activity: Not on file  Lifestyle  . Physical activity:    Days per week: Not on file    Minutes per session: Not on file  . Stress: Not on file  Relationships  . Social connections:    Talks on phone: Not on file    Gets together: Not on file    Attends religious service: Not on file    Active member of club or organization: Not on file    Attends meetings of clubs or organizations: Not on file    Relationship status: Not on file  . Intimate partner violence:    Fear of current or ex partner: Not on file    Emotionally abused: Not on file    Physically abused: Not on file    Forced sexual activity: Not on file  Other Topics Concern  . Not on file  Social History Narrative  . Not on file    Ms. Stocks's family history includes Alcoholism in her father; Bone cancer in her maternal uncle; Brain cancer in her maternal uncle; Breast cancer in her paternal grandmother; Irritable bowel syndrome in her unknown relative; Ovarian cancer in her mother.      Objective:    Vitals:   01/16/18 1012  BP: 118/68  Pulse: 78    Physical Exam; well-developed white female in no acute distress, pleasant, anxious talkative and tearful at times blood pressure 118/68 pulse 78, height 5 foot 2, weight 143, BMI 26.1.  HEENT; nontraumatic normocephalic EOMI PERRLA sclera anicteric buccal mucosa moist, Cardiovascular; regular rate and rhythm with S1-S2 no murmur  rub or gallop, Pulmonary ;clear bilaterally, Abdomen; soft, basically nontender there is some minimal tenderness with palpation in the area of the paraumbilical ventral hernia.  No palpable mass or hepatosplenomegaly guarding or rebound bowel sounds present, Rectal; exam not done, Extremities; no clubbing cyanosis or edema skin warm and dry, Neuro psych, alert and oriented, grossly nonfocal affect anxious and tearful       Assessment & Plan:   #49 47 year old white female with long-standing history of abdominal pain which can tend to be episodic evidently seems to be triggered by stress and anxiety.  Patient says she has history of PTSD.  For many years the only medication that has helped this pain has been Xanax. I think her symptoms are functional and related to overwhelming stress and anxiety in her life.  She likely also has IBS.  2 history of chronic gastritis 3.  GERD 4.  Colon cancer screening-up-to-date last colonoscopy 2012 5.  Small ventral hernia 6.  Bipolar disorder  Plan; CBC with differential, CMET,sed rate Start Levsin sublingual, advised her to take 1 every morning and then repeat every 6 to 8 hours as needed Hopefully this will ward off some of her more severe episodes Start IBgard 2 p.o. twice daily Not sure why she has not been on a PPI, will restart Protonix 40 mg p.o. every morning chronically We discussed CT imaging and I think this will be low yield and will hold off for now and see how she does with the addition of antispasmodic.  She was given the number for [facility] today and she will make herself an appointment.  She also intends to get reestablished with [facility].   Connie Murray S Gladyse Corvin PA-C 01/16/2018   Cc: No ref. provider found

## 2018-01-16 NOTE — Patient Instructions (Addendum)
We [facility] [address].  1. Levsin Sl.  We have given you samples of IBGARD.  We also gave you a coupon. You can get this over the counter. [facility], [address].  Take 2 capsules up to 3 times a day.    The number for Behavioral [facility] is [phone].   Normal BMI (Body Mass Index- based on height and weight) is between 19 and 25. Your BMI today is Body mass index is 26.19 kg/m. Marland Kitchen. Please consider follow up  regarding your BMI with your Primary Care Provider.

## 2018-01-20 NOTE — Progress Notes (Signed)
Reviewed and agree with initial management plan.  Laddie Naeem T. Dilan Fullenwider, MD FACG 

## 2018-01-22 ENCOUNTER — Telehealth: Payer: Self-pay | Admitting: Physician Assistant

## 2018-01-23 NOTE — Telephone Encounter (Signed)
Spoke to patient and advised her I also got a denial for the Levsin SL. ( Hyoscyamine). Told her I would ask Amy Esterwood PA if there is anything else she suggests.

## 2018-01-26 NOTE — Telephone Encounter (Signed)
Also when I spoke to the patient on Friday 01-23-2018, she did mention having bad episodes of abdominal pain and cramping.  I advised I would spek to Amy Esterwood early in the week when she is in the office. I told her if the pain is severe she should consider going to the ER if need be.

## 2018-01-27 ENCOUNTER — Telehealth: Payer: Self-pay | Admitting: *Deleted

## 2018-01-27 ENCOUNTER — Other Ambulatory Visit: Payer: Self-pay | Admitting: *Deleted

## 2018-01-27 MED ORDER — DICYCLOMINE HCL 10 MG PO CAPS: ORAL_CAPSULE | ORAL | 2 refills | Status: DC

## 2018-01-27 NOTE — Telephone Encounter (Signed)
LM for the patient on her cell phone. Advised that we sent a prescription for Bentyl ( Dicyclomine) 10 mg. This is an anti-spasmodic and may help with the abdominal pains and cramping. Advised she is to take 1 tablet twice daily as needed for pain and cramping and spasms. Per Mike Gip PA.Sent prescription to [facility]. [facility] and [facility].

## 2018-01-28 ENCOUNTER — Telehealth: Payer: Self-pay | Admitting: *Deleted

## 2018-01-28 ENCOUNTER — Other Ambulatory Visit: Payer: Self-pay | Admitting: *Deleted

## 2018-01-28 MED ORDER — DICYCLOMINE HCL 10 MG PO CAPS: ORAL_CAPSULE | ORAL | 2 refills | Status: DC

## 2018-01-28 NOTE — Telephone Encounter (Signed)
Pam , please change her to bentyl  10 mg po TID as needed  # 90/4

## 2018-01-28 NOTE — Telephone Encounter (Signed)
Called [facility] [address]  Lm for them to change the script for Bentyl 10 mg to 3 times daily per Mike Gip PA.

## 2018-03-09 ENCOUNTER — Other Ambulatory Visit: Payer: Self-pay | Admitting: Gastroenterology

## 2018-06-06 ENCOUNTER — Other Ambulatory Visit: Payer: Self-pay | Admitting: Gastroenterology

## 2018-06-23 ENCOUNTER — Ambulatory Visit: Payer: Medicare Other | Admitting: Gastroenterology

## 2018-07-02 ENCOUNTER — Ambulatory Visit: Payer: 59 | Admitting: Gastroenterology

## 2018-07-02 ENCOUNTER — Encounter

## 2018-07-02 ENCOUNTER — Telehealth: Payer: Self-pay | Admitting: Gastroenterology

## 2018-07-02 NOTE — Telephone Encounter (Signed)
Late cancellation due to having a car problem

## 2018-07-02 NOTE — Telephone Encounter (Signed)
Do you want to charge? 

## 2018-07-02 NOTE — Telephone Encounter (Signed)
No charge this time. 

## 2018-07-04 ENCOUNTER — Other Ambulatory Visit: Payer: Self-pay | Admitting: Gastroenterology

## 2018-09-04 ENCOUNTER — Other Ambulatory Visit: Payer: Self-pay | Admitting: Gastroenterology

## 2018-10-11 IMAGING — MG MM PLC BREAST LOC DEV 1ST LESION INC*R*
7 series · 7 of 7 positions shown · non-contrast
Comparison: Previous exam(s).

CLINICAL DATA: Patient with palpable right breast mass, for
excisional biopsy.

EXAM:
MAMMOGRAPHIC GUIDED RADIOACTIVE SEED LOCALIZATION OF THE RIGHT
BREAST

[R ML (1 of 4)]
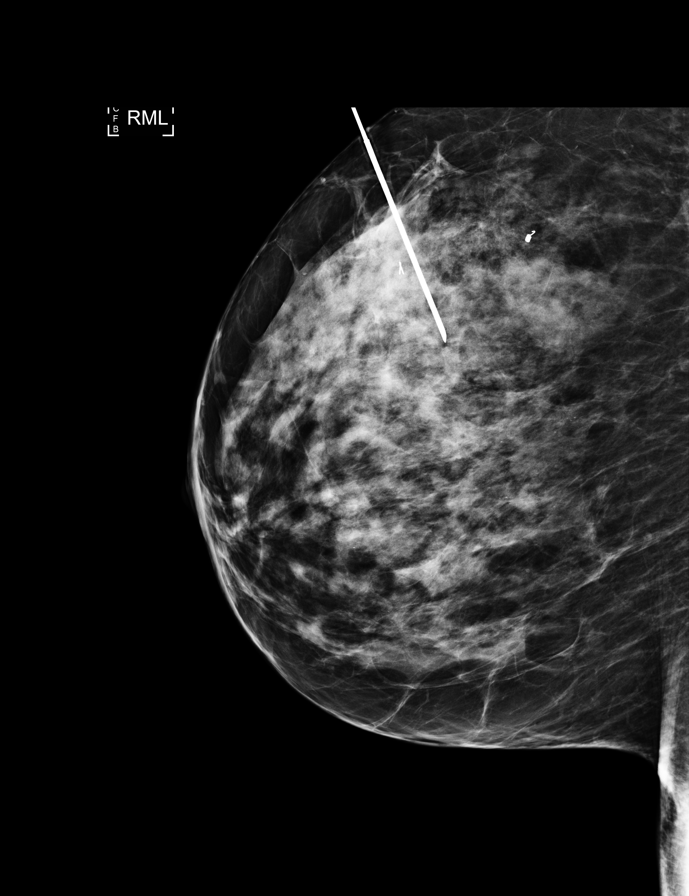

[R ML (2 of 4)]
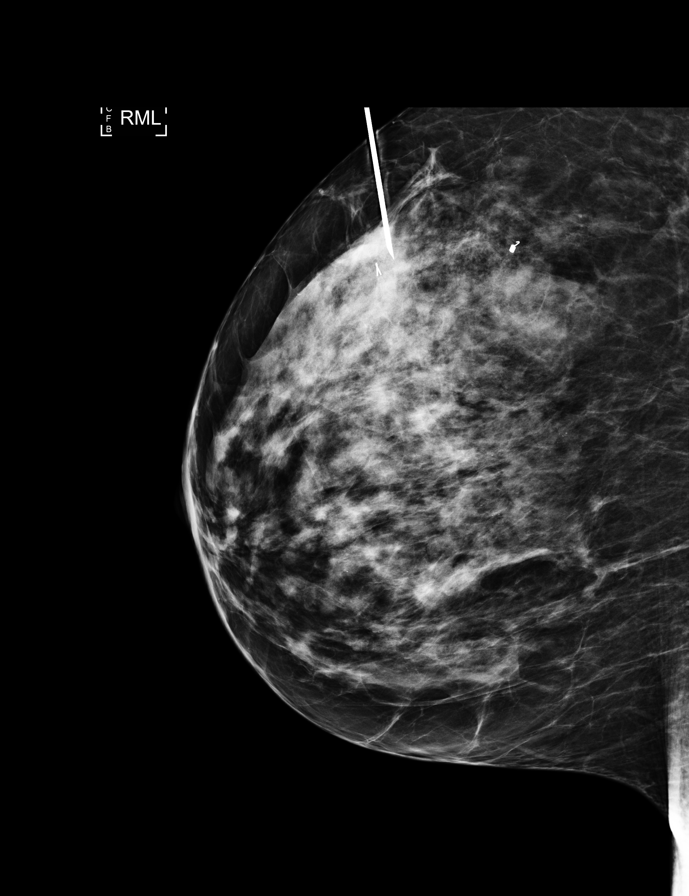

[R ML (3 of 4)]
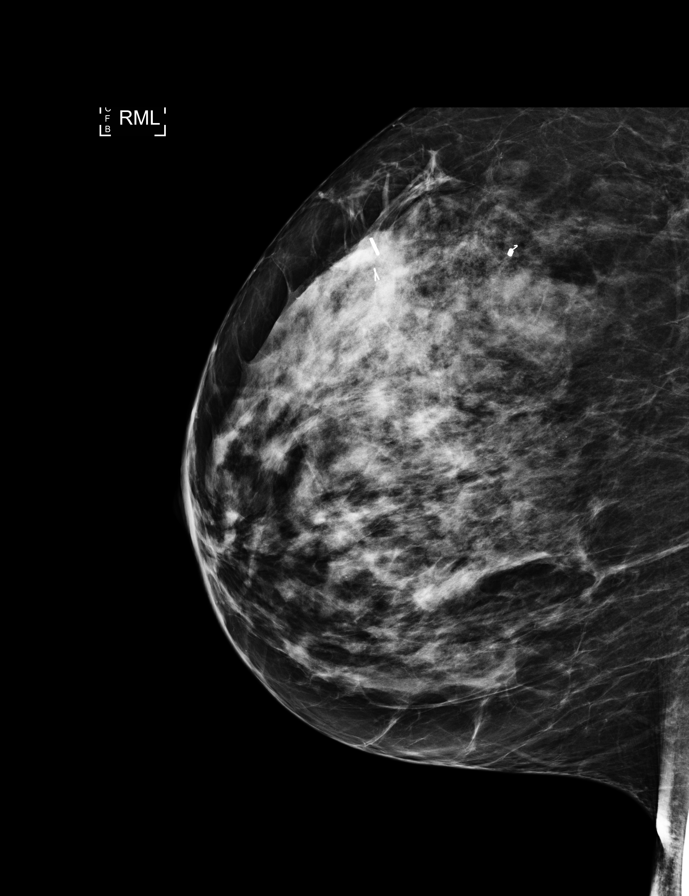

[R CC (1 of 3)]
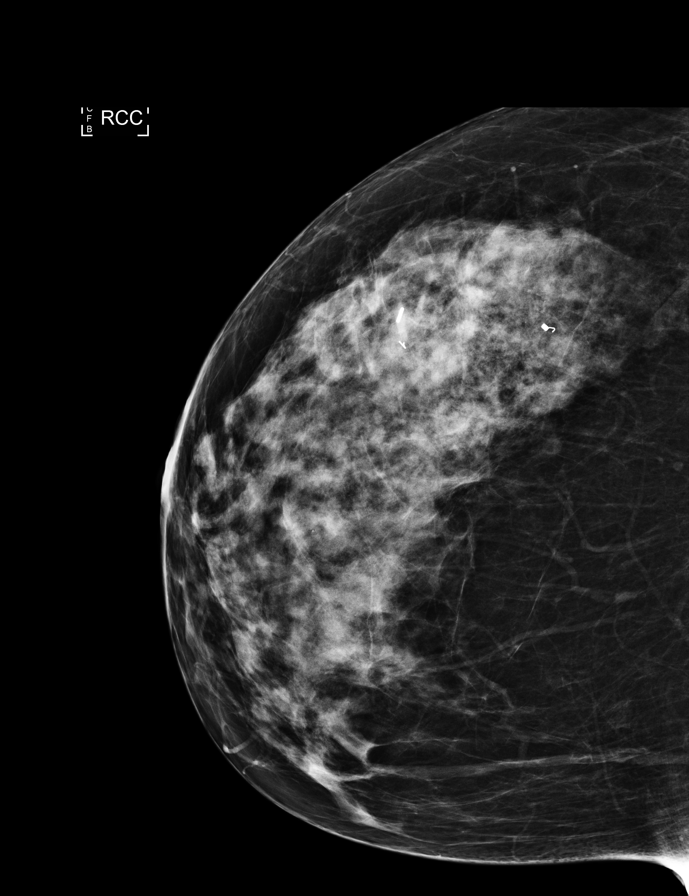

[R CC (2 of 3)]
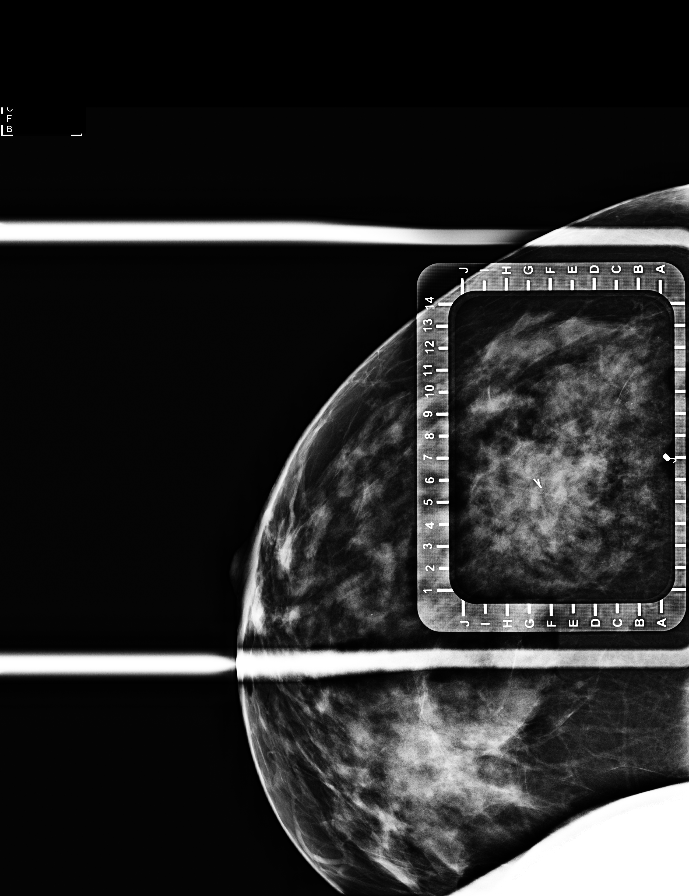

[R ML (4 of 4)]
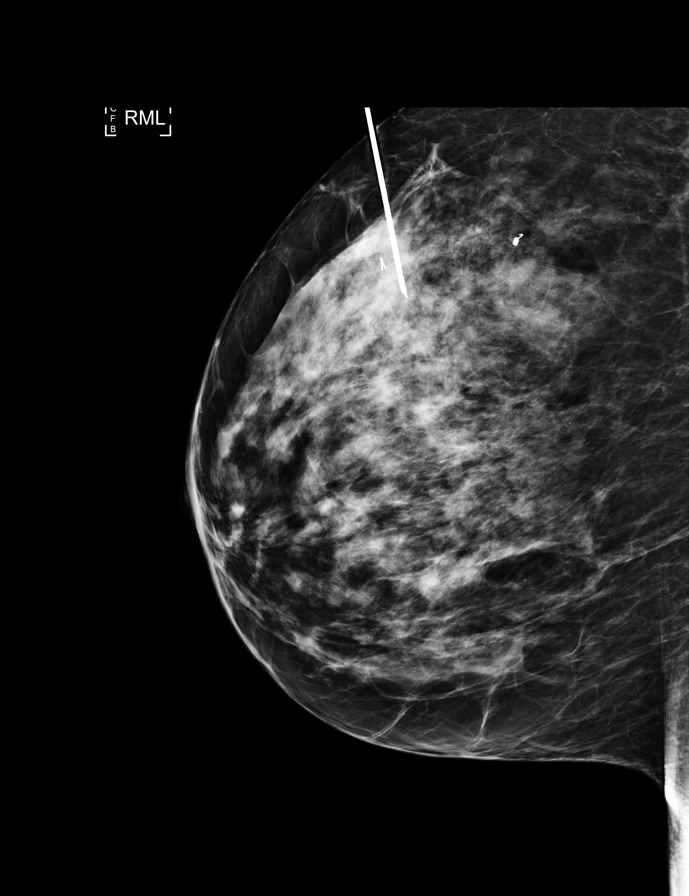

[R CC (3 of 3)]
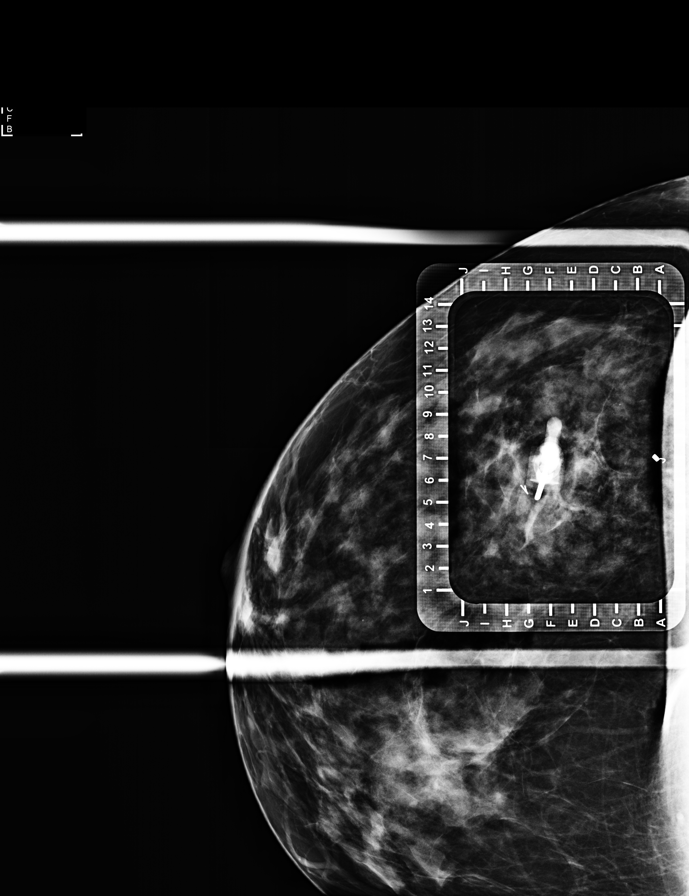

[7 of 7 positions shown; findings below may reference images not displayed]

FINDINGS: Patient presents for radioactive seed localization prior to right
breast excisional biopsy. I met with the patient and we discussed
the procedure of seed localization including benefits and
alternatives. We discussed the high likelihood of a successful
procedure. We discussed the risks of the procedure including
infection, bleeding, tissue injury and further surgery. We discussed
the low dose of radioactivity involved in the procedure. Informed,
written consent was given.

The usual time-out protocol was performed immediately prior to the
procedure.

Using mammographic guidance, sterile technique, 1% lidocaine and an
[id number] radioactive seed, ribbon shaped marking clip within the
upper-outer right breast was localized using a cranial approach. The
follow-up mammogram images confirm the seed in the expected location
and were marked for Dr. Cruz Mota.

Follow-up survey of the patient confirms presence of the radioactive
seed.

Order number of [id number] seed:  [id number].

Total activity:  0.248 millicuries  Reference Date: 01/26/2017

The patient tolerated the procedure well and was released from the
[REDACTED]. She was given instructions regarding seed removal.
IMPRESSION: Radioactive seed localization right breast. No apparent
complications.

## 2018-11-16 ENCOUNTER — Other Ambulatory Visit: Payer: Self-pay | Admitting: Physician Assistant

## 2018-12-03 ENCOUNTER — Other Ambulatory Visit: Payer: Self-pay | Admitting: Gastroenterology

## 2019-01-06 ENCOUNTER — Other Ambulatory Visit: Payer: Self-pay | Admitting: General Surgery

## 2019-01-06 DIAGNOSIS — K420 Umbilical hernia with obstruction, without gangrene: Secondary | ICD-10-CM

## 2019-01-22 ENCOUNTER — Ambulatory Visit
Admission: RE | Admit: 2019-01-22 | Discharge: 2019-01-22 | Disposition: A | Discharge status: Home / Self Care | Payer: 59 | Source: Ambulatory Visit | Attending: General Surgery | Admitting: General Surgery

## 2019-01-22 DIAGNOSIS — K420 Umbilical hernia with obstruction, without gangrene: Secondary | ICD-10-CM

## 2019-01-22 MED ORDER — IOPAMIDOL (ISOVUE-300) INJECTION 61%
75.0000 mL | Freq: Once | INTRAVENOUS | Status: AC | PRN
  Administered 2019-01-22: 75 mL via INTRAVENOUS

## 2019-02-17 ENCOUNTER — Other Ambulatory Visit: Payer: Self-pay | Admitting: General Surgery

## 2019-03-03 ENCOUNTER — Other Ambulatory Visit: Payer: Self-pay | Admitting: Gastroenterology

## 2019-03-11 ENCOUNTER — Other Ambulatory Visit: Payer: Self-pay | Admitting: Obstetrics & Gynecology

## 2019-03-11 DIAGNOSIS — Z1231 Encounter for screening mammogram for malignant neoplasm of breast: Secondary | ICD-10-CM

## 2019-03-11 NOTE — H&P (Signed)
Connie Murray  Location: [facility] Patient #: [id number] DOB: 1971/03/30 Single / Language: [place] / Race: White Female      History of Present Illness   This is a 48 year old female who returns requesting surgery for a painful incarcerated supraumbilical ventral hernia. Dr. Aldona Bar is her gynecologist. She is followed by a pain clinic in [facility] because of chronic pain management. She has seen Dr. Russella Dar for GI problems in the past. Acey Lav was my chaperone throughout the encounter  In 2018 I performed a breast biopsy which turned out to have fibrocystic change and patch. She failed follow-up. She presented on January 01, 2019 study she had a painful umbilical hernia without GI disturbance. She's had upper and lower endoscopy in the past. She says she has sharp pain in her umbilicus. Unbearable and getting worse. No imaging studies were done so I got a CT scan which shows a small midline hernia above the umbilicus with some incarcerated fat. She wants to have this repaired.  Past history significant for TAH and BSO. Daily tobacco use. Bipolar wand disorder. Fibromyalgia. Migraines. PTSD. Internal bowel syndrome. ADD. Takes methadone. Asthma. Esophageal stricture. Prior breast biopsy Single with 1 child. The child is autistic and may have Tourette syndrome. Smokes cigarettes. Takes numerous medications  On exam she has a scar at the upper rim of the umbilicus but she says that is a birthmark. Pfannenstiel incision. I still do not have any office notes from her pain management doctor in [place]. Told her that she would need to see the pain management doctor and I will have to have a current office note from him managing his care. I told her that postop her pain management doctor would have to manage all pain medications so as to avoid duplication or overmedication. She completely agrees with this.  Once we get documentation that she has a supervising  physician managing her chronic pain we will schedule her for surgery She'll be scheduled for laparoscopic repair of incarcerated epigastric hernia with mesh. Possible open repair I discussed the indications, details, techniques, and numerous risk of the surgery with her. She is aware of the risk of bleeding, infection, conversion to open laparotomy, recurrence of the hernia, rare but devastating complication of injury to the intestine with reconstructive surgery. Cardiac pulmonary and thromboembolic problems she understands all these issues. All of her questions are answered. She agrees with this plan.  Most likely we will keep her overnight one night due to uncertain social situation in the home We will fill out her posting sheet and orders once we hear from her pain management doctor I told her that I was retiring in January and so that if she wanted me to do the surgery to go ahead and get things in order.   Past Surgical History Breast Biopsy  Right. multiple Cesarean Section - Multiple  Colon Polyp Removal - Colonoscopy  Foot Surgery  Right. Hysterectomy (due to cancer) - Complete  Hysterectomy (not due to cancer) - Complete  Oral Surgery  Sentinel Lymph Node Biopsy   Diagnostic Studies History  Colonoscopy  1-5 years ago Mammogram  within last year Pap Smear  1-5 years ago  Allergies  Acetaminophen *ANALGESICS - NonNarcotic*  Codeine Phosphate *ANALGESICS - OPIOID*  Diclofenac *ANALGESICS - ANTI-INFLAMMATORY*  Benadryl *ANTIHISTAMINES*  Latex Exam Gloves *MEDICAL DEVICES AND SUPPLIES*  Bis Subcit-Metronid-Tetracyc  BEE VENOM  Mold Extract *ALLERGENIC EXTRACTS/BIOLOGICALS MISC*  Allergies Reconciled   Medication History  Methadone HCl (5MG   Tablet, Oral) Active. OxyCODONE HCl (10MG  Tablet, Oral) Active. ALPRAZolam (2MG  Tablet, Oral) Active. Amitriptyline HCl (25MG  Tablet, Oral) Active. Amitriptyline HCl (50MG  Tablet, Oral) Active. Estradiol  (2MG  Tablet, Oral) Active. Ezetimibe (10MG  Tablet, Oral) Active. Fenofibrate (150MG  Capsule, Oral) Active. Furosemide (40MG  Tablet, Oral) Active. Omeprazole (40MG  Capsule DR, Oral) Active. Potassium Chloride Crys ER (20MEQ Tablet ER, Oral) Active. Promethazine HCl (25MG  Tablet, Oral) Active. QUEtiapine Fumarate ER (300MG  Tablet ER 24HR, Oral) Active. SUMAtriptan Succinate (100MG  Tablet, Oral) Active. TiZANidine HCl (4MG  Tablet, Oral) Active. Welchol (3.75GM Packet, Oral) Active. Methocarbamol (500MG  Tablet, Oral) Active. HYDROmorphone HCl (2MG  Tablet, Oral) Active. Medications Reconciled  Social History  Alcohol use  Occasional alcohol use. Illicit drug use  Prefer to discuss with provider. No caffeine use  Tobacco use  Current some day smoker.  Family History  Brother, Father. Arthritis  Family Members In General, Father. Bleeding disorder  Family Members In General, Mother. Breast Cancer  Family Members In General. Cancer  Family Members In General. Cerebrovascular Accident  Mother. Cervical Cancer  Family Members In General, Mother. Colon Polyps  Mother. Depression  Family Members In General, Mother. Heart Disease  Brother, Family Members In General, Father. Heart disease in female family member before age 47  Hypertension  Brother, Father, Mother. Ischemic Bowel Disease  Family Members In General, Mother. Melanoma  Family Members In General. Migraine Headache  Family Members In General, Mother. Ovarian Cancer  Mother. Respiratory Condition  Father, Mother. Seizure disorder  Mother.  Pregnancy / Birth History  Age at menarche  1 years. Contraceptive History  Contraceptive implant, Oral contraceptives. Gravida  2 Irregular periods  Maternal age  34-20 Para  1  Other Problems  Anxiety Disorder  Arthritis  Asthma  Back Pain  Chronic Obstructive Lung Disease  Crohn's Disease  Depression  Gastroesophageal Reflux  Disease  Hypercholesterolemia  Inguinal Hernia  Lump In Breast  Migraine Headache  Oophorectomy  Bilateral. Other disease, cancer, significant illness  Ulcerative Colitis     Review of Systems  General Present- Fatigue and Weight Gain. Not Present- Appetite Loss, Chills, Fever, Night Sweats and Weight Loss. Skin Present- Dryness. Not Present- Change in Wart/Mole, Hives, Jaundice, New Lesions, Non-Healing Wounds, Rash and Ulcer. HEENT Present- Hoarseness, Nose Bleed, Ringing in the Ears, Seasonal Allergies, Sore Throat, Visual Disturbances and Wears glasses/contact lenses. Not Present- Earache, Hearing Loss, Oral Ulcers, Sinus Pain and Yellow Eyes. Respiratory Present- Difficulty Breathing and Wheezing. Not Present- Bloody sputum, Chronic Cough and Snoring. Breast Present- Breast Mass, Breast Pain and Nipple Discharge. Not Present- Skin Changes. Cardiovascular Present- Leg Cramps, Rapid Heart Rate, Shortness of Breath and Swelling of Extremities. Not Present- Chest Pain, Difficulty Breathing Lying Down and Palpitations. Gastrointestinal Present- Abdominal Pain, Bloating, Bloody Stool, Constipation, Difficulty Swallowing, Gets full quickly at meals, Indigestion and Nausea. Not Present- Change in Bowel Habits, Chronic diarrhea, Excessive gas, Hemorrhoids, Rectal Pain and Vomiting. Female Genitourinary Present- Pelvic Pain. Not Present- Frequency, Nocturia, Painful Urination and Urgency. Musculoskeletal Present- Back Pain, Joint Pain, Joint Stiffness, Muscle Pain, Muscle Weakness and Swelling of Extremities. Neurological Present- Decreased Memory, Headaches, Numbness, Tingling, Tremor, Trouble walking and Weakness. Not Present- Fainting and Seizures. Psychiatric Present- Anxiety and Bipolar. Not Present- Change in Sleep Pattern, Depression, Fearful and Frequent crying. Endocrine Present- Heat Intolerance and Hot flashes. Not Present- Cold Intolerance, Excessive Hunger, Hair Changes and New  Diabetes. Hematology Present- Easy Bruising. Not Present- Blood Thinners, Excessive bleeding, Gland problems, HIV and Persistent Infections.  Vitals Weight: 153.13 lb Height: 62in Body  Surface Area: 1.71 m Body Mass Index: 28.01 kg/m  Temp.: 97.2F  Pulse: 97 (Regular)  P.OX: 100% (Room air) BP: 118/72 (Sitting, Left Arm, Standard)       Physical Exam General Mental Status-Alert. General Appearance-Not in acute distress. Build & Nutrition-Well nourished. Posture-Normal posture. Gait-Normal. Note: Cooperative. She is to have good insight. Very talkative and almost pressured speech. A little bit hyperactive   Head and Neck Head-normocephalic, atraumatic with no lesions or palpable masses. Trachea-midline. Thyroid Gland Characteristics - normal size and consistency and no palpable nodules.  Chest and Lung Exam Chest and lung exam reveals -on auscultation, normal breath sounds, no adventitious sounds and normal vocal resonance.  Cardiovascular Cardiovascular examination reveals -normal heart sounds, regular rate and rhythm with no murmurs and femoral artery auscultation bilaterally reveals normal pulses, no bruits, no thrills.  Abdomen Note: Mild obesity. Pfannenstiel incision well healed. It looks like a scar above the umbilicus but she says this is a birthmark. She is tender in the midline above the umbilicus but not much of a mass. No organomegaly. No distention. No tenderness elsewhere.   Neurologic Neurologic evaluation reveals -alert and oriented x 3 with no impairment of recent or remote memory, normal attention span and ability to concentrate, normal sensation and normal coordination.  Musculoskeletal Normal Exam - Bilateral-Upper Extremity Strength Normal and Lower Extremity Strength Normal.    Assessment & Plan  INCARCERATED UMBILICAL HERNIA (K42.0)  You have a small hernia in the midline above your umbilicus This is  causing you a lot of pain and I have told YOU we are willing to repair this with mesh YOUr CT scan shows a small hernia possibly with a little bit of fat caught in it but it has not changed in size since 2014 Because of your smoking and tobacco dependence, your risk of recurrence and wound complications is higher than patients who do not smoke. You ACCEPT this risk   Because you are under management of a pain clinic in [place], we wish to review the most recent records from that physician Once we get those records and I review them then we will schedule your surgery After surgery all of your pain management will be under the supervision of 1 Dr., your pain clinic management doctor  When we schedule surgery will be called laparoscopic repair of incarcerated ventral hernia with mesh, possible open repair Dr. 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 Derrell LollingIngram, M.D., [facility] General and Minimally invasive Surgery Breast and Colorectal Surgery Office:   [phone]

## 2019-03-12 ENCOUNTER — Encounter (HOSPITAL_COMMUNITY): Payer: Self-pay

## 2019-03-12 NOTE — Pre-Procedure Instructions (Signed)
[facility] #[id number] [place], [place] - [phone] AT [facility] OF [facility] & [address] [place] [address] Phone: [phone] Fax: [phone]      Your procedure is scheduled on 03-19-19  Report to [facility] Main Entrance "A" at 0530 A.M., and check in at the Admitting office.  Call this number if you have problems the morning of surgery:  [phone]  Call [phone] if you have any questions prior to your surgery date Monday-Friday 8am-4pm    Remember:  Do not eat or drink after midnight the night before your surgery   Take these medicines the morning of surgery with A SIP OF WATER : topiramate (TOPAMAX prednisoLONE omeprazole (PRILOSEC) methocarbamol (ROBAXIN) hydrOXYzine (VISTARIL) fexofenadine (ALLEGRA)  ezetimibe (ZETIA) cloNIDine (CATAPRES) albuterol (PROVENTIL HFA;VENTOLIN HFA)as needed,Bring your inhaler. HYDROmorphone (DILAUDID) as needed SUMAtriptan (IMITREX)as needed 7 days prior to surgery STOP taking any Aspirin (unless otherwise instructed by your surgeon), Aleve, Naproxen, Ibuprofen, Motrin, Advil, Goody's, BC's, all herbal medications, fish oil, and all vitamins.    The Morning of Surgery  Do not wear jewelry, make-up or nail polish.  Do not wear lotions, powders, or perfumes/colognes, or deodorant  Do not shave 48 hours prior to surgery.  Men may shave face and neck.  Do not bring valuables to the hospital.  [facility] is not responsible for any belongings or valuables.  If you are a smoker, DO NOT Smoke 24 hours prior to surgery IF you wear a CPAP at night please bring your mask, tubing, and machine the morning of surgery   Remember that you must have someone to transport you home after your surgery, and remain with you for 24 hours if you are discharged the same day.   Contacts, glasses, hearing aids, dentures or bridgework may not be worn into surgery.    Leave your suitcase in the car.  After  surgery it may be brought to your room.  For patients admitted to the hospital, discharge time will be determined by your treatment team.  Patients discharged the day of surgery will not be allowed to drive home.    Special instructions:   - Preparing For Surgery  Before surgery, you can play an important role. Because skin is not sterile, your skin needs to be as free of germs as possible. You can reduce the number of germs on your skin by washing with CHG (chlorahexidine gluconate) Soap before surgery.  CHG is an antiseptic cleaner which kills germs and bonds with the skin to continue killing germs even after washing.    Oral Hygiene is also important to reduce your risk of infection.  Remember - BRUSH YOUR TEETH THE MORNING OF SURGERY WITH YOUR REGULAR TOOTHPASTE  Please do not use if you have an allergy to CHG or antibacterial soaps. If your skin becomes reddened/irritated stop using the CHG.  Do not shave (including legs and underarms) for at least 48 hours prior to first CHG shower. It is OK to shave your face.  Please follow these instructions carefully.   1. Shower the NIGHT BEFORE SURGERY and the MORNING OF SURGERY with CHG Soap.   2. If you chose to wash your hair, wash your hair first as usual with your normal shampoo.  3. After you shampoo, rinse your hair and body thoroughly to remove the shampoo.  4. Use CHG as you would any other liquid soap. You can apply CHG directly to the  skin and wash gently with a scrungie or a clean washcloth.   5. Apply the CHG Soap to your body ONLY FROM THE NECK DOWN.  Do not use on open wounds or open sores. Avoid contact with your eyes, ears, mouth and genitals (private parts). Wash Face and genitals (private parts)  with your normal soap.   6. Wash thoroughly, paying special attention to the area where your surgery will be performed.  7. Thoroughly rinse your body with warm water from the neck down.  8. DO NOT shower/wash with  your normal soap after using and rinsing off the CHG Soap.  9. Pat yourself dry with a CLEAN TOWEL.  10. Wear CLEAN PAJAMAS to bed the night before surgery, wear comfortable clothes the morning of surgery  11. Place CLEAN SHEETS on your bed the night of your first shower and DO NOT SLEEP WITH PETS.    Day of Surgery:  Do not apply any deodorants/lotions. Please shower the morning of surgery with the CHG soap  Please wear clean clothes to the hospital/surgery center.   Remember to brush your teeth WITH YOUR REGULAR TOOTHPASTE.   Please read over the following fact sheets that you were given.

## 2019-03-15 ENCOUNTER — Inpatient Hospital Stay (HOSPITAL_COMMUNITY)
Admission: RE | Admit: 2019-03-15 | Discharge: 2019-03-15 | Disposition: A | Discharge status: Home / Self Care | Payer: 59 | Source: Ambulatory Visit

## 2019-03-15 NOTE — Pre-Procedure Instructions (Signed)
[facility] STORE [place], [place] [address] Phone: [phone] Fax: [phone]    Your procedure is scheduled on 03-19-19  Report to [facility] Main Entrance "A" at 0530 A.M., and check in at the Admitting office.  Call this number if you have problems the morning of surgery:  [phone]  Call [phone] if you have any questions prior to your surgery date Monday-Friday 8am-4pm   Remember:  Do not eat or drink after midnight the night before your surgery   Take these medicines the morning of surgery with A SIP OF WATER : topiramate (TOPAMAX prednisoLONE omeprazole (PRILOSEC) methocarbamol (ROBAXIN) hydrOXYzine (VISTARIL) fexofenadine (ALLEGRA)  estradiol (ESTRACE)  ezetimibe (ZETIA) cloNIDine (CATAPRES)  If needed - albuterol (PROVENTIL HFA;VENTOLIN HFA)/inhaler - bring with you the morning of surgery HYDROmorphone (DILAUDID), SUMAtriptan (IMITREX)  As of today, STOP taking any Aspirin (unless otherwise instructed by your surgeon), Aleve, Naproxen, Ibuprofen, Motrin, Advil, Goody's, BC's, all herbal medications, fish oil, and all vitamins.   The Morning of Surgery  Do not wear jewelry, make-up or nail polish.  Do not wear lotions, powders, perfumes, or deodorant  Do not shave 48 hours prior to surgery.    Do not bring valuables to the hospital.  [facility] is not responsible for any belongings or valuables.  If you are a smoker, DO NOT Smoke 24 hours prior to surgery IF you wear a CPAP at night please bring your mask, tubing, and machine the morning of surgery   Remember that you must have someone to transport you home after your surgery, and remain with you for 24 hours if you are discharged the same day.  Contacts, glasses, hearing aids, dentures or bridgework may not be worn into surgery.   Leave your suitcase in the car.  After surgery it may be brought  to your room.  For patients admitted to the hospital, discharge time will be determined by your treatment team.  Patients discharged the day of surgery will not be allowed to drive home.   Special instructions:   [place]- Preparing For Surgery  Before surgery, you can play an important role. Because skin is not sterile, your skin needs to be as free of germs as possible. You can reduce the number of germs on your skin by washing with CHG (chlorahexidine gluconate) Soap before surgery.  CHG is an antiseptic cleaner which kills germs and bonds with the skin to continue killing germs even after washing.    Oral Hygiene is also important to reduce your risk of infection.  Remember - BRUSH YOUR TEETH THE MORNING OF SURGERY WITH YOUR REGULAR TOOTHPASTE  Please do not use if you have an allergy to CHG or antibacterial soaps. If your skin becomes reddened/irritated stop using the CHG.  Do not shave (including legs and underarms) for at least 48 hours prior to first CHG shower. It is OK to shave your face.  Please follow these instructions carefully.   1. Shower the NIGHT BEFORE SURGERY and the MORNING OF SURGERY with CHG Soap.   2. If you chose to wash your hair, wash your hair first as usual with your normal shampoo.  3. After you shampoo, rinse your hair and body thoroughly to remove the shampoo.  4. Use CHG as you would any other liquid soap. You can apply CHG directly to the skin and wash gently with a  scrungie or a clean washcloth.   5. Apply the CHG Soap to your body ONLY FROM THE NECK DOWN.  Do not use on open wounds or open sores. Avoid contact with your eyes, ears, mouth and genitals (private parts). Wash Face and genitals (private parts)  with your normal soap.   6. Wash thoroughly, paying special attention to the area where your surgery will be performed.  7. Thoroughly rinse your body with warm water from the neck down.  8. DO NOT shower/wash with your normal soap after using  and rinsing off the CHG Soap.  9. Pat yourself dry with a CLEAN TOWEL.  10. Wear CLEAN PAJAMAS to bed the night before surgery, wear comfortable clothes the morning of surgery  11. Place CLEAN SHEETS on your bed the night of your first shower and DO NOT SLEEP WITH PETS.  Day of Surgery: Do not apply any deodorants/lotions. Please shower the morning of surgery with the CHG soap  Please wear clean clothes to the hospital/surgery center.   Remember to brush your teeth WITH YOUR REGULAR TOOTHPASTE.  Please read over the following fact sheets that you were given.

## 2019-03-16 ENCOUNTER — Other Ambulatory Visit (HOSPITAL_COMMUNITY)
Admission: RE | Admit: 2019-03-16 | Discharge: 2019-03-16 | Disposition: A | Discharge status: Home / Self Care | Payer: 59 | Source: Ambulatory Visit | Attending: General Surgery | Admitting: General Surgery

## 2019-03-16 DIAGNOSIS — Z01812 Encounter for preprocedural laboratory examination: Secondary | ICD-10-CM | POA: Insufficient documentation

## 2019-03-16 DIAGNOSIS — Z20828 Contact with and (suspected) exposure to other viral communicable diseases: Secondary | ICD-10-CM | POA: Insufficient documentation

## 2019-03-17 ENCOUNTER — Encounter (HOSPITAL_COMMUNITY): Payer: Self-pay

## 2019-03-17 ENCOUNTER — Other Ambulatory Visit: Payer: Self-pay

## 2019-03-17 ENCOUNTER — Encounter (HOSPITAL_COMMUNITY)
Admission: RE | Admit: 2019-03-17 | Discharge: 2019-03-17 | Disposition: A | Discharge status: Home / Self Care | Payer: 59 | Source: Ambulatory Visit | Attending: General Surgery | Admitting: General Surgery

## 2019-03-17 DIAGNOSIS — I10 Essential (primary) hypertension: Secondary | ICD-10-CM | POA: Insufficient documentation

## 2019-03-17 DIAGNOSIS — Z01818 Encounter for other preprocedural examination: Secondary | ICD-10-CM | POA: Diagnosis not present

## 2019-03-17 HISTORY — DX: Palpitations: R00.2

## 2019-03-17 HISTORY — DX: Cardiac murmur, unspecified: R01.1

## 2019-03-17 HISTORY — DX: Pneumonia, unspecified organism: J18.9

## 2019-03-17 HISTORY — DX: Unspecified asthma, uncomplicated: J45.909

## 2019-03-17 LAB — CBC WITH DIFFERENTIAL/PLATELET
Abs Immature Granulocytes: 0.02 10*3/uL (ref 0.00–0.07)
Basophils Absolute: 0.1 10*3/uL (ref 0.0–0.1)
Basophils Relative: 1 %
Eosinophils Absolute: 0.1 10*3/uL (ref 0.0–0.5)
Eosinophils Relative: 1 %
HCT: 33.4 % — ABNORMAL LOW (ref 36.0–46.0)
Hemoglobin: 10.3 g/dL — ABNORMAL LOW (ref 12.0–15.0)
Immature Granulocytes: 0 %
Lymphocytes Relative: 24 %
Lymphs Abs: 2.1 10*3/uL (ref 0.7–4.0)
MCH: 25.9 pg — ABNORMAL LOW (ref 26.0–34.0)
MCHC: 30.8 g/dL (ref 30.0–36.0)
MCV: 83.9 fL (ref 80.0–100.0)
Monocytes Absolute: 0.6 10*3/uL (ref 0.1–1.0)
Monocytes Relative: 7 %
Neutro Abs: 5.7 10*3/uL (ref 1.7–7.7)
Neutrophils Relative %: 67 %
Platelets: 370 10*3/uL (ref 150–400)
RBC: 3.98 MIL/uL (ref 3.87–5.11)
RDW: 16.4 % — ABNORMAL HIGH (ref 11.5–15.5)
WBC: 8.6 10*3/uL (ref 4.0–10.5)
nRBC: 0 % (ref 0.0–0.2)

## 2019-03-17 LAB — COMPREHENSIVE METABOLIC PANEL
ALT: 18 U/L (ref 0–44)
AST: 22 U/L (ref 15–41)
Albumin: 3.5 g/dL (ref 3.5–5.0)
Alkaline Phosphatase: 33 U/L — ABNORMAL LOW (ref 38–126)
Anion gap: 7 (ref 5–15)
BUN: 18 mg/dL (ref 6–20)
CO2: 23 mmol/L (ref 22–32)
Calcium: 8.8 mg/dL — ABNORMAL LOW (ref 8.9–10.3)
Chloride: 109 mmol/L (ref 98–111)
Creatinine, Ser: 1.24 mg/dL — ABNORMAL HIGH (ref 0.44–1.00)
GFR calc Af Amer: 59 mL/min — ABNORMAL LOW (ref >60)
GFR calc non Af Amer: 51 mL/min — ABNORMAL LOW (ref >60)
Glucose, Bld: 129 mg/dL — ABNORMAL HIGH (ref 70–99)
Potassium: 3.9 mmol/L (ref 3.5–5.1)
Sodium: 139 mmol/L (ref 135–145)
Total Bilirubin: 0.4 mg/dL (ref 0.3–1.2)
Total Protein: 6.3 g/dL — ABNORMAL LOW (ref 6.5–8.1)

## 2019-03-17 LAB — NOVEL CORONAVIRUS, NAA (HOSP ORDER, SEND-OUT TO REF LAB; TAT 18-24 HRS): SARS-CoV-2, NAA: NOT DETECTED

## 2019-03-17 LAB — SURGICAL PCR SCREEN
MRSA, PCR: NEGATIVE
Staphylococcus aureus: POSITIVE — AB

## 2019-03-17 NOTE — Progress Notes (Signed)
Attempted to contact patient regarding positive MSSA on PCR.  Patient did not answer either number listed and vm was full.  Prescription called into [place] [facility].

## 2019-03-17 NOTE — Progress Notes (Addendum)
PCP - [facility]) Cardiologist - denies Neurologist- Dr. Berdine Addison Pain Management Specialist- Dr. Sanjuan Dame GI Specialist- Dr. Fuller Plan   PPM/ICD - denies Device Orders - N/A Rep Notified - N/A  Chest x-ray - N/A EKG - 03/17/2019 Stress Test - deines ECHO - denies Cardiac Cath - denies  Sleep Study - denies CPAP - N/A  Fasting Blood Sugar - N/A   Blood Thinner Instructions: N/A Aspirin Instructions: N/A  ERAS Protcol - N/A PRE-SURGERY Ensure or G2- N/A  COVID TEST- 03/16/2019   Anesthesia review: No. Clarified with Jeneen Rinks, PA-C about Anesthesia review since patient states she has a childhood hx of heart murmur but never had an echocardiogram, nor had to follow up.   Patient denies shortness of breath, fever, cough and chest pain at PAT appointment   Coronavirus Screening  Have you experienced the following symptoms:  Cough yes/no: No Fever (>100.28F)  yes/no: No Runny nose yes/no: No Sore throat yes/no: No Difficulty breathing/shortness of breath  yes/no: No  Have you or a family member traveled in the last 14 days and where? yes/no: No   If the patient indicates "YES" to the above questions, their PAT will be rescheduled to limit the exposure to others and, the surgeon will be notified. THE PATIENT WILL NEED TO BE ASYMPTOMATIC FOR 14 DAYS.   If the patient is not experiencing any of these symptoms, the PAT nurse will instruct them to NOT bring anyone with them to their appointment since they may have these symptoms or traveled as well.   Please remind your patients and families that hospital visitation restrictions are in effect and the importance of the restrictions.     All instructions explained to the patient, with a verbal understanding of the material. Patient agrees to go over the instructions while at home for a better understanding. Patient also instructed to self quarantine after being tested for COVID-19. The opportunity to ask  questions was provided.

## 2019-03-19 ENCOUNTER — Other Ambulatory Visit: Payer: Self-pay

## 2019-03-19 ENCOUNTER — Encounter (HOSPITAL_COMMUNITY): Payer: Self-pay | Admitting: Certified Registered"

## 2019-03-19 ENCOUNTER — Ambulatory Visit (HOSPITAL_COMMUNITY): Payer: 59 | Admitting: Physician Assistant

## 2019-03-19 ENCOUNTER — Encounter (HOSPITAL_COMMUNITY)
Admission: RE | Disposition: A | Discharge status: Home / Self Care | Payer: Self-pay | Source: Home / Self Care | Attending: General Surgery

## 2019-03-19 ENCOUNTER — Ambulatory Visit (HOSPITAL_COMMUNITY): Payer: 59 | Admitting: Certified Registered"

## 2019-03-19 ENCOUNTER — Observation Stay (HOSPITAL_COMMUNITY)
Admission: RE | Admit: 2019-03-19 | Discharge: 2019-03-20 | Disposition: A | Discharge status: Home / Self Care | Payer: 59 | Attending: General Surgery | Admitting: General Surgery

## 2019-03-19 DIAGNOSIS — Z90710 Acquired absence of both cervix and uterus: Secondary | ICD-10-CM | POA: Diagnosis not present

## 2019-03-19 DIAGNOSIS — M549 Dorsalgia, unspecified: Secondary | ICD-10-CM | POA: Insufficient documentation

## 2019-03-19 DIAGNOSIS — Z79891 Long term (current) use of opiate analgesic: Secondary | ICD-10-CM | POA: Diagnosis not present

## 2019-03-19 DIAGNOSIS — Z90722 Acquired absence of ovaries, bilateral: Secondary | ICD-10-CM | POA: Insufficient documentation

## 2019-03-19 DIAGNOSIS — F3162 Bipolar disorder, current episode mixed, moderate: Secondary | ICD-10-CM | POA: Insufficient documentation

## 2019-03-19 DIAGNOSIS — K439 Ventral hernia without obstruction or gangrene: Secondary | ICD-10-CM | POA: Diagnosis present

## 2019-03-19 DIAGNOSIS — E7800 Pure hypercholesterolemia, unspecified: Secondary | ICD-10-CM | POA: Diagnosis not present

## 2019-03-19 DIAGNOSIS — G43909 Migraine, unspecified, not intractable, without status migrainosus: Secondary | ICD-10-CM | POA: Diagnosis not present

## 2019-03-19 DIAGNOSIS — F988 Other specified behavioral and emotional disorders with onset usually occurring in childhood and adolescence: Secondary | ICD-10-CM | POA: Insufficient documentation

## 2019-03-19 DIAGNOSIS — K436 Other and unspecified ventral hernia with obstruction, without gangrene: Secondary | ICD-10-CM | POA: Diagnosis present

## 2019-03-19 DIAGNOSIS — M797 Fibromyalgia: Secondary | ICD-10-CM | POA: Diagnosis present

## 2019-03-19 DIAGNOSIS — Z9079 Acquired absence of other genital organ(s): Secondary | ICD-10-CM | POA: Insufficient documentation

## 2019-03-19 DIAGNOSIS — Z79890 Hormone replacement therapy (postmenopausal): Secondary | ICD-10-CM | POA: Diagnosis not present

## 2019-03-19 DIAGNOSIS — Z79899 Other long term (current) drug therapy: Secondary | ICD-10-CM | POA: Insufficient documentation

## 2019-03-19 DIAGNOSIS — J449 Chronic obstructive pulmonary disease, unspecified: Secondary | ICD-10-CM | POA: Insufficient documentation

## 2019-03-19 DIAGNOSIS — G8929 Other chronic pain: Secondary | ICD-10-CM | POA: Insufficient documentation

## 2019-03-19 DIAGNOSIS — F17210 Nicotine dependence, cigarettes, uncomplicated: Secondary | ICD-10-CM | POA: Diagnosis not present

## 2019-03-19 DIAGNOSIS — F419 Anxiety disorder, unspecified: Secondary | ICD-10-CM | POA: Insufficient documentation

## 2019-03-19 DIAGNOSIS — J45909 Unspecified asthma, uncomplicated: Secondary | ICD-10-CM | POA: Diagnosis present

## 2019-03-19 HISTORY — PX: VENTRAL HERNIA REPAIR: SHX424

## 2019-03-19 LAB — CBC
HCT: 31.1 % — ABNORMAL LOW (ref 36.0–46.0)
Hemoglobin: 10 g/dL — ABNORMAL LOW (ref 12.0–15.0)
MCH: 26.7 pg (ref 26.0–34.0)
MCHC: 32.2 g/dL (ref 30.0–36.0)
MCV: 83.2 fL (ref 80.0–100.0)
Platelets: 334 10*3/uL (ref 150–400)
RBC: 3.74 MIL/uL — ABNORMAL LOW (ref 3.87–5.11)
RDW: 16.5 % — ABNORMAL HIGH (ref 11.5–15.5)
WBC: 8.4 10*3/uL (ref 4.0–10.5)
nRBC: 0 % (ref 0.0–0.2)

## 2019-03-19 LAB — CREATININE, SERUM
Creatinine, Ser: 1.12 mg/dL — ABNORMAL HIGH (ref 0.44–1.00)
GFR calc Af Amer: >60 mL/min (ref >60)
GFR calc non Af Amer: 57 mL/min — ABNORMAL LOW (ref >60)

## 2019-03-19 SURGERY — REPAIR, HERNIA, VENTRAL, LAPAROSCOPIC
Anesthesia: General | Site: Abdomen

## 2019-03-19 MED ORDER — ALPRAZOLAM 0.5 MG PO TABS: 1.0000 mg | ORAL_TABLET | Freq: Two times a day (BID) | ORAL | Status: DC | PRN

## 2019-03-19 MED ORDER — ENOXAPARIN SODIUM 40 MG/0.4ML ~~LOC~~ SOLN
40.0000 mg | SUBCUTANEOUS | Status: DC
  Administered 2019-03-20: 40 mg via SUBCUTANEOUS
  Filled 2019-03-19: qty 0.4

## 2019-03-19 MED ORDER — TRAMADOL HCL 50 MG PO TABS: 50.0000 mg | ORAL_TABLET | Freq: Four times a day (QID) | ORAL | Status: DC | PRN

## 2019-03-19 MED ORDER — SODIUM CHLORIDE 0.9 % IR SOLN
Status: DC | PRN
  Administered 2019-03-19: 1000 mL

## 2019-03-19 MED ORDER — SENNA 8.6 MG PO TABS
1.0000 | ORAL_TABLET | Freq: Two times a day (BID) | ORAL | Status: DC
  Administered 2019-03-19 – 2019-03-20 (×2): 8.6 mg via ORAL
  Filled 2019-03-19 (×2): qty 1

## 2019-03-19 MED ORDER — STERILE WATER FOR IRRIGATION IR SOLN
Status: DC | PRN
  Administered 2019-03-19: 1000 mL

## 2019-03-19 MED ORDER — GABAPENTIN 300 MG PO CAPS: 300.0000 mg | ORAL_CAPSULE | ORAL | Status: AC

## 2019-03-19 MED ORDER — CHLORHEXIDINE GLUCONATE CLOTH 2 % EX PADS: 6.0000 | MEDICATED_PAD | Freq: Once | CUTANEOUS | Status: DC

## 2019-03-19 MED ORDER — CEFAZOLIN SODIUM-DEXTROSE 2-4 GM/100ML-% IV SOLN
2.0000 g | Freq: Three times a day (TID) | INTRAVENOUS | Status: AC
  Administered 2019-03-19: 2 g via INTRAVENOUS
  Filled 2019-03-19: qty 100

## 2019-03-19 MED ORDER — MIDAZOLAM HCL 5 MG/5ML IJ SOLN
INTRAMUSCULAR | Status: DC | PRN
  Administered 2019-03-19: 2 mg via INTRAVENOUS

## 2019-03-19 MED ORDER — BUPIVACAINE LIPOSOME 1.3 % IJ SUSP
20.0000 mL | Freq: Once | INTRAMUSCULAR | Status: DC
  Filled 2019-03-19: qty 20

## 2019-03-19 MED ORDER — ACETAMINOPHEN 500 MG PO TABS
1000.0000 mg | ORAL_TABLET | ORAL | Status: AC
  Administered 2019-03-19: 08:00:00 1000 mg via ORAL

## 2019-03-19 MED ORDER — PROPOFOL 10 MG/ML IV BOLUS
INTRAVENOUS | Status: AC
  Filled 2019-03-19: qty 20

## 2019-03-19 MED ORDER — ESTRADIOL 2 MG PO TABS
2.0000 mg | ORAL_TABLET | Freq: Every day | ORAL | Status: DC
  Administered 2019-03-19: 2 mg via ORAL
  Filled 2019-03-19 (×2): qty 1

## 2019-03-19 MED ORDER — SUMATRIPTAN SUCCINATE 100 MG PO TABS
100.0000 mg | ORAL_TABLET | ORAL | Status: DC | PRN
  Filled 2019-03-19: qty 1

## 2019-03-19 MED ORDER — B COMPLEX-C PO TABS
2.0000 | ORAL_TABLET | Freq: Every day | ORAL | Status: DC
  Administered 2019-03-20: 09:00:00 2 via ORAL
  Filled 2019-03-19: qty 2

## 2019-03-19 MED ORDER — ONDANSETRON 4 MG PO TBDP: 4.0000 mg | ORAL_TABLET | Freq: Four times a day (QID) | ORAL | Status: DC | PRN

## 2019-03-19 MED ORDER — HYDROMORPHONE HCL 1 MG/ML IJ SOLN
1.0000 mg | INTRAMUSCULAR | Status: DC | PRN
  Administered 2019-03-19 (×2): 1 mg via INTRAVENOUS
  Filled 2019-03-19 (×3): qty 1

## 2019-03-19 MED ORDER — BACID PO TABS
2.0000 | ORAL_TABLET | Freq: Two times a day (BID) | ORAL | Status: DC
  Administered 2019-03-19 – 2019-03-20 (×2): 2 via ORAL
  Filled 2019-03-19 (×3): qty 2

## 2019-03-19 MED ORDER — CEFAZOLIN SODIUM-DEXTROSE 2-4 GM/100ML-% IV SOLN
2.0000 g | INTRAVENOUS | Status: AC
  Administered 2019-03-19: 2 g via INTRAVENOUS

## 2019-03-19 MED ORDER — GABAPENTIN 300 MG PO CAPS
ORAL_CAPSULE | ORAL | Status: AC
  Filled 2019-03-19: qty 1

## 2019-03-19 MED ORDER — SUGAMMADEX SODIUM 200 MG/2ML IV SOLN
INTRAVENOUS | Status: DC | PRN
  Administered 2019-03-19: 160 mg via INTRAVENOUS

## 2019-03-19 MED ORDER — SODIUM CHLORIDE 0.9 % IV SOLN
INTRAVENOUS | Status: DC | PRN
  Administered 2019-03-19: 40 mL

## 2019-03-19 MED ORDER — HYDROXYZINE HCL 25 MG PO TABS
25.0000 mg | ORAL_TABLET | Freq: Two times a day (BID) | ORAL | Status: DC
  Administered 2019-03-19: 25 mg via ORAL
  Filled 2019-03-19 (×2): qty 1

## 2019-03-19 MED ORDER — KETAMINE HCL 10 MG/ML IJ SOLN
INTRAMUSCULAR | Status: DC | PRN
  Administered 2019-03-19 (×2): 10 mg via INTRAVENOUS

## 2019-03-19 MED ORDER — CELECOXIB 200 MG PO CAPS
200.0000 mg | ORAL_CAPSULE | ORAL | Status: AC
  Administered 2019-03-19: 08:00:00 200 mg via ORAL

## 2019-03-19 MED ORDER — OXYCODONE HCL 5 MG PO TABS
5.0000 mg | ORAL_TABLET | ORAL | Status: DC | PRN
  Administered 2019-03-19 – 2019-03-20 (×3): 10 mg via ORAL
  Filled 2019-03-19 (×3): qty 2

## 2019-03-19 MED ORDER — METHADONE HCL 10 MG PO TABS
5.0000 mg | ORAL_TABLET | Freq: Three times a day (TID) | ORAL | Status: DC
  Administered 2019-03-19 – 2019-03-20 (×3): 5 mg via ORAL
  Filled 2019-03-19 (×3): qty 1

## 2019-03-19 MED ORDER — CEFAZOLIN SODIUM-DEXTROSE 2-4 GM/100ML-% IV SOLN
INTRAVENOUS | Status: AC
  Filled 2019-03-19: qty 100

## 2019-03-19 MED ORDER — MIDAZOLAM HCL 2 MG/2ML IJ SOLN
INTRAMUSCULAR | Status: AC
  Filled 2019-03-19: qty 2

## 2019-03-19 MED ORDER — ROCURONIUM BROMIDE 10 MG/ML (PF) SYRINGE
PREFILLED_SYRINGE | INTRAVENOUS | Status: DC | PRN
  Administered 2019-03-19: 60 mg via INTRAVENOUS

## 2019-03-19 MED ORDER — METHOCARBAMOL 500 MG PO TABS
500.0000 mg | ORAL_TABLET | Freq: Two times a day (BID) | ORAL | Status: DC
  Administered 2019-03-19: 500 mg via ORAL
  Filled 2019-03-19 (×2): qty 1

## 2019-03-19 MED ORDER — ALBUTEROL SULFATE (2.5 MG/3ML) 0.083% IN NEBU: 3.0000 mL | INHALATION_SOLUTION | Freq: Four times a day (QID) | RESPIRATORY_TRACT | Status: DC | PRN

## 2019-03-19 MED ORDER — LACTATED RINGERS IV SOLN
INTRAVENOUS | Status: DC
  Administered 2019-03-19: 08:00:00 via INTRAVENOUS

## 2019-03-19 MED ORDER — PANTOPRAZOLE SODIUM 40 MG PO TBEC
80.0000 mg | DELAYED_RELEASE_TABLET | Freq: Every day | ORAL | Status: DC
  Filled 2019-03-19: qty 2

## 2019-03-19 MED ORDER — HYDROXYZINE PAMOATE 25 MG PO CAPS: 25.0000 mg | ORAL_CAPSULE | Freq: Two times a day (BID) | ORAL | Status: DC

## 2019-03-19 MED ORDER — ONDANSETRON HCL 4 MG/2ML IJ SOLN: 4.0000 mg | Freq: Four times a day (QID) | INTRAMUSCULAR | Status: DC | PRN

## 2019-03-19 MED ORDER — ACETAMINOPHEN 500 MG PO TABS
ORAL_TABLET | ORAL | Status: AC
  Administered 2019-03-19: 1000 mg via ORAL
  Filled 2019-03-19: qty 2

## 2019-03-19 MED ORDER — PROPOFOL 10 MG/ML IV BOLUS
INTRAVENOUS | Status: DC | PRN
  Administered 2019-03-19: 150 mg via INTRAVENOUS

## 2019-03-19 MED ORDER — PREDNISOLONE 5 MG PO TABS
5.0000 mg | ORAL_TABLET | Freq: Every day | ORAL | Status: DC
  Administered 2019-03-19 – 2019-03-20 (×2): 5 mg via ORAL
  Filled 2019-03-19 (×2): qty 1

## 2019-03-19 MED ORDER — FENTANYL CITRATE (PF) 100 MCG/2ML IJ SOLN
INTRAMUSCULAR | Status: DC | PRN
  Administered 2019-03-19: 100 ug via INTRAVENOUS
  Administered 2019-03-19: 50 ug via INTRAVENOUS

## 2019-03-19 MED ORDER — KETAMINE HCL 50 MG/5ML IJ SOSY
PREFILLED_SYRINGE | INTRAMUSCULAR | Status: AC
  Filled 2019-03-19: qty 5

## 2019-03-19 MED ORDER — LIDOCAINE 2% (20 MG/ML) 5 ML SYRINGE
INTRAMUSCULAR | Status: DC | PRN
  Administered 2019-03-19: 100 mg via INTRAVENOUS

## 2019-03-19 MED ORDER — FENTANYL CITRATE (PF) 100 MCG/2ML IJ SOLN
INTRAMUSCULAR | Status: AC
  Filled 2019-03-19: qty 2

## 2019-03-19 MED ORDER — BUPIVACAINE-EPINEPHRINE 0.5% -1:200000 IJ SOLN
INTRAMUSCULAR | Status: AC
  Filled 2019-03-19: qty 1

## 2019-03-19 MED ORDER — FENTANYL CITRATE (PF) 250 MCG/5ML IJ SOLN
INTRAMUSCULAR | Status: AC
  Filled 2019-03-19: qty 5

## 2019-03-19 MED ORDER — BUPIVACAINE-EPINEPHRINE 0.25% -1:200000 IJ SOLN
INTRAMUSCULAR | Status: DC | PRN
  Administered 2019-03-19: .00001 mL

## 2019-03-19 MED ORDER — FENTANYL CITRATE (PF) 100 MCG/2ML IJ SOLN
25.0000 ug | INTRAMUSCULAR | Status: AC | PRN
  Administered 2019-03-19 (×3): 50 ug via INTRAVENOUS

## 2019-03-19 MED ORDER — VITAMIN D 25 MCG (1000 UNIT) PO TABS
1000.0000 [IU] | ORAL_TABLET | Freq: Every day | ORAL | Status: DC
  Filled 2019-03-19: qty 1

## 2019-03-19 MED ORDER — TOPIRAMATE 100 MG PO TABS
100.0000 mg | ORAL_TABLET | Freq: Every day | ORAL | Status: DC
  Administered 2019-03-20: 09:00:00 100 mg via ORAL
  Filled 2019-03-19: qty 1

## 2019-03-19 MED ORDER — ADULT MULTIVITAMIN W/MINERALS CH
1.0000 | ORAL_TABLET | Freq: Every day | ORAL | Status: DC
  Filled 2019-03-19: qty 1

## 2019-03-19 MED ORDER — ONDANSETRON HCL 4 MG/2ML IJ SOLN
INTRAMUSCULAR | Status: DC | PRN
  Administered 2019-03-19: 4 mg via INTRAVENOUS

## 2019-03-19 MED ORDER — FENTANYL CITRATE (PF) 100 MCG/2ML IJ SOLN
25.0000 ug | INTRAMUSCULAR | Status: DC | PRN
  Administered 2019-03-19 (×3): 50 ug via INTRAVENOUS

## 2019-03-19 MED ORDER — CLONIDINE HCL 0.1 MG PO TABS
0.1000 mg | ORAL_TABLET | Freq: Four times a day (QID) | ORAL | Status: DC
  Administered 2019-03-19 (×3): 0.1 mg via ORAL
  Filled 2019-03-19 (×4): qty 1

## 2019-03-19 MED ORDER — EZETIMIBE 10 MG PO TABS
10.0000 mg | ORAL_TABLET | Freq: Every day | ORAL | Status: DC
  Administered 2019-03-19: 15:00:00 10 mg via ORAL
  Filled 2019-03-19 (×2): qty 1

## 2019-03-19 MED ORDER — LACTATED RINGERS IV SOLN
INTRAVENOUS | Status: DC
  Administered 2019-03-19 – 2019-03-20 (×2): via INTRAVENOUS

## 2019-03-19 MED ORDER — DEXMEDETOMIDINE HCL IN NACL 80 MCG/20ML IV SOLN
INTRAVENOUS | Status: AC
  Filled 2019-03-19: qty 20

## 2019-03-19 MED ORDER — MEPERIDINE HCL 25 MG/ML IJ SOLN: 6.2500 mg | INTRAMUSCULAR | Status: DC | PRN

## 2019-03-19 MED ORDER — LORATADINE 10 MG PO TABS
10.0000 mg | ORAL_TABLET | Freq: Every day | ORAL | Status: DC
  Administered 2019-03-19: 10 mg via ORAL
  Filled 2019-03-19 (×2): qty 1

## 2019-03-19 MED ORDER — PROMETHAZINE HCL 25 MG/ML IJ SOLN: 6.2500 mg | INTRAMUSCULAR | Status: DC | PRN

## 2019-03-19 MED ORDER — CELECOXIB 200 MG PO CAPS
ORAL_CAPSULE | ORAL | Status: AC
  Administered 2019-03-19: 08:00:00 200 mg via ORAL
  Filled 2019-03-19: qty 1

## 2019-03-19 MED ORDER — DEXAMETHASONE SODIUM PHOSPHATE 10 MG/ML IJ SOLN
INTRAMUSCULAR | Status: DC | PRN
  Administered 2019-03-19: 10 mg via INTRAVENOUS

## 2019-03-19 SURGICAL SUPPLY — 52 items
ADH SKN CLS APL DERMABOND .7 (GAUZE/BANDAGES/DRESSINGS) ×1
APL PRP STRL LF DISP 70% ISPRP (MISCELLANEOUS)
BINDER ABDOMINAL 12 ML 46-62 (SOFTGOODS) ×4 IMPLANT
BLADE CLIPPER SURG (BLADE) IMPLANT
CANISTER SUCT 3000ML PPV (MISCELLANEOUS) IMPLANT
CHLORAPREP W/TINT 26 (MISCELLANEOUS) ×1 IMPLANT
COVER SURGICAL LIGHT HANDLE (MISCELLANEOUS) ×3 IMPLANT
COVER WAND RF STERILE (DRAPES) ×1 IMPLANT
DECANTER SPIKE VIAL GLASS SM (MISCELLANEOUS) ×3 IMPLANT
DERMABOND ADVANCED (GAUZE/BANDAGES/DRESSINGS) ×2
DERMABOND ADVANCED .7 DNX12 (GAUZE/BANDAGES/DRESSINGS) ×1 IMPLANT
DEVICE SECURE STRAP 25 ABSORB (INSTRUMENTS) ×5 IMPLANT
DEVICE TROCAR PUNCTURE CLOSURE (ENDOMECHANICALS) ×3 IMPLANT
DRSG PAD ABDOMINAL 8X10 ST (GAUZE/BANDAGES/DRESSINGS) IMPLANT
DURAPREP 26ML APPLICATOR (WOUND CARE) ×2 IMPLANT
ELECT REM PT RETURN 9FT ADLT (ELECTROSURGICAL) ×3
ELECTRODE REM PT RTRN 9FT ADLT (ELECTROSURGICAL) ×1 IMPLANT
GLOVE BIOGEL PI IND STRL 7.0 (GLOVE) IMPLANT
GLOVE BIOGEL PI INDICATOR 7.0 (GLOVE) ×4
GLOVE NEODERM STER SZ 7 (GLOVE) ×2 IMPLANT
GLOVE SS BIOGEL STRL SZ 7 (GLOVE) ×1 IMPLANT
GLOVE SUPERSENSE BIOGEL SZ 7 (GLOVE) ×2
GOWN STRL REUS W/ TWL LRG LVL3 (GOWN DISPOSABLE) ×2 IMPLANT
GOWN STRL REUS W/ TWL XL LVL3 (GOWN DISPOSABLE) ×1 IMPLANT
GOWN STRL REUS W/TWL LRG LVL3 (GOWN DISPOSABLE) ×6
GOWN STRL REUS W/TWL XL LVL3 (GOWN DISPOSABLE) ×3
KIT BASIN OR (CUSTOM PROCEDURE TRAY) ×3 IMPLANT
KIT TURNOVER KIT B (KITS) ×3 IMPLANT
MARKER SKIN DUAL TIP RULER LAB (MISCELLANEOUS) ×3 IMPLANT
MESH VENTRALIGHT ST 6IN CRC (Mesh General) ×2 IMPLANT
NDL INSUFFLATION 14GA 120MM (NEEDLE) IMPLANT
NDL SPNL 22GX3.5 QUINCKE BK (NEEDLE) ×1 IMPLANT
NEEDLE INSUFFLATION 14GA 120MM (NEEDLE) ×3 IMPLANT
NEEDLE SPNL 22GX3.5 QUINCKE BK (NEEDLE) ×3 IMPLANT
NS IRRIG 1000ML POUR BTL (IV SOLUTION) ×3 IMPLANT
PAD ARMBOARD 7.5X6 YLW CONV (MISCELLANEOUS) ×6 IMPLANT
SCISSORS LAP 5X35 DISP (ENDOMECHANICALS) IMPLANT
SET IRRIG TUBING LAPAROSCOPIC (IRRIGATION / IRRIGATOR) IMPLANT
SET TUBE SMOKE EVAC HIGH FLOW (TUBING) ×3 IMPLANT
SHEARS HARMONIC ACE PLUS 36CM (ENDOMECHANICALS) ×2 IMPLANT
SLEEVE ENDOPATH XCEL 5M (ENDOMECHANICALS) ×6 IMPLANT
SUT MNCRL AB 4-0 PS2 18 (SUTURE) ×3 IMPLANT
SUT NOVA NAB DX-16 0-1 5-0 T12 (SUTURE) ×5 IMPLANT
TOWEL GREEN STERILE (TOWEL DISPOSABLE) IMPLANT
TOWEL GREEN STERILE FF (TOWEL DISPOSABLE) ×3 IMPLANT
TRAY FOLEY MTR SLVR 14FR STAT (SET/KITS/TRAYS/PACK) IMPLANT
TRAY LAPAROSCOPIC MC (CUSTOM PROCEDURE TRAY) ×3 IMPLANT
TROCAR ADV FIXATION 5X100MM (TROCAR) ×2 IMPLANT
TROCAR BLADELESS 11MM (ENDOMECHANICALS) ×2 IMPLANT
TROCAR XCEL NON-BLD 11X100MML (ENDOMECHANICALS) IMPLANT
TROCAR XCEL NON-BLD 5MMX100MML (ENDOMECHANICALS) ×3 IMPLANT
WATER STERILE IRR 1000ML POUR (IV SOLUTION) ×3 IMPLANT

## 2019-03-19 NOTE — Anesthesia Preprocedure Evaluation (Signed)
Anesthesia Evaluation  Patient identified by MRN, date of birth, ID band Patient awake    Reviewed: Allergy & Precautions, H&P , NPO status , Patient's Chart, lab work & pertinent test results  Airway Mallampati: II   Neck ROM: full    Dental  (+) Missing   Pulmonary asthma , Current Smoker and Patient abstained from smoking., former smoker,    breath sounds clear to auscultation       Cardiovascular hypertension, negative cardio ROS   Rhythm:regular Rate:Normal     Neuro/Psych  Headaches, PSYCHIATRIC DISORDERS Anxiety Depression Bipolar Disorder    GI/Hepatic GERD  Medicated,  Endo/Other    Renal/GU      Musculoskeletal  (+) Arthritis , Fibromyalgia -  Abdominal Normal abdominal exam  (+)   Peds  Hematology  (+) anemia ,   Anesthesia Other Findings   Reproductive/Obstetrics                             Anesthesia Physical  Anesthesia Plan  ASA: II  Anesthesia Plan: General   Post-op Pain Management:    Induction: Intravenous  PONV Risk Score and Plan:   Airway Management Planned: Oral ETT  Additional Equipment: None  Intra-op Plan:   Post-operative Plan: Extubation in OR  Informed Consent: I have reviewed the patients History and Physical, chart, labs and discussed the procedure including the risks, benefits and alternatives for the proposed anesthesia with the patient or authorized representative who has indicated his/her understanding and acceptance.       Plan Discussed with: CRNA  Anesthesia Plan Comments:         Anesthesia Quick Evaluation

## 2019-03-19 NOTE — Anesthesia Postprocedure Evaluation (Signed)
Anesthesia Post Note  Patient: Connie Murray  Procedure(s) Performed: LAPAROSCOPIC INCARCERATED [place] HERNIA REPAIR With MESH (N/A Abdomen)     Patient location during evaluation: PACU Anesthesia Type: General Level of consciousness: sedated Pain management: pain level controlled Vital Signs Assessment: post-procedure vital signs reviewed and stable Respiratory status: spontaneous breathing Cardiovascular status: stable Postop Assessment: no apparent nausea or vomiting Anesthetic complications: no    Last Vitals:  Vitals:   03/19/19 1000 03/19/19 1015  BP: (!) 90/50 (!) 94/59  Pulse: 62 (!) 59  Resp: 16 11  Temp:    SpO2: 97% 98%    Last Pain:  Vitals:   03/19/19 1000  TempSrc:   PainSc: Asleep   Pain Goal: Patients Stated Pain Goal: 5 (03/19/19 0737)                 Huston Foley

## 2019-03-19 NOTE — Transfer of Care (Signed)
Immediate Anesthesia Transfer of Care Note  Patient: Connie Murray  Procedure(s) Performed: LAPAROSCOPIC INCARCERATED [place] HERNIA REPAIR With MESH (N/A Abdomen)  Patient Location: PACU  Anesthesia Type:General  Level of Consciousness: drowsy  Airway & Oxygen Therapy: Patient Spontanous Breathing and Patient connected to nasal cannula oxygen  Post-op Assessment: Report given to RN and Post -op Vital signs reviewed and stable  Post vital signs: Reviewed and stable  Last Vitals:  Vitals Value Taken Time  BP 118/67 03/19/19 0916  Temp    Pulse 72 03/19/19 0926  Resp 14 03/19/19 0926  SpO2 92 % 03/19/19 0926  Vitals shown include unvalidated device data.  Last Pain:  Vitals:   03/19/19 0737  TempSrc:   PainSc: 0-No pain      Patients Stated Pain Goal: 5 (88/89/16 9450)  Complications: No apparent anesthesia complications

## 2019-03-19 NOTE — Interval H&P Note (Signed)
History and Physical Interval Note:  03/19/2019 6:57 AM  Connie Murray  has presented today for surgery, with the diagnosis of INCARCERATED EPIGRASTIC HERNIA.  The various methods of treatment have been discussed with the patient and family. After consideration of risks, benefits and other options for treatment, the patient has consented to  Procedure(s): [place], POSSIBLE OPEN (N/A) as a surgical intervention.  The patient's history has been reviewed, patient examined, no change in status, stable for surgery.  I have reviewed the patient's chart and labs.  Questions were answered to the patient's satisfaction.     [name]

## 2019-03-19 NOTE — Discharge Instructions (Signed)
Because of your chronic pain management problems, we have agreed that all postoperative pain medications will be delivered and prescribed to you by your pain management clinic Doctor    [facility] ______[facility] LAPAROSCOPIC SURGERY: POST OP INSTRUCTIONS Always review your discharge instruction sheet given to you by the facility where your surgery was performed. IF YOU HAVE DISABILITY OR FAMILY LEAVE FORMS, YOU MUST BRING THEM TO THE OFFICE FOR PROCESSING.   DO NOT GIVE THEM TO YOUR DOCTOR.  1. A prescription for pain medication may be given to you upon discharge.  Take your pain medication as prescribed, if needed.  If narcotic pain medicine is not needed, then you may take acetaminophen (Tylenol) or ibuprofen (Advil) as needed. 2. Take your usually prescribed medications unless otherwise directed. 3. If you need a refill on your pain medication, please contact your pharmacy.  They will contact our office to request authorization. Prescriptions will not be filled after 5pm or on week-ends. 4. You should follow a light diet the first few days after arrival home, such as soup and crackers, etc.  Be sure to include lots of fluids daily. 5. Most patients will experience some swelling and bruising in the area of the incisions.  Ice packs will help.  Swelling and bruising can take several days to resolve.  6. It is common to experience some constipation if taking pain medication after surgery.  Increasing fluid intake and taking a stool softener (such as Colace) will usually help or prevent this problem from occurring.  A mild laxative (Milk of Magnesia or Miralax) should be taken according to package instructions if there are no bowel movements after 48 hours. 7. Unless discharge instructions indicate otherwise, you may remove your bandages 24-48 hours after surgery, and you may shower at that time.  You may have steri-strips (small skin tapes) in place directly over the incision.  These  strips should be left on the skin for 7-10 days.  If your surgeon used skin glue on the incision, you may shower in 24 hours.  The glue will flake off over the next 2-3 weeks.  Any sutures or staples will be removed at the office during your follow-up visit. 8. ACTIVITIES:  You may resume regular (light) daily activities beginning the next day--such as daily self-care, walking, climbing stairs--gradually increasing activities as tolerated.  You may have sexual intercourse when it is comfortable.  Refrain from any heavy lifting or straining until approved by your doctor. a. You may drive when you are no longer taking prescription pain medication, you can comfortably wear a seatbelt, and you can safely maneuver your car and apply brakes. b. RETURN TO WORK:  __________________________________________________________ 9. You should see your doctor in the office for a follow-up appointment approximately 2-3 weeks after your surgery.  Make sure that you call for this appointment within a day or two after you arrive home to insure a convenient appointment time. 10. OTHER INSTRUCTIONS: ___No sports or lifting more than 15 pounds for 6 weeks _______________________________________________________________________________________________________________________ __________________________________________________________________________________________________________________________ WHEN TO CALL YOUR DOCTOR: 1. Fever over 101.0 2. Inability to urinate 3. Continued bleeding from incision. 4. Increased pain, redness, or drainage from the incision. 5. Increasing abdominal pain  The clinic staff is available to answer your questions during regular business hours.  Please dont hesitate to call and ask to speak to one of the nurses for clinical concerns.  If you have a medical emergency, go to the nearest emergency room or call 911.  A surgeon from [facility] is always on call at the hospital. [address], [place], [place]  [address] ? [address] [address], [place], [place]   [address] [phone] ? [phone] ? FAX [phone] Web site: [email or web address]              Managing Your Pain After Surgery Without Opioids    Thank you for participating in our program to help patients manage their pain after surgery without opioids. This is part of our effort to provide you with the best care possible, without exposing you or your family to the risk that opioids pose.  What pain can I expect after surgery? You can expect to have some pain after surgery. This is normal. The pain is typically worse the day after surgery, and quickly begins to get better. Many studies have found that many patients are able to manage their pain after surgery with Over-the-Counter (OTC) medications such as Tylenol and Motrin. If you have a condition that does not allow you to take Tylenol or Motrin, notify your surgical team.  How will I manage my pain? The best strategy for controlling your pain after surgery is around the clock pain control with Tylenol (acetaminophen) and Motrin (ibuprofen or Advil). Alternating these medications with each other allows you to maximize your pain control. In addition to Tylenol and Motrin, you can use heating pads or ice packs on your incisions to help reduce your pain.  How will I alternate your regular strength over-the-counter pain medication? You will take a dose of pain medication every three hours. ; Start by taking 650 mg of Tylenol (2 pills of 325 mg) ; 3 hours later take 600 mg of Motrin (3 pills of 200 mg) ; 3 hours after taking the Motrin take 650 mg of Tylenol ; 3 hours after that take 600 mg of Motrin.   - 1 -  See example - if your first dose of Tylenol is at 12:00 PM   12:00 PM Tylenol 650 mg (2 pills of 325 mg)  3:00 PM Motrin 600 mg (3 pills of 200 mg)  6:00 PM Tylenol 650 mg (2 pills of 325 mg)  9:00 PM Motrin  600 mg (3 pills of 200 mg)  Continue alternating every 3 hours   We recommend that you follow this schedule around-the-clock for at least 3 days after surgery, or until you feel that it is no longer needed. Use the table on the last page of this handout to keep track of the medications you are taking. Important: Do not take more than 3000mg  of Tylenol or 3200mg  of Motrin in a 24-hour period. Do not take ibuprofen/Motrin if you have a history of bleeding stomach ulcers, severe kidney disease, &/or actively taking a blood thinner  What if I still have pain? If you have pain that is not controlled with the over-the-counter pain medications (Tylenol and Motrin or Advil) you might have what we call breakthrough pain. You will receive a prescription for a small amount of an opioid pain medication such as Oxycodone, Tramadol, or Tylenol with Codeine. Use these opioid pills in the first 24 hours after surgery if you have breakthrough pain. Do not take more than 1 pill every 4-6 hours.  If you still have uncontrolled pain after using all opioid pills, don't hesitate to call our staff using the number provided. We will help make sure you are managing your pain in the best way possible, and if  necessary, we can provide a prescription for additional pain medication.   Day 1    Time  Name of Medication Number of pills taken  Amount of Acetaminophen  Pain Level   Comments  AM PM       AM PM       AM PM       AM PM       AM PM       AM PM       AM PM       AM PM       Total Daily amount of Acetaminophen Do not take more than  3,000 mg per day      Day 2    Time  Name of Medication Number of pills taken  Amount of Acetaminophen  Pain Level   Comments  AM PM       AM PM       AM PM       AM PM       AM PM       AM PM       AM PM       AM PM       Total Daily amount of Acetaminophen Do not take more than  3,000 mg per day      Day 3    Time  Name of Medication Number of  pills taken  Amount of Acetaminophen  Pain Level   Comments  AM PM       AM PM       AM PM       AM PM          AM PM       AM PM       AM PM       AM PM       Total Daily amount of Acetaminophen Do not take more than  3,000 mg per day      Day 4    Time  Name of Medication Number of pills taken  Amount of Acetaminophen  Pain Level   Comments  AM PM       AM PM       AM PM       AM PM       AM PM       AM PM       AM PM       AM PM       Total Daily amount of Acetaminophen Do not take more than  3,000 mg per day      Day 5    Time  Name of Medication Number of pills taken  Amount of Acetaminophen  Pain Level   Comments  AM PM       AM PM       AM PM       AM PM       AM PM       AM PM       AM PM       AM PM       Total Daily amount of Acetaminophen Do not take more than  3,000 mg per day       Day 6    Time  Name of Medication Number of pills taken  Amount of Acetaminophen  Pain Level  Comments  AM PM       AM PM  AM PM       AM PM       AM PM       AM PM       AM PM       AM PM       Total Daily amount of Acetaminophen Do not take more than  3,000 mg per day      Day 7    Time  Name of Medication Number of pills taken  Amount of Acetaminophen  Pain Level   Comments  AM PM       AM PM       AM PM       AM PM       AM PM       AM PM       AM PM       AM PM       Total Daily amount of Acetaminophen Do not take more than  3,000 mg per day        For additional information about how and where to safely dispose of unused opioid medications - [employer]  Disclaimer: This document contains information and/or instructional materials adapted from [facility] for the typical patient with your condition. It does not replace medical advice from your health care provider because your experience may differ from that of the typical patient. Talk to your health care provider if you have any  questions about this document, your condition or your treatment plan. Adapted from [place]

## 2019-03-19 NOTE — Op Note (Signed)
Patient Name:           Connie Murray   Date of Surgery:        03/19/2019  Pre op Diagnosis:      Incarcerated epigastric hernia  Post op Diagnosis:    Same  Procedure:                 Laparoscopic reduction and repair of epigastric hernia with 15 cm diameter ventrallex ST composite mesh  Type of repair -mesh  (choices - primary suture, mesh, or component)  Name of mesh -Ventralex ST  Size of mesh - Length 15 cm, Width 15 cm  Mesh overlap -12 cm  Placement of mesh -beneath fascia and into peritoneal cavity  (choices - beneath fascia and into peritoneal cavity, beneath fascia but external to peritoneal cavity, between the muscle and fascia, above or external to fascia)   Surgeon:                     Angelia Mould. Derrell Lolling, M.D., FACS  Assistant:                      OR staff  Operative Indications:    This is a 48 year old female who returns for elective repair of a painful incarcerated supraumbilical ventral hernia. Dr. Aldona Bar is her gynecologist. She is followed by a pain clinic in [facility] because of chronic pain management. She has seen Dr. Russella Dar for GI problems in the past. r She presented on January 01, 2019 study she had a painful umbilical hernia without GI disturbance. She's had upper and lower endoscopy in the past. She says she has sharp pain in her umbilicus. Unbearable and getting worse. No imaging studies were done so I got a CT scan which shows a small midline hernia above the umbilicus with some incarcerated fat. She wants to have this repaired. Past history significant for TAH and BSO. Daily tobacco use. Bipolar wand disorder. Fibromyalgia. Migraines. PTSD. Internal bowel syndrome. ADD. Takes methadone. Asthma. Esophageal stricture. Prior breast biopsy She'll be scheduled for laparoscopic repair of incarcerated epigastric hernia with mesh. Possible open repair I discussed the indications, details, techniques, and numerous risk of the surgery with her. She is  aware of the risk of bleeding, infection, conversion to open laparotomy, recurrence of the hernia, rare but devastating complication of injury to the intestine with reconstructive surgery. Cardiac pulmonary and thromboembolic problems she understands all these issues. All of her questions are answered. She agrees with this plan.  Operative Findings:       She had an incarcerated epigastric hernia.  Moderate amount of preperitoneal fat in the lower end of the falciform ligament seem to be incarcerated into the defect.  The defect was 2 cm or less.  The umbilicus looked normal but I chose to put a large piece of mesh and overlap both the epigastric hernia and the umbilicus.  There were a few omental adhesions in the pelvis from her C-section but no other hernias were noted.  Four-quadrant inspection at the beginning of the case and at the end of the case revealed no injury or bleeding.  Procedure in Detail:          Following the induction of general endotracheal anesthesia the patient's abdomen was prepped and draped in a sterile fashion.  Intravenous antibiotics were given.  Surgical timeout was performed.       During and at the end of the case I used  a 50% solution of Exparel rel to infiltrate all of the suture fixation sites and the trocar sites.  I also did a bilateral TAPP block from the soft subcostal area into the lower quadrants.      A small incision was made in the left subcostal region and a Veress needle inserted and tested with saline.  Pneumoperitoneum to 15 mmHg was achieved.  The Veress needle was removed.  A 5 mm optical trocar was inserted without difficulty.  Pneumoperitoneum was maintained.  Video camera was inserted with visualization and findings as described above.  An 11 mm trocar was placed in the left flank and a 5 mm trocar placed in the left lower quadrant.  There were no intestinal adhesions.  I used the harmonic scalpel to take down the falciform ligament extensively and to  pull the fatty tissue out of the epigastric hernia.  I brought a 15 x 15 cm circular piece of ventrallex ST composite mesh to the operative field.  I marked a template on the abdominal wall for forced suture fixation sites.  #1 Novafil was placed at all 4 suture fixation sites of the mesh.  Great care was taken throughout the case to keep the rough side of the mesh up toward the abdominal wall and the adhesion barrier down toward the viscera.  After placing all of the sutures in the mesh the mesh was moistened, inserted through the trocar, and spread out according to the template.  Stab wound was made at each of the suture fixation sites.  Using a suture passer I drew the Novafil suture up through the abdominal wall making sure to get a 1 cm bite of tissue.  I lifted this up and the mesh deployed nicely with very little redundancy.  All 4 sutures were tied.  The mesh was further secured with the secure strap device in a double crown technique.  I inspected the mesh fixation at its perimeter on 3 occasions and there did not appear to be any gaps.  There was no bleeding.     The pneumoperitoneum was released.  The trochars were removed.  The trocar incisions were closed with subcuticular 4-0 Monocryl.  Dermabond was placed on the trocar sites and the 4 suture fixation sites.  Dry bandages and an abdominal binder were placed.  The patient tolerated the procedure well and was taken to PACU in stable condition.  EBL 10 cc.  Counts correct.  Complications none   Addendum: I logged onto the PMP aware website and reviewed her prescription medication history     Mckade Gurka M. Dalbert Batman, M.D., FACS General and Minimally Invasive Surgery Breast and Colorectal Surgery  03/19/2019 9:08 AM

## 2019-03-19 NOTE — Anesthesia Procedure Notes (Signed)
Procedure Name: Intubation Date/Time: 03/19/2019 7:58 AM Performed by: Imagene Riches, CRNA Pre-anesthesia Checklist: Patient identified, Emergency Drugs available, Suction available and Patient being monitored Patient Re-evaluated:Patient Re-evaluated prior to induction Oxygen Delivery Method: Circle System Utilized Preoxygenation: Pre-oxygenation with 100% oxygen Induction Type: IV induction Ventilation: Mask ventilation without difficulty Laryngoscope Size: Miller and 2 Grade View: Grade I Tube type: Oral Tube size: 7.0 mm Number of attempts: 1 Airway Equipment and Method: Stylet and Oral airway Placement Confirmation: ETT inserted through vocal cords under direct vision,  positive ETCO2 and breath sounds checked- equal and bilateral Secured at: 21 cm Tube secured with: Tape Dental Injury: Teeth and Oropharynx as per pre-operative assessment

## 2019-03-20 DIAGNOSIS — K436 Other and unspecified ventral hernia with obstruction, without gangrene: Secondary | ICD-10-CM | POA: Diagnosis not present

## 2019-03-20 NOTE — Discharge Summary (Signed)
Physician Discharge Summary  Patient ID:  Connie Murray  MRN: [id number]  DOB/AGE: 1970/10/05 48 y.o.  Admit date: 03/19/2019 Discharge date: 03/20/2019  Discharge Diagnoses:   Principal Problem:   Ventral hernia-supraumbilical Active Problems:   Back pain, chronic   Bipolar 1 disorder, mixed, moderate (HCC)   Asthma, chronic   Fibromyalgia   Incarcerated epigastric hernia   Operation: Procedure(s): LAPAROSCOPIC INCARCERATED [place] With MESH on 03/19/2019 Dalbert Batman - 03/19/2019  Discharged Condition: good  Hospital Course: Connie Murray is an 48 y.o. female whose primary care physician is System, Pcp Not In and who was admitted 03/19/2019 with a chief complaint of ventral hernia.   She was brought to the operating room on 03/19/2019 and underwent  LAPAROSCOPIC INCARCERATED [place].  She is one day post op.  She has some pain in her right upper quadrant, but is otherwise doing well. She is ready for discharge.   The discharge instructions were reviewed with the patient.  Consults: None  Significant Diagnostic Studies: Results for orders placed or performed during the hospital encounter of 03/19/19  CBC  Result Value Ref Range   WBC 8.4 4.0 - 10.5 K/uL   RBC 3.74 (L) 3.87 - 5.11 MIL/uL   Hemoglobin 10.0 (L) 12.0 - 15.0 g/dL   HCT 31.1 (L) 36.0 - 46.0 %   MCV 83.2 80.0 - 100.0 fL   MCH 26.7 26.0 - 34.0 pg   MCHC 32.2 30.0 - 36.0 g/dL   RDW 16.5 (H) 11.5 - 15.5 %   Platelets 334 150 - 400 K/uL   nRBC 0.0 0.0 - 0.2 %  Creatinine, serum  Result Value Ref Range   Creatinine, Ser 1.12 (H) 0.44 - 1.00 mg/dL   GFR calc non Af Amer 57 (L) >60 mL/min   GFR calc Af Amer >60 >60 mL/min    No results found.  Discharge Exam:  Vitals:   03/20/19 0801 03/20/19 0921  BP: (!) 102/59 (!) 102/43  Pulse: (!) 58 65  Resp: 18   Temp: 97.9 F (36.6 C)   SpO2: 100% 99%    General: WN older WR who is alert and generally healthy  appearing.  Lungs: Clear to auscultation and symmetric breath sounds. Heart:  RRR. No murmur or rub. Abdomen: Soft. No mass.  Has bowel sounds. Incisions looks good.  Discharge Medications:   Allergies as of 03/20/2019      Reactions   Bee Venom Anaphylaxis   Clindamycin/lincomycin Anaphylaxis   Molds & Smuts Shortness Of Breath   Benadryl [diphenhydramine Hcl] Other (See Comments)   Contains red dye, to which patient is allergic   Diclofenac Other (See Comments)   Cause kidney and liver problems   Nsaids Other (See Comments)   Affects kidney and liver   Statins Other (See Comments)   Caused liver problems   Acetaminophen Other (See Comments)   Affects whole body; "makes me feel like crap"   Codeine Itching   And a really, really bad headache   Darvocet [propoxyphene N-acetaminophen] Nausea And Vomiting   Latex Dermatitis, Rash   "Occurs mostly only in groin area."   Red Dye Itching, Rash   "Red streaks across throat, some itching" "Causes throat to become red and raw"   Tetracyclines & Related Nausea And Vomiting   "and severe stomach cramps"      Medication List    TAKE these medications   albuterol 108 (90 Base) MCG/ACT inhaler Commonly known  as: VENTOLIN HFA Inhale 2 puffs into the lungs every 6 (six) hours as needed for wheezing or shortness of breath.   ALPRAZolam 1 MG tablet Commonly known as: XANAX Take 1 mg by mouth 2 (two) times daily as needed for anxiety.   alprazolam 2 MG tablet Commonly known as: XANAX Take 1 tablet (2 mg total) by mouth at bedtime as needed for anxiety (3-4 times daily).   B-complex with vitamin C tablet Take 2 tablets by mouth daily.   BOTOX COSMETIC IM Inject into the muscle every 3 (three) months.   cloNIDine 0.1 MG tablet Commonly known as: CATAPRES Take 0.1 mg by mouth 4 (four) times daily.   Depo-Testosterone 100 MG/ML injection Generic drug: testosterone cypionate Inject 100 mg into the muscle every 8 (eight) weeks.    dicyclomine 10 MG capsule Commonly known as: BENTYL Take 1 tablet by mouth 3 times daily as needed for abdominal pain, cramping and spasms.   EPINEPHrine 0.3 mg/0.3 mL Soaj injection Commonly known as: EpiPen Inject 0.3 mLs (0.3 mg total) into the muscle once.   estradiol 2 MG tablet Commonly known as: ESTRACE Take 2 mg by mouth daily.   ezetimibe 10 MG tablet Commonly known as: ZETIA Take 1 tablet (10 mg total) by mouth daily.   fexofenadine 180 MG tablet Commonly known as: ALLEGRA Take 180 mg by mouth daily.   HYDROmorphone 2 MG tablet Commonly known as: DILAUDID Take 2 mg by mouth 3 (three) times daily as needed for severe pain.   hydrOXYzine 25 MG capsule Commonly known as: VISTARIL Take 25 mg by mouth 2 (two) times daily.   Hyoscyamine Sulfate SL 0.125 MG Subl Commonly known as: Levsin/SL Dissolve one tablet on the tongue every 6 hours for cramping and spasms.   lactobacillus acidophilus Tabs tablet Take 2 tablets by mouth 2 (two) times daily.   methadone 5 MG tablet Commonly known as: DOLOPHINE Take 5 mg by mouth every 8 (eight) hours.   methocarbamol 500 MG tablet Commonly known as: ROBAXIN Take 500 mg by mouth 2 (two) times daily.   omeprazole 40 MG capsule Commonly known as: PRILOSEC TAKE 1 CAPSULE BY MOUTH TWICE DAILY   ONE DAILY MENS 50+ MULTIVIT PO Take 1 tablet by mouth daily.   pantoprazole 40 MG tablet Commonly known as: Protonix Take 1 tablet (40 mg total) by mouth daily before breakfast.   PAPAYA ENZYME PO Take 3 tablets by mouth daily.   Potassium 99 MG Tabs Take 297 mg by mouth daily as needed (cramps).   prednisoLONE 5 MG Tabs tablet Take 5 mg by mouth daily.   promethazine 25 MG tablet Commonly known as: PHENERGAN Take 25 mg by mouth every 8 (eight) hours as needed for nausea or vomiting.   QUEtiapine 300 MG 24 hr tablet Commonly known as: SEROQUEL XR Take 1 tablet (300 mg total) by mouth at bedtime.   SUMAtriptan 100 MG  tablet Commonly known as: IMITREX Take 100 mg by mouth every 2 (two) hours as needed for migraine.   topiramate 25 MG tablet Commonly known as: TOPAMAX Take 100 mg by mouth daily.   VITAMIN D PO Take 1,000 Units by mouth daily.       Disposition: Discharge disposition: 01-Home or Self Care       Discharge Instructions    Diet - low sodium heart healthy   Complete by: As directed    Increase activity slowly   Complete by: As directed  Follow-up Information    Claud Kelp, MD. Schedule an appointment as soon as possible for a visit in 3 weeks.   Specialty: General Surgery Contact information: [address] [place] [address] [phone]            Signed: Ovidio Kin, M.D., [facility] Surgery Office:  [phone]  03/20/2019, 9:55 AM

## 2019-03-20 NOTE — Progress Notes (Signed)
Patient discharged to home. Verbalizes understanding of all discharge instructions including incision care, discharge medications, and follow up MD visits.  

## 2019-03-22 ENCOUNTER — Encounter (HOSPITAL_COMMUNITY): Payer: Self-pay | Admitting: General Surgery

## 2019-04-14 ENCOUNTER — Other Ambulatory Visit: Payer: Self-pay | Admitting: Gastroenterology

## 2019-04-30 ENCOUNTER — Ambulatory Visit: Payer: 59

## 2019-05-07 ENCOUNTER — Other Ambulatory Visit: Payer: Self-pay | Admitting: Physician Assistant

## 2019-05-13 ENCOUNTER — Telehealth: Payer: Self-pay | Admitting: Gastroenterology

## 2019-05-13 MED ORDER — OMEPRAZOLE 40 MG PO CPDR: 40.0000 mg | DELAYED_RELEASE_CAPSULE | Freq: Two times a day (BID) | ORAL | 0 refills | Status: DC

## 2019-05-13 NOTE — Telephone Encounter (Signed)
Prescription sent to patient's pharmacy until scheduled appt. 

## 2019-05-26 ENCOUNTER — Ambulatory Visit
Admission: RE | Admit: 2019-05-26 | Discharge: 2019-05-26 | Disposition: A | Discharge status: Home / Self Care | Payer: 59 | Source: Ambulatory Visit | Attending: Obstetrics & Gynecology | Admitting: Obstetrics & Gynecology

## 2019-05-26 ENCOUNTER — Other Ambulatory Visit: Payer: Self-pay

## 2019-05-26 DIAGNOSIS — Z1231 Encounter for screening mammogram for malignant neoplasm of breast: Secondary | ICD-10-CM

## 2019-06-18 ENCOUNTER — Ambulatory Visit: Payer: 59 | Admitting: Gastroenterology

## 2019-08-04 ENCOUNTER — Other Ambulatory Visit: Payer: Self-pay | Admitting: Gastroenterology

## 2019-08-18 ENCOUNTER — Other Ambulatory Visit: Payer: Self-pay | Admitting: Obstetrics & Gynecology

## 2019-08-18 DIAGNOSIS — N63 Unspecified lump in unspecified breast: Secondary | ICD-10-CM

## 2019-09-03 ENCOUNTER — Other Ambulatory Visit: Payer: 59

## 2019-09-10 ENCOUNTER — Other Ambulatory Visit: Payer: 59

## 2019-09-22 ENCOUNTER — Other Ambulatory Visit: Payer: Self-pay

## 2019-09-22 ENCOUNTER — Other Ambulatory Visit: Payer: Self-pay | Admitting: Obstetrics & Gynecology

## 2019-09-22 ENCOUNTER — Ambulatory Visit
Admission: RE | Admit: 2019-09-22 | Discharge: 2019-09-22 | Disposition: A | Discharge status: Home / Self Care | Payer: 59 | Source: Ambulatory Visit | Attending: Obstetrics & Gynecology | Admitting: Obstetrics & Gynecology

## 2019-09-22 DIAGNOSIS — N63 Unspecified lump in unspecified breast: Secondary | ICD-10-CM

## 2019-09-22 DIAGNOSIS — N6320 Unspecified lump in the left breast, unspecified quadrant: Secondary | ICD-10-CM

## 2019-11-04 ENCOUNTER — Other Ambulatory Visit: Payer: Self-pay

## 2019-11-04 ENCOUNTER — Emergency Department (HOSPITAL_COMMUNITY)
Admission: EM | Admit: 2019-11-04 | Discharge: 2019-11-05 | Disposition: A | Discharge status: Home / Self Care | Payer: 59 | Attending: Emergency Medicine | Admitting: Emergency Medicine

## 2019-11-04 ENCOUNTER — Encounter (HOSPITAL_COMMUNITY): Payer: Self-pay

## 2019-11-04 DIAGNOSIS — R109 Unspecified abdominal pain: Secondary | ICD-10-CM | POA: Diagnosis not present

## 2019-11-04 DIAGNOSIS — Z5321 Procedure and treatment not carried out due to patient leaving prior to being seen by health care provider: Secondary | ICD-10-CM | POA: Diagnosis not present

## 2019-11-04 LAB — COMPREHENSIVE METABOLIC PANEL
ALT: 15 U/L (ref 0–44)
AST: 20 U/L (ref 15–41)
Albumin: 3.4 g/dL — ABNORMAL LOW (ref 3.5–5.0)
Alkaline Phosphatase: 61 U/L (ref 38–126)
Anion gap: 10 (ref 5–15)
BUN: 18 mg/dL (ref 6–20)
CO2: 21 mmol/L — ABNORMAL LOW (ref 22–32)
Calcium: 9.1 mg/dL (ref 8.9–10.3)
Chloride: 105 mmol/L (ref 98–111)
Creatinine, Ser: 1.16 mg/dL — ABNORMAL HIGH (ref 0.44–1.00)
GFR calc Af Amer: >60 mL/min (ref >60)
GFR calc non Af Amer: 54 mL/min — ABNORMAL LOW (ref >60)
Glucose, Bld: 110 mg/dL — ABNORMAL HIGH (ref 70–99)
Potassium: 3.8 mmol/L (ref 3.5–5.1)
Sodium: 136 mmol/L (ref 135–145)
Total Bilirubin: 0.4 mg/dL (ref 0.3–1.2)
Total Protein: 6.6 g/dL (ref 6.5–8.1)

## 2019-11-04 LAB — URINALYSIS, ROUTINE W REFLEX MICROSCOPIC
Bilirubin Urine: NEGATIVE
Glucose, UA: NEGATIVE mg/dL
Hgb urine dipstick: NEGATIVE
Ketones, ur: NEGATIVE mg/dL
Leukocytes,Ua: NEGATIVE
Nitrite: NEGATIVE
Protein, ur: NEGATIVE mg/dL
Specific Gravity, Urine: 1.005 (ref 1.005–1.030)
pH: 6 (ref 5.0–8.0)

## 2019-11-04 LAB — CBC
HCT: 31.7 % — ABNORMAL LOW (ref 36.0–46.0)
Hemoglobin: 10 g/dL — ABNORMAL LOW (ref 12.0–15.0)
MCH: 25.8 pg — ABNORMAL LOW (ref 26.0–34.0)
MCHC: 31.5 g/dL (ref 30.0–36.0)
MCV: 81.7 fL (ref 80.0–100.0)
Platelets: 371 10*3/uL (ref 150–400)
RBC: 3.88 MIL/uL (ref 3.87–5.11)
RDW: 16.7 % — ABNORMAL HIGH (ref 11.5–15.5)
WBC: 6.6 10*3/uL (ref 4.0–10.5)
nRBC: 0 % (ref 0.0–0.2)

## 2019-11-04 LAB — LIPASE, BLOOD: Lipase: 34 U/L (ref 11–51)

## 2019-11-04 LAB — I-STAT BETA HCG BLOOD, ED (MC, WL, AP ONLY): I-stat hCG, quantitative: <5 m[IU]/mL (ref <5)

## 2019-11-04 MED ORDER — SODIUM CHLORIDE 0.9% FLUSH: 3.0000 mL | Freq: Once | INTRAVENOUS | Status: DC

## 2019-11-04 NOTE — ED Triage Notes (Signed)
Pt arrives to ED w/ c/o 10/10 RLQ abdominal pain. Pt endorses diarrhea, denies n/v.

## 2019-11-05 NOTE — ED Notes (Signed)
Pt stated they were leaving  

## 2019-11-16 ENCOUNTER — Encounter: Payer: Self-pay | Admitting: Physician Assistant

## 2019-11-16 ENCOUNTER — Ambulatory Visit (INDEPENDENT_AMBULATORY_CARE_PROVIDER_SITE_OTHER): Payer: 59 | Admitting: Physician Assistant

## 2019-11-16 VITALS — BP 134/80 | HR 82 | Ht 62.0 in | Wt 122.2 lb

## 2019-11-16 DIAGNOSIS — R1084 Generalized abdominal pain: Secondary | ICD-10-CM | POA: Diagnosis not present

## 2019-11-16 DIAGNOSIS — R194 Change in bowel habit: Secondary | ICD-10-CM

## 2019-11-16 MED ORDER — SUTAB 1479-225-188 MG PO TABS: 1.0000 | ORAL_TABLET | Freq: Once | ORAL | 0 refills | Status: AC

## 2019-11-16 NOTE — Progress Notes (Signed)
Reviewed and agree with management plan.  Ashtan Laton T. Dilpreet Faires, MD FACG [facility]  

## 2019-11-16 NOTE — Patient Instructions (Signed)
If you are age 51 or older, your body mass index should be between 23-30. Your Body mass index is 22.36 kg/m. If this is out of the aforementioned range listed, please consider follow up with your Primary Care Provider.  If you are age 90 or younger, your body mass index should be between 19-25. Your Body mass index is 22.36 kg/m. If this is out of the aformentioned range listed, please consider follow up with your Primary Care Provider.   You have been scheduled for a colonoscopy. Please follow written instructions given to you at your visit today.  Please pick up your prep supplies at the pharmacy within the next 1-3 days. If you use inhalers (even only as needed), please bring them with you on the day of your procedure.  Due to recent changes in healthcare laws, you may see the results of your imaging and laboratory studies on MyChart before your provider has had a chance to review them.  We understand that in some cases there may be results that are confusing or concerning to you. Not all laboratory results come back in the same time frame and the provider may be waiting for multiple results in order to interpret others.  Please give [place] 48 hours in order for your provider to thoroughly review all the results before contacting the office for clarification of your results.

## 2019-11-16 NOTE — Progress Notes (Signed)
Chief Complaint: Abdominal pain, diarrhea  HPI:    Connie Murray is a 49 year old female with a past medical history as listed below, known to Dr. Russella DarStark, who presents to clinic today for complaint of abdominal pain and diarrhea.    06/05/2010 colonoscopy with inflammation of the ileocecal valve and a 4 mm ulcer at the hepatic flexure, normal tone renal ileum.  Biopsy showed multiple inflammatory changes reactive biopsy from the hepatic flexure, ulceration showed chronic ischemia.    02/27/2017 EGD for dysphagia with esophageal mucosal changes suggestive of eosinophilic esophagitis, benign-appearing esophageal stenosis dilated, nonbleeding erosive gastropathy and normal duodenal bulb and second portion of duodenum.  Pathology showed mild reflux changes.    01/16/2018 office visit with Mike GipAmy Esterwood for mid and upper abdominal pain over the past 6 to 8 months.  That time seem to be triggered by stress and anxiety.  It was thought her symptoms were functional and related to overwhelming stress and anxiety in her life.  She was started on Levsin 1 every morning and repeat every 6-8 hours as needed.  Start IBgard 2 p.o. twice daily.  Restart Protonix 40 mg p.o. every morning.    01/28/2018 patient changed to Bentyl 10 mg p.o. 3 times daily as needed.    03/19/2019 patient had a laparoscopic incarcerated epigastric hernia repair with mesh.    11/04/2019 CBC with a hemoglobin minimally decreased at 10, CMP with a creatinine minimally elevated at 1.16.    Today, patient presents to clinic and describes a variety of abdominal pain.  Tells me that "I always have pain somewhere".  Describes a history of previously being on what she thinks was Pentasa for a diagnosis of inflammatory bowel disease years ago, but "this ruined my kidneys", so she stopped this medicine but tells me that while taking it she had no abdominal pain or problems.      She has had a lot of pain in her right upper quadrant and down to her  bellybutton, but the other day this moved to her right lower quadrant and she started reading online that this could be her appendix.  This is when she presented to the ER.  Describes that around that time she had some diarrhea which is "very abnormal for me", describes this as a fluffy formed stool as well as increased gas nausea and terrible abdominal cramping which stopped after about 2 weeks.  Describes being on a probiotic and taking Dulcolax as needed to maintain regular bowel movements.  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  She is wondering about Biologics.  Describes previously being on Donnatel, the Bentyl and hyoscyamine never helped.    Also tells me she is very "sensitive to other peoples pains", apparently a friend was recently diagnosed with "something in her pancreas".    Denies fever, chills, blood in her stool or symptoms that awaken her from sleep.     Past Medical History:  Diagnosis Date  . Allergy   . Anxiety   . Asthma   . Bipolar disorder (HCC)   . Breast mass, right 12/30/2016  . Cervical spondylosis   . Chronic headaches   . Chronic lower back pain   . Degenerative arthritis   . Depression   . Dyslipidemia   . Fibromyalgia   . GERD (gastroesophageal reflux disease)   . Heart murmur  cildhood heart murmur  . Hyperlipidemia   . Hypertension   . Migraine   . Myalgia and myositis, unspecified 11/27/2012  . Normocytic anemia   . Palpitations   . Pneumonia   . Radiculopathy    C8 or T1    Past Surgical History:  Procedure Laterality Date  . ABDOMINAL HYSTERECTOMY    . BREAST BIOPSY Right 01/19/2016  . BREAST BIOPSY Right 01/19/2016  . BREAST LUMPECTOMY WITH RADIOACTIVE SEED LOCALIZATION Right 12/30/2016   Procedure: RIGHT BREAST LUMPECTOMY WITH RADIOACTIVE SEED LOCALIZATION;  Surgeon: Claud Kelp, MD;  Location: [facility];  Service: General;   Laterality: Right;  ERAS PATHWAY  . Broken Foot Right   . dental     3 extractions February 2019  . DENTAL RESTORATION/EXTRACTION WITH X-RAY     9 total  . EYE SURGERY     bilateral cataract removal with lens placement June & July 2020  . NASAL SINUS SURGERY    . TOTAL ABDOMINAL HYSTERECTOMY W/ BILATERAL SALPINGOOPHORECTOMY  01/2010  . VENTRAL HERNIA REPAIR N/A 03/19/2019   Procedure: LAPAROSCOPIC INCARCERATED EPIGASTIRC HERNIA REPAIR With MESH;  Surgeon: Claud Kelp, MD;  [facility];  Service: General;  Laterality: N/A;    Current Outpatient Medications  Medication Sig Dispense Refill  . albuterol (PROVENTIL HFA;VENTOLIN HFA) 108 (90 BASE) MCG/ACT inhaler Inhale 2 puffs into the lungs every 6 (six) hours as needed for wheezing or shortness of breath. 8.5 g 3  . ALPRAZolam (XANAX) 1 MG tablet Take 1 mg by mouth 2 (two) times daily as needed for anxiety.    Marland Kitchen alprazolam (XANAX) 2 MG tablet Take 1 tablet (2 mg total) by mouth at bedtime as needed for anxiety (3-4 times daily). (Patient not taking: Reported on 03/11/2019) 20 tablet 0  . B Complex-C (B-COMPLEX WITH VITAMIN C) tablet Take 2 tablets by mouth daily.     . Cholecalciferol (VITAMIN D PO) Take 1,000 Units by mouth daily.     . cloNIDine (CATAPRES) 0.1 MG tablet Take 0.1 mg by mouth 4 (four) times daily.    Marland Kitchen dicyclomine (BENTYL) 10 MG capsule Take 1 tablet by mouth 3 times daily as needed for abdominal pain, cramping and spasms. (Patient not taking: Reported on 03/11/2019) 90 capsule 2  . EPINEPHrine (EPIPEN) 0.3 mg/0.3 mL SOAJ Inject 0.3 mLs (0.3 mg total) into the muscle once. 1 Device 5  . estradiol (ESTRACE) 2 MG tablet Take 2 mg by mouth daily.    Marland Kitchen ezetimibe (ZETIA) 10 MG tablet Take 1 tablet (10 mg total) by mouth daily. 30 tablet 3  . fexofenadine (ALLEGRA) 180 MG tablet Take 180 mg by mouth daily.    Marland Kitchen HYDROmorphone (DILAUDID) 2 MG tablet Take 2 mg by mouth 3 (three) times daily as needed for severe pain.    .  hydrOXYzine (VISTARIL) 25 MG capsule Take 25 mg by mouth 2 (two) times daily.    Marland Kitchen Hyoscyamine Sulfate SL (LEVSIN/SL) 0.125 MG SUBL Dissolve one tablet on the tongue every 6 hours for cramping and spasms. (Patient not taking: Reported on 03/11/2019) 100 each 6  . lactobacillus acidophilus (BACID) TABS tablet Take 2 tablets by mouth 2 (two) times daily.    . methadone (DOLOPHINE) 5 MG tablet Take 5 mg by mouth every 8 (eight) hours.   0  . methocarbamol (ROBAXIN) 500 MG tablet Take 500 mg by mouth 2 (two) times daily.    . Multiple Vitamins-Minerals (ONE DAILY MENS 50+ MULTIVIT PO) Take 1  tablet by mouth daily.    Marland Kitchen omeprazole (PRILOSEC) 40 MG capsule Take 1 capsule (40 mg total) by mouth 2 (two) times daily. 180 capsule 0  . OnabotulinumtoxinA, Cosmetic, (BOTOX COSMETIC IM) Inject into the muscle every 3 (three) months.    . pantoprazole (PROTONIX) 40 MG tablet Take 1 tablet (40 mg total) by mouth daily before breakfast. (Patient not taking: Reported on 03/11/2019) 30 tablet 11  . PAPAYA ENZYME PO Take 3 tablets by mouth daily.    . Potassium 99 MG TABS Take 297 mg by mouth daily as needed (cramps).    . prednisoLONE 5 MG TABS tablet Take 5 mg by mouth daily.    . promethazine (PHENERGAN) 25 MG tablet Take 25 mg by mouth every 8 (eight) hours as needed for nausea or vomiting.    Marland Kitchen QUEtiapine (SEROQUEL XR) 300 MG 24 hr tablet Take 1 tablet (300 mg total) by mouth at bedtime. (Patient not taking: Reported on 03/11/2019) 30 tablet 2  . SUMAtriptan (IMITREX) 100 MG tablet Take 100 mg by mouth every 2 (two) hours as needed for migraine.     Marland Kitchen testosterone cypionate (DEPO-TESTOSTERONE) 100 MG/ML injection Inject 100 mg into the muscle every 8 (eight) weeks.    . topiramate (TOPAMAX) 25 MG tablet Take 100 mg by mouth daily.     No current facility-administered medications for this visit.    Allergies as of 11/16/2019 - Review Complete 11/04/2019  Allergen Reaction Noted  . Bee venom Anaphylaxis  12/14/2012  . Clindamycin/lincomycin Anaphylaxis 09/13/2017  . Molds & smuts Shortness Of Breath 12/14/2012  . Benadryl [diphenhydramine hcl] Other (See Comments) 10/09/2011  . Diclofenac Other (See Comments) 02/26/2012  . Nsaids Other (See Comments) 12/12/2012  . Statins Other (See Comments) 02/26/2012  . Acetaminophen Other (See Comments) 10/09/2011  . Codeine Itching 10/09/2011  . Darvocet [propoxyphene n-acetaminophen] Nausea And Vomiting 10/09/2011  . Latex Dermatitis and Rash 10/09/2011  . Red dye Itching and Rash 10/09/2011  . Tetracyclines & related Nausea And Vomiting 10/09/2011    Family History  Problem Relation Age of Onset  . Ovarian cancer Mother   . Alcoholism Father   . Irritable bowel syndrome Other   . Bone cancer Maternal Uncle   . Breast cancer Paternal Grandmother   . Brain cancer Maternal Uncle   . Colon cancer Neg Hx   . Esophageal cancer Neg Hx   . Pancreatic cancer Neg Hx   . Rectal cancer Neg Hx   . Stomach cancer Neg Hx   . Prostate cancer Neg Hx     Social History   Socioeconomic History  . Marital status: Single    Spouse name: Not on file  . Number of children: 1  . Years of education: Not on file  . Highest education level: Not on file  Occupational History  . Occupation: Disabled    Employer: DISABLED  Tobacco Use  . Smoking status: Current Every Day Smoker    Packs/day: 1.00    Years: 28.00    Pack years: 28.00    Types: Cigarettes    Start date: 12/13/1982  . Smokeless tobacco: Never Used  . Tobacco comment: between 1/2 - 1 pack cigs per day  Vaping Use  . Vaping Use: Former  Substance and Sexual Activity  . Alcohol use: No  . Drug use: No  . Sexual activity: Not on file  Other Topics Concern  . Not on file  Social History Narrative  . Not on file  Social Determinants of Health   Financial Resource Strain:   . Difficulty of Paying Living Expenses:   Food Insecurity:   . Worried About Programme researcher, broadcasting/film/video in the Last  Year:   . Barista in the Last Year:   Transportation Needs:   . Freight forwarder (Medical):   Marland Kitchen Lack of Transportation (Non-Medical):   Physical Activity:   . Days of Exercise per Week:   . Minutes of Exercise per Session:   Stress:   . Feeling of Stress :   Social Connections:   . Frequency of Communication with Friends and Family:   . Frequency of Social Gatherings with Friends and Family:   . Attends Religious Services:   . Active Member of Clubs or Organizations:   . Attends Banker Meetings:   Marland Kitchen Marital Status:   Intimate Partner Violence:   . Fear of Current or Ex-Partner:   . Emotionally Abused:   Marland Kitchen Physically Abused:   . Sexually Abused:     Review of Systems:    Constitutional: No weight loss, fever or chills Cardiovascular: No chest pain Respiratory: No SOB Gastrointestinal: See HPI and otherwise negative   Physical Exam:  Vital signs: BP 134/80   Pulse 82   Ht  (1.575 m)   Wt 122 lb 4 oz (55.5 kg)   BMI 22.36 kg/m   Constitutional:    Caucasian female appears to be in NAD, Well developed, Well nourished, alert and cooperative Respiratory: Respirations even and unlabored. Lungs clear to auscultation bilaterally.   No wheezes, crackles, or rhonchi.  Cardiovascular: Normal S1, S2. No MRG. Regular rate and rhythm. No peripheral edema, cyanosis or pallor.  Gastrointestinal:  Soft, nondistended, mild RLQ ttp. No rebound or guarding. Normal bowel sounds. No appreciable masses or hepatomegaly. Rectal:  Not performed.  Psychiatric: Demonstrates good judgement and reason without abnormal affect or behaviors.  RELEVANT LABS AND IMAGING: CBC    Component Value Date/Time   WBC 6.6 11/04/2019 2112   RBC 3.88 11/04/2019 2112   HGB 10.0 (L) 11/04/2019 2112   HCT 31.7 (L) 11/04/2019 2112   PLT 371 11/04/2019 2112   MCV 81.7 11/04/2019 2112   MCV 99.3 (A) 12/12/2012 1137   MCH 25.8 (L) 11/04/2019 2112   MCHC 31.5 11/04/2019 2112   RDW  16.7 (H) 11/04/2019 2112   LYMPHSABS 2.1 03/17/2019 0959   MONOABS 0.6 03/17/2019 0959   EOSABS 0.1 03/17/2019 0959   BASOSABS 0.1 03/17/2019 0959    CMP     Component Value Date/Time   NA 136 11/04/2019 2112   K 3.8 11/04/2019 2112   CL 105 11/04/2019 2112   CO2 21 (L) 11/04/2019 2112   GLUCOSE 110 (H) 11/04/2019 2112   BUN 18 11/04/2019 2112   CREATININE 1.16 (H) 11/04/2019 2112   CREATININE 1.41 (H) 03/09/2013 1444   CALCIUM 9.1 11/04/2019 2112   PROT 6.6 11/04/2019 2112   ALBUMIN 3.4 (L) 11/04/2019 2112   AST 20 11/04/2019 2112   ALT 15 11/04/2019 2112   ALKPHOS 61 11/04/2019 2112   BILITOT 0.4 11/04/2019 2112   GFRNONAA 54 (L) 11/04/2019 2112   GFRAA >60 11/04/2019 2112    Assessment: 1.  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  Change in bowel habits: With some loose stools recently  which is very abnormal for her, now back to constipation  Plan: 1.  Reviewed patient's recent normal labs with her.  Discussed that I do not feel she has appendicitis.  This is her biggest worry today. 2.  I recommend she go and ahead and have her colonoscopy, it has been almost 10 years and she has continued with generalized abdominal pain and now has had a change in bowel habits.  She is convinced that she has IBD.  We we will schedule colonoscopy in the [facility] with Dr. Russella Dar for further evaluation.  Did discuss risks, benefits, limitations and alternatives and the patient agrees to proceed.  Patient will be Covid tested 2 days prior to time of procedure. 3.  Patient tells me that none of the medications we have given her before work.  For that matter we will hold off on prescribing any further. 4.  Patient to follow in clinic per recommendations from Dr. Russella Dar after time of procedure.  Hyacinth Meeker, PA-C [facility] 11/16/2019, 10:13 AM

## 2019-11-24 ENCOUNTER — Telehealth: Payer: Self-pay | Admitting: Gastroenterology

## 2019-11-24 NOTE — Telephone Encounter (Signed)
Pt states the prep for the procedure with SUTAB is not covered by her insurance [employer]. Pt is wanting to know if there is a cheaper alternative. Pt states she hasn't received a prescription for her Omeprazole.

## 2019-11-25 ENCOUNTER — Other Ambulatory Visit: Payer: Self-pay

## 2019-11-25 MED ORDER — OMEPRAZOLE 40 MG PO CPDR: 40.0000 mg | DELAYED_RELEASE_CAPSULE | Freq: Two times a day (BID) | ORAL | 3 refills | Status: DC

## 2019-11-25 NOTE — Telephone Encounter (Signed)
Informed patient I called her pharmacy and with the Sutab coupon the prep is $40. Patient states she was just wondering why her insurance did not cover the prep because she normally does not have issues with medication coverage. Informed patient that insurance companies never cover the preps but that is why we send the coupon codes with the prescription. Patient states she will pay the $40 because she does not want to drink a high volume prep. Patient also requests a refill of omeprazole sent to her pharmacy. Patient states she was taking it over the counter but it will cheaper as a prescription. Prescription sent to patient's pharmacy.

## 2019-12-23 ENCOUNTER — Other Ambulatory Visit: Payer: 59

## 2019-12-23 ENCOUNTER — Telehealth: Payer: Self-pay | Admitting: Physician Assistant

## 2019-12-23 NOTE — Telephone Encounter (Signed)
Spoke with patient, pt states that she is still taking Omeprazole 40 mg twice a day and will pick up Pepcid 20 mg today and take it twice a day. Advised that if this is unhelpful or if pain worsens she will need to go to the ED for further evaluation.

## 2019-12-23 NOTE — Telephone Encounter (Signed)
Can we make sure that she is taking her Omeprazole 40 BID and if so, can add Famotidine 20 mg BID.  If this is unhelpful or pain increases, agree with recommendation for ER eval.  Thanks-JLL

## 2019-12-23 NOTE — Telephone Encounter (Signed)
Spoke with patient, pt reports epigastric pain that she has had for about 4-5 days that is getting progressively worse, "feels like a severely strained muscle", umbilical hernia repair 03/19/2019, pt states that her pulse has been erratic - pt denies any cardiac issues, pt states that her xanax does not help , pt states that she has been feeling feverish. Pt states that she is taking 2 mg dilaudid up to 2 a day and it has not really helped, pt states that almost every time as soon as she eats and the food hits her stomach she feels pain, pt describes her stool as fluffy, not solid or runny. Pt states that she ate rice the other day and was in pain, pt denies a gluten allergy to her knowledge. Pt reports pain at a 5/6 on a scale of 1-10. Pt advised that if her paing gets severe she will have to go to the ED to be evaluated, pt states that she did that the last time and had to wait almost 11 hours. Pt is scheduled for colon on 01/07/20 at 2 pm with Dr. Russella Dar, pt seen by Victorino Dike on 11/16/19. Please advise on any recommendations until colon. Thank you

## 2020-01-05 ENCOUNTER — Other Ambulatory Visit: Payer: Self-pay | Admitting: Gastroenterology

## 2020-01-05 ENCOUNTER — Ambulatory Visit (INDEPENDENT_AMBULATORY_CARE_PROVIDER_SITE_OTHER): Payer: 59

## 2020-01-05 DIAGNOSIS — Z1159 Encounter for screening for other viral diseases: Secondary | ICD-10-CM

## 2020-01-05 LAB — SARS CORONAVIRUS 2 (TAT 6-24 HRS): SARS Coronavirus 2: POSITIVE — AB

## 2020-01-06 ENCOUNTER — Other Ambulatory Visit: Payer: Self-pay

## 2020-01-07 ENCOUNTER — Encounter: Payer: Self-pay | Admitting: Gastroenterology

## 2020-02-21 ENCOUNTER — Encounter: Payer: 59 | Admitting: Gastroenterology

## 2020-02-21 ENCOUNTER — Telehealth: Payer: Self-pay | Admitting: Gastroenterology

## 2020-02-21 DIAGNOSIS — R197 Diarrhea, unspecified: Secondary | ICD-10-CM

## 2020-02-21 DIAGNOSIS — R194 Change in bowel habit: Secondary | ICD-10-CM

## 2020-02-21 DIAGNOSIS — R1084 Generalized abdominal pain: Secondary | ICD-10-CM

## 2020-02-21 MED ORDER — ONDANSETRON 4 MG PO TBDP
ORAL_TABLET | ORAL | 0 refills | Status: DC
Start: 2020-02-21 — End: 2020-04-27

## 2020-02-21 NOTE — Telephone Encounter (Signed)
What is the diagnosis for labs and CT?

## 2020-02-21 NOTE — Addendum Note (Signed)
Addended by: Annett Fabian on: 02/21/2020 05:12 PM   Modules accepted: Orders

## 2020-02-21 NOTE — Telephone Encounter (Signed)
See staff message regarding this patient.

## 2020-02-21 NOTE — Telephone Encounter (Signed)
Stark, Malcolm T, MD sent to ,  L, RN Pt did not complete prep for colonoscopy today however she would like to reschedule. She had vomiting with the first prep dose and did not take the second dose as her driver cancelled. This is her 2nd reschedule for this procedure.   She had a prescription bottle for Pentasa 500 mg from 2008 with my name on it that she wanted to show me. She is concerned that she has IBD or cancer. I confirmed her evaluations since 2012 have not shown IBD or a GI cancer however she needs further evaluation at this time.   Reschedule colonoscopy and remind her that she needs to complete the prep and complete the colonoscopy this time. Please provide Zofran before both prep doses and try a different prep since she vomited.  CBC, CMP, lipase, TSH, tTG, IgA, ESR, CRP and CT AP (without contrast if Cr elevated).   

## 2020-02-21 NOTE — Telephone Encounter (Signed)
Pt is requesting a call back from a nurse to discuss the procedure she is scheduled for today with Dr Russella Dar.

## 2020-02-21 NOTE — Telephone Encounter (Signed)
Attempted to return call no answer. .  Procedure was this am at 10:00 with a 9:00 arrival time.  Just seeing message now. I am not sure if she showed up.  I discussed with PCCs that patient should have been allowed to speak with a nurse in admitting rather than take a message.

## 2020-02-21 NOTE — Telephone Encounter (Signed)
Same as 10/2019 office visit: Generalized abdominal pain, change in bowel habits, diarrhea.

## 2020-02-21 NOTE — Telephone Encounter (Signed)
Patient has been scheduled for CT [facility] on 03/02/20 at 3:00.  She is aware and will pick up her contrast and instructions tomorrow.  She will come for labs tomorrow.  She will bring an updated copy of her Medicaid card to add to her file tomorrow.  Colonoscopy and pre-visit scheduled with the patient for 12/1 and 12/15.  Zofran rx sent.  She understands to keep on hand for her colon prep in December.

## 2020-02-22 ENCOUNTER — Other Ambulatory Visit (INDEPENDENT_AMBULATORY_CARE_PROVIDER_SITE_OTHER): Payer: 59

## 2020-02-22 DIAGNOSIS — R1084 Generalized abdominal pain: Secondary | ICD-10-CM | POA: Diagnosis not present

## 2020-02-22 DIAGNOSIS — R194 Change in bowel habit: Secondary | ICD-10-CM | POA: Diagnosis not present

## 2020-02-22 DIAGNOSIS — R197 Diarrhea, unspecified: Secondary | ICD-10-CM | POA: Diagnosis not present

## 2020-02-22 LAB — CBC
HCT: 33.7 % — ABNORMAL LOW (ref 36.0–46.0)
Hemoglobin: 10.8 g/dL — ABNORMAL LOW (ref 12.0–15.0)
MCHC: 32 g/dL (ref 30.0–36.0)
MCV: 77.2 fl — ABNORMAL LOW (ref 78.0–100.0)
Platelets: 457 10*3/uL — ABNORMAL HIGH (ref 150.0–400.0)
RBC: 4.36 Mil/uL (ref 3.87–5.11)
RDW: 19.5 % — ABNORMAL HIGH (ref 11.5–15.5)
WBC: 9.2 10*3/uL (ref 4.0–10.5)

## 2020-02-22 LAB — COMPREHENSIVE METABOLIC PANEL
ALT: 13 U/L (ref 0–35)
AST: 16 U/L (ref 0–37)
Albumin: 4.4 g/dL (ref 3.5–5.2)
Alkaline Phosphatase: 61 U/L (ref 39–117)
BUN: 16 mg/dL (ref 6–23)
CO2: 22 mEq/L (ref 19–32)
Calcium: 9.5 mg/dL (ref 8.4–10.5)
Chloride: 107 mEq/L (ref 96–112)
Creatinine, Ser: 1.11 mg/dL (ref 0.40–1.20)
GFR: 57.17 mL/min — ABNORMAL LOW (ref >60.00)
Glucose, Bld: 114 mg/dL — ABNORMAL HIGH (ref 70–99)
Potassium: 3.8 mEq/L (ref 3.5–5.1)
Sodium: 139 mEq/L (ref 135–145)
Total Bilirubin: 0.3 mg/dL (ref 0.2–1.2)
Total Protein: 7.9 g/dL (ref 6.0–8.3)

## 2020-02-22 LAB — TSH: TSH: 1.13 u[IU]/mL (ref 0.35–4.50)

## 2020-02-22 LAB — SEDIMENTATION RATE: Sed Rate: 23 mm/hr (ref 0–30)

## 2020-02-22 LAB — LIPASE: Lipase: 245 U/L — ABNORMAL HIGH (ref 11.0–59.0)

## 2020-02-22 LAB — C-REACTIVE PROTEIN: CRP: <1 mg/dL (ref 0.5–20.0)

## 2020-02-23 ENCOUNTER — Other Ambulatory Visit: Payer: Self-pay

## 2020-02-23 DIAGNOSIS — D649 Anemia, unspecified: Secondary | ICD-10-CM

## 2020-02-23 LAB — TISSUE TRANSGLUTAMINASE, IGA: (tTG) Ab, IgA: <1 U/mL

## 2020-02-24 ENCOUNTER — Other Ambulatory Visit (INDEPENDENT_AMBULATORY_CARE_PROVIDER_SITE_OTHER): Payer: 59

## 2020-02-24 DIAGNOSIS — D649 Anemia, unspecified: Secondary | ICD-10-CM | POA: Diagnosis not present

## 2020-02-24 LAB — IBC + FERRITIN
Ferritin: 5.6 ng/mL — ABNORMAL LOW (ref 10.0–291.0)
Iron: 63 ug/dL (ref 42–145)
Saturation Ratios: 15.7 % — ABNORMAL LOW (ref 20.0–50.0)
Transferrin: 286 mg/dL (ref 212.0–360.0)

## 2020-02-24 LAB — VITAMIN B12: Vitamin B-12: 874 pg/mL (ref 211–911)

## 2020-02-24 LAB — FOLATE: Folate: >24.8 ng/mL (ref >5.9)

## 2020-02-25 ENCOUNTER — Other Ambulatory Visit: Payer: Self-pay

## 2020-02-25 DIAGNOSIS — D649 Anemia, unspecified: Secondary | ICD-10-CM

## 2020-02-25 DIAGNOSIS — D509 Iron deficiency anemia, unspecified: Secondary | ICD-10-CM

## 2020-02-25 MED ORDER — FERROUS GLUCONATE 324 (37.5 FE) MG PO TABS: 1.0000 | ORAL_TABLET | Freq: Two times a day (BID) | ORAL | 2 refills | Status: DC

## 2020-03-02 ENCOUNTER — Ambulatory Visit (HOSPITAL_COMMUNITY)
Admission: RE | Admit: 2020-03-02 | Discharge: 2020-03-02 | Disposition: A | Discharge status: Home / Self Care | Payer: 59 | Source: Ambulatory Visit | Attending: Gastroenterology | Admitting: Gastroenterology

## 2020-03-02 ENCOUNTER — Other Ambulatory Visit: Payer: Self-pay

## 2020-03-02 ENCOUNTER — Encounter (HOSPITAL_COMMUNITY): Payer: Self-pay

## 2020-03-02 DIAGNOSIS — R194 Change in bowel habit: Secondary | ICD-10-CM | POA: Insufficient documentation

## 2020-03-02 DIAGNOSIS — R197 Diarrhea, unspecified: Secondary | ICD-10-CM | POA: Diagnosis present

## 2020-03-02 DIAGNOSIS — R1084 Generalized abdominal pain: Secondary | ICD-10-CM | POA: Diagnosis present

## 2020-03-02 MED ORDER — IOHEXOL 300 MG/ML  SOLN
100.0000 mL | Freq: Once | INTRAMUSCULAR | Status: AC | PRN
  Administered 2020-03-02: 100 mL via INTRAVENOUS

## 2020-03-23 ENCOUNTER — Telehealth: Payer: Self-pay | Admitting: Hematology and Oncology

## 2020-03-23 NOTE — Telephone Encounter (Signed)
Connie Murray has returned my call to schedule a new hem appt. She was referred by Dr. Russella Dar at [facility] for IDA. She's schedled to see Dr. Leonides Schanz on 11/26 at 9am. Pt aware to arrive 15 minutes early.

## 2020-03-31 ENCOUNTER — Telehealth: Payer: Self-pay | Admitting: Gastroenterology

## 2020-03-31 ENCOUNTER — Other Ambulatory Visit: Payer: Self-pay

## 2020-03-31 ENCOUNTER — Other Ambulatory Visit: Payer: 59

## 2020-03-31 DIAGNOSIS — R194 Change in bowel habit: Secondary | ICD-10-CM

## 2020-03-31 DIAGNOSIS — R197 Diarrhea, unspecified: Secondary | ICD-10-CM

## 2020-03-31 NOTE — Addendum Note (Signed)
Addended by: Vincenza Hews on: 03/31/2020 01:56 PM   Modules accepted: Orders

## 2020-03-31 NOTE — Telephone Encounter (Signed)
Order in Epic for labs (blood & stool) patient said she would come in this afternoon for these. Let her know Dr. Russella Dar would like her to take Imodium- TID prn for the diarrhea, but to stop 2 days before starting prep for her colonoscopy. She agreed

## 2020-03-31 NOTE — Telephone Encounter (Signed)
Called patient back, and she states for the last 2 weeks she has been having liquid/yellow diarrhea within 2 hours of anything she eats or drinks. States she has been on the liquid & bland diet that Dr. Russella Dar recommended. She is also pushing fluids. Wants to know if there is anything else she can do, while waiting to have her Colonoscopy on 05/03/20

## 2020-03-31 NOTE — Telephone Encounter (Signed)
Stool GI profile and fecal elastase  IgA which was not resulted in October Imodium tid prn diarrhea for now and stop at least 2 days prior to colonoscopy prep

## 2020-03-31 NOTE — Addendum Note (Signed)
Addended by: Vincenza Hews on: 03/31/2020 01:57 PM   Modules accepted: Orders

## 2020-04-01 LAB — IGA: Immunoglobulin A: 336 mg/dL — ABNORMAL HIGH (ref 47–310)

## 2020-04-14 ENCOUNTER — Inpatient Hospital Stay: Payer: 59 | Admitting: Hematology and Oncology

## 2020-04-14 ENCOUNTER — Telehealth: Payer: Self-pay | Admitting: Hematology and Oncology

## 2020-04-14 ENCOUNTER — Inpatient Hospital Stay: Payer: 59

## 2020-04-14 NOTE — Telephone Encounter (Signed)
Rescheduled appointment per patient request. Patient was not feeling well and requested to reschedule. There was no availability with Dr. Leonides Schanz next week so I scheduled patient with Dr. Al Pimple.

## 2020-04-19 ENCOUNTER — Other Ambulatory Visit: Payer: Self-pay

## 2020-04-19 ENCOUNTER — Ambulatory Visit (AMBULATORY_SURGERY_CENTER): Payer: Self-pay | Admitting: *Deleted

## 2020-04-19 VITALS — Ht 62.0 in | Wt 112.0 lb

## 2020-04-19 DIAGNOSIS — Z01818 Encounter for other preprocedural examination: Secondary | ICD-10-CM

## 2020-04-19 DIAGNOSIS — R194 Change in bowel habit: Secondary | ICD-10-CM

## 2020-04-19 DIAGNOSIS — R1084 Generalized abdominal pain: Secondary | ICD-10-CM

## 2020-04-19 DIAGNOSIS — R197 Diarrhea, unspecified: Secondary | ICD-10-CM

## 2020-04-19 MED ORDER — SUPREP BOWEL PREP KIT 17.5-3.13-1.6 GM/177ML PO SOLN: 1.0000 | Freq: Once | ORAL | 0 refills | Status: AC

## 2020-04-19 NOTE — Progress Notes (Signed)
No egg or soy allergy known to patient  No issues with past sedation with any surgeries or procedures no intubation problems in the past  No FH of Malignant Hyperthermia No diet pills per patient No home 02 use per patient  No blood thinners per patient  Pt denies issues with constipation  No A fib or A flutter  EMMI video to pt or via MyChart  COVID 19 guidelines implemented in PV today with Pt and RN   cov test 12-13 Monday 11 am   Due to the COVID-19 pandemic we are asking patients to follow these guidelines. Please only bring one care partner. Please be aware that your care partner may wait in the car in the parking lot or if they feel like they will be too hot to wait in the car, they may wait in the lobby on the 4th floor. All care partners are required to wear a mask the entire time (we do not have any that we can provide them), they need to practice social distancing, and we will do a Covid check for all patient's and care partners when you arrive. Also we will check their temperature and your temperature. If the care partner waits in their car they need to stay in the parking lot the entire time and we will call them on their cell phone when the patient is ready for discharge so they can bring the car to the front of the building. Also all patient's will need to wear a mask into building.

## 2020-04-20 ENCOUNTER — Inpatient Hospital Stay: Payer: 59

## 2020-04-20 ENCOUNTER — Inpatient Hospital Stay: Payer: 59 | Attending: Hematology and Oncology | Admitting: Hematology and Oncology

## 2020-04-20 ENCOUNTER — Other Ambulatory Visit: Payer: Self-pay

## 2020-04-20 ENCOUNTER — Encounter: Payer: Self-pay | Admitting: Hematology and Oncology

## 2020-04-20 ENCOUNTER — Other Ambulatory Visit: Payer: Self-pay | Admitting: Pharmacist

## 2020-04-20 VITALS — BP 121/96 | HR 82 | Temp 98.1°F | Resp 20 | Ht 62.0 in | Wt 114.7 lb

## 2020-04-20 DIAGNOSIS — M329 Systemic lupus erythematosus, unspecified: Secondary | ICD-10-CM | POA: Insufficient documentation

## 2020-04-20 DIAGNOSIS — F319 Bipolar disorder, unspecified: Secondary | ICD-10-CM | POA: Diagnosis not present

## 2020-04-20 DIAGNOSIS — I10 Essential (primary) hypertension: Secondary | ICD-10-CM | POA: Diagnosis not present

## 2020-04-20 DIAGNOSIS — F17210 Nicotine dependence, cigarettes, uncomplicated: Secondary | ICD-10-CM | POA: Diagnosis not present

## 2020-04-20 DIAGNOSIS — E039 Hypothyroidism, unspecified: Secondary | ICD-10-CM | POA: Insufficient documentation

## 2020-04-20 DIAGNOSIS — D509 Iron deficiency anemia, unspecified: Secondary | ICD-10-CM | POA: Diagnosis present

## 2020-04-20 DIAGNOSIS — Z79899 Other long term (current) drug therapy: Secondary | ICD-10-CM | POA: Insufficient documentation

## 2020-04-20 NOTE — Progress Notes (Signed)
[facility] CONSULT NOTE  Patient Care Team: Pcp, No as PCP - General  CHIEF COMPLAINTS/PURPOSE OF CONSULTATION:  Iron deficiency anemia.  ASSESSMENT & PLAN:  No problem-specific Assessment & Plan notes found for this encounter.  No orders of the defined types were placed in this encounter.  This is a very pleasant 49 year old female patient with medical history significant for chronic abdominal issues, chronic iron deficiency anemia, bipolar disorder, chronic pain referred to hematology for evaluation and recommendations regarding her iron deficiency anemia. Patient is a good historian, tells me that she has always been iron deficient, cannot tolerate oral iron supplementation and got IV iron many years ago which she tolerated well. Pertinent review of systems include some fatigue but most of her symptoms are chronic abdominal symptoms, no ongoing hematochezia that she reports. Physical examination, frail appearing female patient, no palpable lymphadenopathy or hepatosplenomegaly, no abdominal findings, healed maculopapular rash noted on both legs which she attributes to recent COVID-19 infection. I have reviewed her labs, labs consistent with iron deficiency anemia, rest of the labs from 11/15 without any remarkable findings from hematology standpoint.  Given her intolerance to oral iron, I have recommended IV iron supplementation. Will give her ferraheme since she tolerated this well and she has lot of allergies. She is in agreement with this plan. RTC in 4-6 months, will repeat labs at that appointment. She was encouraged to send [place] a copy of colonoscopy report We discussed that there is increased risk of breast cancer with estrogen and testosterone supplementation, she mentioned she is aware and she is doing yearly mammograms.  Left her a voicemail recommending genetic testing given her mom's history of ovarian cancer. Gave her our phone number to call back and name of my  nurses to reach out to if she is interested in the referral.  Thank you for consulting [place] in the care of this patient.  Please not hesitate to contact us with any additional questions or concerns.  HISTORY OF PRESENTING ILLNESS:  Connie Murray 49 y.o. female is here because of IDA.  This is a very pleasant 49 yr female patient with PMH significant for HTN, dyslipidemia, abdominal problems for several years, previously treated for questionable Crohn's disease, bipolar disorder, chronic pain, chronic iron deficiency anemia referred to hematology for management of iron deficiency anemia.  Ms. Shamela arrived to the appointment today by herself.  She is a decent historian.  She tells me that she has always had iron deficiency, about 8 years ago, she had Feraheme because she did not respond to oral iron.  She tells me that she cannot tolerate oral iron.  She tells me that her main symptoms are her gut issues which she has had since the patient was born, she has had every symptom of Crohn's disease although the colonoscopy did not show changes consistent with Crohn's disease.  She tells me that she has tried Pentasa and steroids in the past which have been really helpful.  She is with her gastroenterologist for colonoscopy on 05/03/2020.  Besides that issues, she has some occasional shortness of breath which is not a daily issue.  No other B symptoms.  She has lost a lot of weight in the past but overall she is remained stable in the past few years. No change in urinary habits.  She denies any current blood loss in her stool, she says this hasn't happened recently. She denies any brittle nails or hair ice craving but she tells me  she hates water so she always eats ice chips.  No menstrual cycles, had hysterectomy.  No family history of hematological problems.  She eats healthy, eats mostly lean meat, fruits vegetables. Rest of the pertinent 10 point ROS reviewed, pertinent for her chronic conditions mostly  abdominal issues, occasional shortness of breath, otherwise reviewed and negative.  MEDICAL HISTORY:  Past Medical History:  Diagnosis Date   Allergy    Anxiety    Asthma    stress related, perfumes, dyes and certain flowers    Bipolar disorder (HCC)    Breast mass, right 12/30/2016   Cataract    removed with lens implants bilat    Cervical spondylosis    Chronic headaches    Chronic lower back pain    Degenerative arthritis    Depression    Dyslipidemia    Fibromyalgia    GERD (gastroesophageal reflux disease)    Heart murmur    cildhood heart murmur   Hyperlipidemia    Hypertension    Migraine    Myalgia and myositis, unspecified 11/27/2012   Normocytic anemia    Palpitations    Pneumonia    Radiculopathy    C8 or T1    SURGICAL HISTORY: Past Surgical History:  Procedure Laterality Date   ABDOMINAL HYSTERECTOMY     BREAST BIOPSY Right 01/19/2016   BREAST BIOPSY Right 01/19/2016   BREAST LUMPECTOMY WITH RADIOACTIVE SEED LOCALIZATION Right 12/30/2016   Procedure: RIGHT BREAST LUMPECTOMY WITH RADIOACTIVE SEED LOCALIZATION;  Surgeon: Claud Kelp, MD;  Location: [facility];  Service: General;  Laterality: Right;  ERAS PATHWAY   Broken Foot Right    COLONOSCOPY     dental     3 extractions February 2019   DENTAL RESTORATION/EXTRACTION WITH X-RAY     9 total   EYE SURGERY     bilateral cataract removal with lens placement June & July 2020   NASAL SINUS SURGERY     TOTAL ABDOMINAL HYSTERECTOMY W/ BILATERAL SALPINGOOPHORECTOMY  01/2010   UPPER GASTROINTESTINAL ENDOSCOPY     VENTRAL HERNIA REPAIR N/A 03/19/2019   Procedure: LAPAROSCOPIC INCARCERATED EPIGASTIRC HERNIA REPAIR With MESH;  Surgeon: Claud Kelp, MD;  Location: [facility] OR;  Service: General;  Laterality: N/A;    SOCIAL HISTORY: Social History   Socioeconomic History   Marital status: Single    Spouse name: Not on file   Number of children: 1    Years of education: Not on file   Highest education level: Not on file  Occupational History   Occupation: Disabled    Employer: DISABLED  Tobacco Use   Smoking status: Current Every Day Smoker    Packs/day: 1.00    Years: 28.00    Pack years: 28.00    Types: Cigarettes    Start date: 12/13/1982   Smokeless tobacco: Never Used   Tobacco comment: between 1/2 - 1 pack cigs per day  Vaping Use   Vaping Use: Former  Substance and Sexual Activity   Alcohol use: No   Drug use: No   Sexual activity: Not on file  Other Topics Concern   Not on file  Social History Narrative   Not on file   Social Determinants of Health   Financial Resource Strain:    Difficulty of Paying Living Expenses: Not on file  Food Insecurity:    Worried About Radiation protection practitioner of Food in the Last Year: Not on file   [employer] of Food in the Last Year: Not on file  Transportation Needs:    Freight forwarder (Medical): Not on file   Lack of Transportation (Non-Medical): Not on file  Physical Activity:    Days of Exercise per Week: Not on file   Minutes of Exercise per Session: Not on file  Stress:    Feeling of Stress : Not on file  Social Connections:    Frequency of Communication with Friends and Family: Not on file   Frequency of Social Gatherings with Friends and Family: Not on file   Attends Religious Services: Not on file   Active Member of Clubs or Organizations: Not on file   Attends Banker Meetings: Not on file   Marital Status: Not on file  Intimate Partner Violence:    Fear of Current or Ex-Partner: Not on file   Emotionally Abused: Not on file   Physically Abused: Not on file   Sexually Abused: Not on file    FAMILY HISTORY: Family History  Problem Relation Age of Onset   Ovarian cancer Mother    Colon polyps Mother    Alcoholism Father    Irritable bowel syndrome Other    Bone cancer Maternal Uncle    Breast cancer Paternal  Grandmother    Brain cancer Maternal Uncle    Colon cancer Neg Hx    Esophageal cancer Neg Hx    Pancreatic cancer Neg Hx    Rectal cancer Neg Hx    Stomach cancer Neg Hx    Prostate cancer Neg Hx     ALLERGIES:  is allergic to bee venom, cephalexin, clindamycin/lincomycin, molds & smuts, benadryl [diphenhydramine hcl], nsaids, statins, acetaminophen, codeine, darvocet [propoxyphene n-acetaminophen], latex, red dye, and tetracyclines & related.  MEDICATIONS:  Current Outpatient Medications  Medication Sig Dispense Refill   albuterol (PROVENTIL HFA;VENTOLIN HFA) 108 (90 BASE) MCG/ACT inhaler Inhale 2 puffs into the lungs every 6 (six) hours as needed for wheezing or shortness of breath. 8.5 g 3   ALPRAZolam (XANAX) 1 MG tablet Take 1 mg by mouth at bedtime as needed for anxiety.     B Complex-C (B-COMPLEX WITH VITAMIN C) tablet Take 2 tablets by mouth daily.      Bacillus Coagulans-Inulin (PROBIOTIC) 1-250 BILLION-MG CAPS Take by mouth.     Cholecalciferol (VITAMIN D PO) Take 1,000 Units by mouth daily.      cloNIDine (CATAPRES) 0.1 MG tablet Take 0.1 mg by mouth 4 (four) times daily.     cloNIDine (CATAPRES) 0.2 MG tablet Take 0.2 mg by mouth 2 (two) times daily.      Cyanocobalamin (VITAMIN B 12 PO) Take 3,000 mcg by mouth daily.     diclofenac (VOLTAREN) 75 MG EC tablet Take 75 mg by mouth 2 (two) times daily.     diclofenac Sodium (VOLTAREN) 1 % GEL diclofenac 1 % topical gel  APPLY TOPICALLY TO THE AFFECTED AREA FOUR TIMES DAILY AS NEEDED     EPINEPHrine (EPIPEN) 0.3 mg/0.3 mL SOAJ Inject 0.3 mLs (0.3 mg total) into the muscle once. 1 Device 5   estradiol (ESTRACE) 2 MG tablet Take 2 mg by mouth daily.     famotidine (PEPCID) 20 MG tablet Take by mouth.     fexofenadine (ALLEGRA) 180 MG tablet Take 180 mg by mouth daily.     Magnesium 500 MG CAPS Take 1,000 mg by mouth daily.      Melatonin 10 MG CAPS Take by mouth.     Multiple Vitamins-Minerals (ONE DAILY  MENS 50+ MULTIVIT PO) Take 1  tablet by mouth daily.     omeprazole (PRILOSEC) 40 MG capsule Take 1 capsule (40 mg total) by mouth 2 (two) times daily. 180 capsule 3   OnabotulinumtoxinA, Cosmetic, (BOTOX COSMETIC IM) Inject into the muscle every 3 (three) months.     ondansetron (ZOFRAN ODT) 4 MG disintegrating tablet Place one tablet under tongue 1 hour before colonoscopy prep and q 6 hours as needed. 10 tablet 0   SUMAtriptan (IMITREX) 100 MG tablet Take 100 mg by mouth every 2 (two) hours as needed for migraine.      SUMAtriptan (TOSYMRA) 10 MG/ACT SOLN Tosymra 10 mg/actuation nasal spray  USE 1 SPRAY NASALLY AS NEEDED FOR 30 DAYS     testosterone cypionate (DEPO-TESTOSTERONE) 100 MG/ML injection Inject 100 mg into the muscle every 8 (eight) weeks.     topiramate (TOPAMAX) 25 MG tablet Take 100 mg by mouth daily.     PAPAYA ENZYME PO Take 3 tablets by mouth daily. (Patient not taking: Reported on 04/19/2020)     prednisoLONE 5 MG TABS tablet Take 5 mg by mouth daily. (Patient not taking: Reported on 04/19/2020)     promethazine (PHENERGAN) 25 MG tablet Take 25 mg by mouth every 8 (eight) hours as needed for nausea or vomiting. (Patient not taking: Reported on 04/20/2020)     No current facility-administered medications for this visit.     PHYSICAL EXAMINATION:  ECOG PERFORMANCE STATUS: 0 - Asymptomatic from IDA stand point.  Vitals:   04/20/20 1516  BP: (!) 121/96  Pulse: 82  Resp: 20  Temp: 98.1 F (36.7 C)  SpO2: 98%   Filed Weights   04/20/20 1516  Weight: 114 lb 11.2 oz (52 kg)    GENERAL:alert, no distress and comfortable. Pt is talkative and is a good historian. SKIN: skin color, texture, turgor are normal, no rashes or significant lesions. Pallor noted NECK: supple, thyroid normal size, non-tender, without nodularity LYMPH:  no palpable lymphadenopathy in the cervical, axillary or inguinal LUNGS: clear to auscultation and percussion with normal breathing  effort HEART: regular rate & rhythm and no murmurs and no lower extremity edema ABDOMEN:abdomen soft, non-tender and normal bowel sounds Musculoskeletal:no cyanosis of digits and no clubbing  PSYCH: alert & oriented x 3 with fluent speech NEURO: no focal motor/sensory deficits Skin: Maculopapular rash noted both legs, she says this is the only symptom from COVID, healed.  LABORATORY DATA:  I have reviewed the data as listed Lab Results  Component Value Date   WBC 9.2 02/22/2020   HGB 10.8 (L) 02/22/2020   HCT 33.7 (L) 02/22/2020   MCV 77.2 (L) 02/22/2020   PLT 457.0 (H) 02/22/2020     Chemistry      Component Value Date/Time   NA 139 02/22/2020 1507   K 3.8 02/22/2020 1507   CL 107 02/22/2020 1507   CO2 22 02/22/2020 1507   BUN 16 02/22/2020 1507   CREATININE 1.11 02/22/2020 1507   CREATININE 1.41 (H) 03/09/2013 1444      Component Value Date/Time   CALCIUM 9.5 02/22/2020 1507   ALKPHOS 61 02/22/2020 1507   AST 16 02/22/2020 1507   ALT 13 02/22/2020 1507   BILITOT 0.3 02/22/2020 1507     I have reviewed pertinent labs.  Most recent labs showed Hb of 10, ferritin of 4, otherwise not pertinent.  RADIOGRAPHIC STUDIES: I have personally reviewed the radiological images as listed and agreed with the findings in the report. No results found.  All questions were  answered. The patient knows to call the clinic with any problems, questions or concerns. I have spent total of 45 minutes in the care of this patient including H and P, review of medical records, counseling and coordination of care.   Rachel Moulds, MD 04/20/2020 3:55 PM

## 2020-04-21 ENCOUNTER — Telehealth: Payer: Self-pay | Admitting: Hematology and Oncology

## 2020-04-21 NOTE — Telephone Encounter (Signed)
Scheduled appointments per 12/2 los. Called patient, no answer. Left message with appointments dates and times.

## 2020-04-24 ENCOUNTER — Ambulatory Visit: Payer: 59

## 2020-04-27 ENCOUNTER — Telehealth: Payer: Self-pay | Admitting: Gastroenterology

## 2020-04-27 MED ORDER — ONDANSETRON 4 MG PO TBDP: 4.0000 mg | ORAL_TABLET | Freq: Three times a day (TID) | ORAL | 1 refills | Status: DC | PRN

## 2020-04-27 NOTE — Telephone Encounter (Signed)
Patient reports for the last 3 weeks she has been feeling nauseous through out the day. Patient reports some days she cannot eat at all because she feels so sick to her stomach. Patient was prescribed promethazine by her Neurologist for her migraines but reports it does not work as well as the zofran. We have prescribed zofran for paient prior to her bowel prep. Patient states it worked really well. Patient requests we send in a prescription for zofran until her appt next week. Please advise Dr. Russella Dar.

## 2020-04-27 NOTE — Telephone Encounter (Signed)
Informed patient I sent her prescription to her pharmacy. Patient verbalized understanding.

## 2020-04-27 NOTE — Telephone Encounter (Signed)
Zofran 4 mg po tid prn nausea, vomiting, #60, 1 refill

## 2020-05-01 ENCOUNTER — Other Ambulatory Visit: Payer: Self-pay

## 2020-05-01 ENCOUNTER — Other Ambulatory Visit: Payer: Self-pay | Admitting: Gastroenterology

## 2020-05-01 ENCOUNTER — Inpatient Hospital Stay: Payer: 59

## 2020-05-01 VITALS — BP 118/70 | HR 86 | Temp 98.0°F | Resp 17

## 2020-05-01 DIAGNOSIS — D509 Iron deficiency anemia, unspecified: Secondary | ICD-10-CM | POA: Diagnosis not present

## 2020-05-01 LAB — SARS CORONAVIRUS 2 (TAT 6-24 HRS): SARS Coronavirus 2: NEGATIVE

## 2020-05-01 MED ORDER — LORATADINE 10 MG PO TABS
10.0000 mg | ORAL_TABLET | Freq: Once | ORAL | Status: AC
Start: 2020-05-01 — End: 2020-05-01
  Administered 2020-05-01: 15:00:00 10 mg via ORAL

## 2020-05-01 MED ORDER — FAMOTIDINE IN NACL 20-0.9 MG/50ML-% IV SOLN
INTRAVENOUS | Status: AC
  Filled 2020-05-01: qty 50

## 2020-05-01 MED ORDER — LORATADINE 10 MG PO TABS
ORAL_TABLET | ORAL | Status: AC
  Filled 2020-05-01: qty 1

## 2020-05-01 MED ORDER — METHYLPREDNISOLONE SODIUM SUCC 125 MG IJ SOLR
INTRAMUSCULAR | Status: AC
  Filled 2020-05-01: qty 2

## 2020-05-01 MED ORDER — METHYLPREDNISOLONE SODIUM SUCC 125 MG IJ SOLR
125.0000 mg | Freq: Once | INTRAMUSCULAR | Status: AC
Start: 2020-05-01 — End: 2020-05-01
  Administered 2020-05-01: 15:00:00 125 mg via INTRAVENOUS

## 2020-05-01 MED ORDER — FAMOTIDINE IN NACL 20-0.9 MG/50ML-% IV SOLN
20.0000 mg | Freq: Once | INTRAVENOUS | Status: AC
  Administered 2020-05-01: 15:00:00 20 mg via INTRAVENOUS

## 2020-05-01 MED ORDER — SODIUM CHLORIDE 0.9 % IV SOLN
510.0000 mg | Freq: Once | INTRAVENOUS | Status: AC
  Administered 2020-05-01: 16:00:00 510 mg via INTRAVENOUS
  Filled 2020-05-01: qty 510

## 2020-05-01 MED ORDER — SODIUM CHLORIDE 0.9 % IV SOLN
Freq: Once | INTRAVENOUS | Status: AC
  Filled 2020-05-01: qty 250

## 2020-05-01 NOTE — Patient Instructions (Signed)

## 2020-05-03 ENCOUNTER — Encounter: Payer: Self-pay | Admitting: Gastroenterology

## 2020-05-03 ENCOUNTER — Ambulatory Visit (AMBULATORY_SURGERY_CENTER): Payer: 59 | Admitting: Gastroenterology

## 2020-05-03 ENCOUNTER — Other Ambulatory Visit: Payer: Self-pay

## 2020-05-03 VITALS — BP 114/50 | HR 64 | Temp 98.6°F | Resp 7 | Ht 62.0 in | Wt 112.0 lb

## 2020-05-03 DIAGNOSIS — D509 Iron deficiency anemia, unspecified: Secondary | ICD-10-CM | POA: Diagnosis not present

## 2020-05-03 DIAGNOSIS — R1084 Generalized abdominal pain: Secondary | ICD-10-CM | POA: Diagnosis not present

## 2020-05-03 DIAGNOSIS — R194 Change in bowel habit: Secondary | ICD-10-CM | POA: Diagnosis not present

## 2020-05-03 MED ORDER — SODIUM CHLORIDE 0.9 % IV SOLN: 500.0000 mL | INTRAVENOUS | Status: DC

## 2020-05-03 NOTE — Progress Notes (Signed)
Pt's states no medical or surgical changes since previsit or office visit. Updated allergy and medication list per patient

## 2020-05-03 NOTE — Patient Instructions (Signed)
Your colonoscopy was normal.  You don't need another one for 10 years!  YOU HAD AN ENDOSCOPIC PROCEDURE TODAY AT THE [facility]:   Refer to the procedure report that was given to you for any specific questions about what was found during the examination.  If the procedure report does not answer your questions, please call your gastroenterologist to clarify.  If you requested that your care partner not be given the details of your procedure findings, then the procedure report has been included in a sealed envelope for you to review at your convenience later.  YOU SHOULD EXPECT: Some feelings of bloating in the abdomen. Passage of more gas than usual.  Walking can help get rid of the air that was put into your GI tract during the procedure and reduce the bloating. If you had a lower endoscopy (such as a colonoscopy or flexible sigmoidoscopy) you may notice spotting of blood in your stool or on the toilet paper. If you underwent a bowel prep for your procedure, you may not have a normal bowel movement for a few days.  Please Note:  You might notice some irritation and congestion in your nose or some drainage.  This is from the oxygen used during your procedure.  There is no need for concern and it should clear up in a day or so.  SYMPTOMS TO REPORT IMMEDIATELY:   Following lower endoscopy (colonoscopy or flexible sigmoidoscopy):  Excessive amounts of blood in the stool  Significant tenderness or worsening of abdominal pains  Swelling of the abdomen that is new, acute  Fever of 100F or higher  For urgent or emergent issues, a gastroenterologist can be reached at any hour by calling [phone]. Do not use MyChart messaging for urgent concerns.    DIET:  We do recommend a small meal at first, but then you may proceed to your regular diet.  Drink plenty of fluids but you should avoid alcoholic beverages for 24 hours.  ACTIVITY:  You should plan to take it easy for the rest of today  and you should NOT DRIVE or use heavy machinery until tomorrow (because of the sedation medicines used during the test).    FOLLOW UP: Our staff will call the number listed on your records 48-72 hours following your procedure to check on you and address any questions or concerns that you may have regarding the information given to you following your procedure. If we do not reach you, we will leave a message.  We will attempt to reach you two times.  During this call, we will ask if you have developed any symptoms of COVID 19. If you develop any symptoms (ie: fever, flu-like symptoms, shortness of breath, cough etc.) before then, please call [phone].  If you test positive for Covid 19 in the 2 weeks post procedure, please call and report this information to us.    If any biopsies were taken you will be contacted by phone or by letter within the next 1-3 weeks.  Please call us at [phone] if you have not heard about the biopsies in 3 weeks.    SIGNATURES/CONFIDENTIALITY: You and/or your care partner have signed paperwork which will be entered into your electronic medical record.  These signatures attest to the fact that that the information above on your After Visit Summary has been reviewed and is understood.  Full responsibility of the confidentiality of this discharge information lies with you and/or your care-partner. 

## 2020-05-03 NOTE — Progress Notes (Signed)
Report to PACU, RN, vss, BBS= Clear.  

## 2020-05-03 NOTE — Op Note (Signed)
[facility] Patient Name: Connie Murray Procedure Date: 05/03/2020 10:45 AM MRN: [id number] Endoscopist: Meryl Dare , MD Age: 49 Referring MD:  Date of Birth: 08/12/1970 Gender: Female Account #: [id number] Procedure:                Colonoscopy Indications:              Generalized abdominal pain, Unexplained iron                            deficiency anemia, Change in bowel habits Medicines:                Monitored Anesthesia Care Procedure:                Pre-Anesthesia Assessment:                           - Prior to the procedure, a History and Physical                            was performed, and patient medications and                            allergies were reviewed. The patient's tolerance of                            previous anesthesia was also reviewed. The risks                            and benefits of the procedure and the sedation                            options and risks were discussed with the patient.                            All questions were answered, and informed consent                            was obtained. Prior Anticoagulants: The patient has                            taken no previous anticoagulant or antiplatelet                            agents. ASA Grade Assessment: II - A patient with                            mild systemic disease. After reviewing the risks                            and benefits, the patient was deemed in                            satisfactory condition to undergo the procedure.  After obtaining informed consent, the colonoscope                            was passed under direct vision. Throughout the                            procedure, the patient's blood pressure, pulse, and                            oxygen saturations were monitored continuously. The                            Olympus PFC-[id number] (#[id number]) Colonoscope was                            introduced through the anus  and advanced to the the                            terminal ileum, with identification of the                            appendiceal orifice and IC valve. The terminal                            ileum, ileocecal valve, appendiceal orifice, and                            rectum were photographed. The quality of the bowel                            preparation was adequate. The colonoscopy was                            performed without difficulty. The patient tolerated                            the procedure well. Scope In: 10:55:34 AM Scope Out: 11:12:18 AM Scope Withdrawal Time: 0 hours 10 minutes 49 seconds  Total Procedure Duration: 0 hours 16 minutes 44 seconds  Findings:                 The perianal and digital rectal examinations were                            normal.                           The terminal ileum appeared normal.                           The entire examined colon appeared normal on direct                            and retroflexion views. Complications:            No immediate complications. Estimated blood loss:  None. Estimated Blood Loss:     Estimated blood loss: none. Impression:               - The examined portion of the ileum was normal.                           - The entire examined colon is normal on direct and                            retroflexion views.                           - No specimens collected. Recommendation:           - Repeat colonoscopy in 10 years for screening                            purposes.                           - Patient has a contact number available for                            emergencies. The signs and symptoms of potential                            delayed complications were discussed with the                            patient. Return to normal activities tomorrow.                            Written discharge instructions were provided to the                            patient.                            - Resume previous diet.                           - Continue present medications. Meryl Dare, MD 05/03/2020 11:15:41 AM This report has been signed electronically.

## 2020-05-05 ENCOUNTER — Telehealth: Payer: Self-pay | Admitting: *Deleted

## 2020-05-05 NOTE — Telephone Encounter (Signed)
°  Follow up Call-  Call back number 05/03/2020  Post procedure Call Back phone  # [phone]  Permission to leave phone message Yes  Some recent data might be hidden     Patient questions:  Do you have a fever, pain , or abdominal swelling? No. Pain Score  0 *  Have you tolerated food without any problems? Yes.    Have you been able to return to your normal activities? Yes.    Do you have any questions about your discharge instructions: Diet   No. Medications  No. Follow up visit  No.  Do you have questions or concerns about your Care? No.  Actions: * If pain score is 4 or above: No action needed, pain <4  1. Have you developed a fever since your procedure? NO  2.   Have you had an respiratory symptoms (SOB or cough) since your procedure? NO  3.   Have you tested positive for COVID 19 since your procedure NO  4.   Have you had any family members/close contacts diagnosed with the COVID 19 since your procedure?  NO   If yes to any of these questions please route to Laverna Peace, RN and Karlton Lemon, RN

## 2020-05-10 ENCOUNTER — Ambulatory Visit: Payer: 59

## 2020-05-29 ENCOUNTER — Other Ambulatory Visit: Payer: Self-pay

## 2020-05-29 ENCOUNTER — Encounter: Payer: Self-pay | Admitting: Physical Therapy

## 2020-05-29 ENCOUNTER — Ambulatory Visit: Payer: 59 | Attending: General Surgery | Admitting: Physical Therapy

## 2020-05-29 DIAGNOSIS — M6281 Muscle weakness (generalized): Secondary | ICD-10-CM

## 2020-05-29 DIAGNOSIS — R252 Cramp and spasm: Secondary | ICD-10-CM | POA: Diagnosis not present

## 2020-05-29 NOTE — Therapy (Signed)
[facility] [address], [address] [place], [place], [address] Phone: [phone]   Fax:  [phone]  Physical Therapy Evaluation  Patient Details  Name: Connie Murray MRN: [id number] Date of Birth: 1971-01-02 Referring Provider (PT): Gaynelle Adu, MD   Encounter Date: 05/29/2020   PT End of Session - 05/29/20 1516    Visit Number 1    Date for PT Re-Evaluation 07/24/20    PT Start Time 0948   incomplete assessment due to tardiness   PT Stop Time 1015    PT Time Calculation (min) 27 min    Activity Tolerance Patient tolerated treatment well    Behavior During Therapy [facility] for tasks assessed/performed           Past Medical History:  Diagnosis Date  . Allergy   . Anxiety   . Asthma    stress related, perfumes, dyes and certain flowers   . Bipolar disorder (HCC)   . Breast mass, right 12/30/2016  . Cataract    removed with lens implants bilat   . Cervical spondylosis   . Chronic headaches   . Chronic lower back pain   . Degenerative arthritis   . Depression   . Dyslipidemia   . Fibromyalgia   . GERD (gastroesophageal reflux disease)   . Heart murmur    cildhood heart murmur  . Hyperlipidemia   . Hypertension   . Migraine   . Myalgia and myositis, unspecified 11/27/2012  . Normocytic anemia   . Palpitations   . Pneumonia   . Radiculopathy    C8 or T1    Past Surgical History:  Procedure Laterality Date  . ABDOMINAL HYSTERECTOMY    . BREAST BIOPSY Right 01/19/2016  . BREAST BIOPSY Right 01/19/2016  . BREAST LUMPECTOMY WITH RADIOACTIVE SEED LOCALIZATION Right 12/30/2016   Procedure: RIGHT BREAST LUMPECTOMY WITH RADIOACTIVE SEED LOCALIZATION;  Surgeon: Claud Kelp, MD;  Location: [facility];  Service: General;  Laterality: Right;  [facility] PATHWAY  . Broken Foot Right   . COLONOSCOPY    . dental     3 extractions February 2019  . DENTAL RESTORATION/EXTRACTION WITH X-RAY     9 total  . EYE  SURGERY     bilateral cataract removal with lens placement June & July 2020  . NASAL SINUS SURGERY    . TOTAL ABDOMINAL HYSTERECTOMY W/ BILATERAL SALPINGOOPHORECTOMY  01/2010  . UPPER GASTROINTESTINAL ENDOSCOPY    . VENTRAL HERNIA REPAIR N/A 03/19/2019   Procedure: LAPAROSCOPIC INCARCERATED EPIGASTIRC HERNIA REPAIR With MESH;  Surgeon: Claud Kelp, MD;  Location: [facility];  Service: General;  Laterality: N/A;    There were no vitals filed for this visit.    Subjective Assessment - 05/29/20 0949    Subjective I developed pancreatitis last summer and then Covid august, then flue, then stomach virus.  After that I got a cough and was coughing so hard that I strained a muscle in my abdomen.  I am also sure I have crohn's but my doctor doesn't think so. It hurts just doing housework and I like to do a lot of things especially gardening is my passion.    Pertinent History hysterectomy; lumpectomy; ventral hernia repair; fibro; chronic back pain; constipation hx, GERD hx    Patient Stated Goals get strength program    Currently in Pain? No/denies              [facility] PT Assessment - 05/29/20 0001  Assessment   Medical Diagnosis abdominal muscle strain and SUI    Referring Provider (PT) Gaynelle Adu, MD    Onset Date/Surgical Date --   last summer   Prior Therapy NO      Precautions   Precautions None      Balance Screen   Has the patient fallen in the past 6 months No      Home Environment   Living Environment Private residence    Living Arrangements Children   daughter     Prior Function   Level of Independence Independent    Leisure garden, sew, paint      Cognition   Overall Cognitive Status Within Functional Limits for tasks assessed      Posture/Postural Control   Posture/Postural Control Postural limitations    Postural Limitations Rounded Shoulders;Increased thoracic kyphosis      ROM / Strength   AROM / PROM / Strength PROM;Strength      PROM   Overall PROM  Comments excessive hip IR; ER and flexion normal      Strength   Overall Strength Comments weakness of core Rt inguinal pain with ab MMT and decreased pain with pressure to inguinal region      Flexibility   Soft Tissue Assessment /Muscle Length yes    Hamstrings Rt hamstring 90%      Palpation   Palpation comment low thoracic and lumbar multifidi tight      Ambulation/Gait   Gait Pattern Decreased stride length                      Objective measurements completed on examination: See above findings.     Pelvic Floor Special Questions - 05/29/20 0001    Prior Pelvic/Prostate Exam Yes    Prior Pregnancies Yes    Number of Pregnancies 1    Number of Vaginal Deliveries 1    Urinary Leakage Yes    How often occasionally    Activities that cause leaking Coughing;Sneezing    Urinary urgency Yes    Fecal incontinence --   did for a month around colonoscopy   Skin Integrity --   dryness   Prolapse --   some noticed supine ant/poster   Pelvic Floor Internal Exam pt identity confirmed and internal assessment    Exam Type Vaginal    Palpation Rt levators tight more in posterior    Strength fair squeeze, definite lift    Strength # of reps --   4 quick flick - taking time and cues to relax   Strength # of seconds 6    Tone low                      PT Short Term Goals - 05/29/20 1534      PT SHORT TERM GOAL #1   Title ind with initial HEP    Time 4    Period Weeks    Status New    Target Date 06/26/20      PT SHORT TERM GOAL #2   Title Pt will report at least 25% reduction of pain    Time 4    Period Weeks    Status New    Target Date 06/26/20             PT Long Term Goals - 05/29/20 1514      PT LONG TERM GOAL #1   Title pt will be ind with HEP  Time 8    Period Weeks    Status New    Target Date 07/24/20      PT LONG TERM GOAL #2   Title Pt will report at least 50% less pain during normal daily activities    Time 8    Period  Weeks    Status New    Target Date 07/24/20      PT LONG TERM GOAL #3   Title Pt will report no leakage due to improve strength and coordination    Time 8    Period Weeks    Status New    Target Date 07/24/20                  Plan - 05/29/20 1520    Clinical Impression Statement Pt presents to skilled PT due to abdominal pain in lower Rt qudrant and inguinal region.  Pt has abdominal fascial restrictions and weakness with pain during MMT of core.  Pt has less discomfort with compression of Rt inguinal region . Pt has muscle tension of rectus abdominals.  Pt has tight hamstrings Rt>Lt.  Pt has hip ROM as mentioned above.  Of note there is decreased endurance of the pelvic floor and low tone . Unable to assess total LE and core strength due to pt arriving late and complex history.  Will benefit from more assessment next.  Pt will benefit from core strengthening and to address all above mentioned impairments for return to full functional and active lifestyle.    Personal Factors and Comorbidities Comorbidity 3+    Comorbidities hysterectomy; lumpectomy; ventral hernia repair; fibro; chronic back pain; constipation hx, GERD hx;    Examination-Activity Limitations Lift;Squat;Continence    Examination-Participation Restrictions Psychologist, forensic;Yard Work    Conservation officer, historic buildings Evolving/Moderate complexity    Clinical Decision Making Moderate    Rehab Potential Good    PT Frequency 1x / week    PT Duration 8 weeks    PT Treatment/Interventions ADLs/Self Care Home Management;Biofeedback;Cryotherapy;Electrical Stimulation;Moist Heat;Therapeutic activities;Neuromuscular re-education;Therapeutic exercise;Gait training;Patient/family education;Manual techniques;Taping;Passive range of motion;Dry needling    PT Next Visit Plan assess LE strength; core strength with pelvic floor coordination; intial HEP, h/s stretches    Consulted and Agree with Plan of Care Patient            Patient will benefit from skilled therapeutic intervention in order to improve the following deficits and impairments:  Abnormal gait,Pain,Postural dysfunction,Impaired flexibility,Increased fascial restricitons,Decreased strength,Decreased coordination,Decreased range of motion,Decreased endurance,Difficulty walking,Increased muscle spasms  Visit Diagnosis: Cramp and spasm  Muscle weakness (generalized)     Problem List Patient Active Problem List   Diagnosis Date Noted  . Incarcerated epigastric hernia 03/19/2019  . Breast mass, right 12/30/2016  . Back pain, chronic 12/09/2012  . Bipolar 1 disorder, mixed, moderate (HCC) 12/09/2012  . Asthma, chronic 12/09/2012  . Fibromyalgia 12/09/2012  . Migraine, unspecified, 12/09/2012  . Myalgia and myositis, unspecified 11/27/2012  . Ventral hernia-supraumbilical 09/23/2012  . IRON DEFICIENCY 04/18/2010  . UNSPECIFIED ANEMIA 04/18/2010  . BLOOD IN STOOL 04/18/2010  . GERD 01/06/2008  . CONSTIPATION 01/06/2008    Junious Silk, PT 05/29/2020, 3:35 PM   [facility] [address] [place], [place], [address] Phone: [phone]   Fax:  [phone]  Name: Connie Murray MRN: [id number] Date of Birth: May 17, 1971

## 2020-06-16 ENCOUNTER — Ambulatory Visit: Payer: 59 | Admitting: Physical Therapy

## 2020-06-16 ENCOUNTER — Other Ambulatory Visit: Payer: Self-pay

## 2020-06-16 DIAGNOSIS — M6281 Muscle weakness (generalized): Secondary | ICD-10-CM

## 2020-06-16 DIAGNOSIS — R252 Cramp and spasm: Secondary | ICD-10-CM | POA: Diagnosis not present

## 2020-06-16 NOTE — Patient Instructions (Signed)
Access Code: [email or web address] URL: [email or web address] Date: 06/16/2020 Prepared by: Dwana Curd  Exercises Supine Transversus Abdominis Bracing - Hands on Thighs - 1 x daily - 7 x weekly - 1 sets - 10 reps - 5 sec hold Standing Transverse Abdominis Contraction - 1 x daily - 7 x weekly - 3 sets - 10 reps

## 2020-06-16 NOTE — Therapy (Signed)
[facility] [address] [place], [place], [address] Phone: [phone]   Fax:  [phone]  Physical Therapy Treatment  Patient Details  Name: Connie Murray MRN: [id number] Date of Birth: 04/21/1971 Referring Provider (PT): Greer Pickerel, MD   Encounter Date: 06/16/2020   PT End of Session - 06/16/20 1202    Visit Number 2    Date for PT Re-Evaluation 07/24/20    PT Start Time 1111   arrived late   PT Stop Time 1150    PT Time Calculation (min) 39 min    Activity Tolerance Patient tolerated treatment well    Behavior During Therapy [facility] for tasks assessed/performed           Past Medical History:  Diagnosis Date  . Allergy   . Anxiety   . Asthma    stress related, perfumes, dyes and certain flowers   . Bipolar disorder ([place])   . Breast mass, right 12/30/2016  . Cataract    removed with lens implants bilat   . Cervical spondylosis   . Chronic headaches   . Chronic lower back pain   . Degenerative arthritis   . Depression   . Dyslipidemia   . Fibromyalgia   . GERD (gastroesophageal reflux disease)   . Heart murmur    cildhood heart murmur  . Hyperlipidemia   . Hypertension   . Migraine   . Myalgia and myositis, unspecified 11/27/2012  . Normocytic anemia   . Palpitations   . Pneumonia   . Radiculopathy    C8 or T1    Past Surgical History:  Procedure Laterality Date  . ABDOMINAL HYSTERECTOMY    . BREAST BIOPSY Right 01/19/2016  . BREAST BIOPSY Right 01/19/2016  . BREAST LUMPECTOMY WITH RADIOACTIVE SEED LOCALIZATION Right 12/30/2016   Procedure: RIGHT BREAST LUMPECTOMY WITH RADIOACTIVE SEED LOCALIZATION;  Surgeon: Fanny Skates, MD;  Location: [place];  Service: General;  Laterality: Right;  ERAS PATHWAY  . Broken Foot Right   . COLONOSCOPY    . dental     3 extractions February 2019  . DENTAL RESTORATION/EXTRACTION WITH X-RAY     9 total  . EYE SURGERY     bilateral  cataract removal with lens placement June & July 2020  . NASAL SINUS SURGERY    . TOTAL ABDOMINAL HYSTERECTOMY W/ BILATERAL SALPINGOOPHORECTOMY  01/2010  . UPPER GASTROINTESTINAL ENDOSCOPY    . VENTRAL HERNIA REPAIR N/A 03/19/2019   Procedure: LAPAROSCOPIC INCARCERATED [place] HERNIA REPAIR With MESH;  Surgeon: Fanny Skates, MD;  Location: [place];  Service: General;  Laterality: N/A;    There were no vitals filed for this visit.   Subjective Assessment - 06/16/20 1120    Subjective Pt states she isin more pain for the last couple of weeks. I was startled yesterday and had a big knot in my belly afterwards    Pertinent History hysterectomy; lumpectomy; ventral hernia repair; fibro; chronic back pain; constipation hx, GERD hx    Patient Stated Goals get strength program    Currently in Pain? Yes    Pain Score 5     Pain Location Abdomen    Pain Orientation Right;Lateral    Pain Descriptors / Indicators Tightness;Cramping    Pain Radiating Towards down side to the groin    Pain Frequency Intermittent    Aggravating Factors  coughing    Multiple Pain Sites No  [place] Adult PT Treatment/Exercise - 06/16/20 0001      Neuro Re-ed    Neuro Re-ed Details  transverse abs in supine hooklying with exhale and press on thighs - education and performed; diaphragmatic breathing educated and performed      Exercises   Exercises Lumbar      Manual Therapy   Manual Therapy Myofascial release    Myofascial Release diaphragm and rectus abdominus Rt>Lt                  PT Education - 06/16/20 1213    Education Details Access Code: [email or web address]    Person(s) Educated Patient    Methods Explanation;Demonstration;Tactile cues;Verbal cues;Handout    Comprehension Verbalized understanding;Returned demonstration            PT Short Term Goals - 05/29/20 1534      PT SHORT TERM GOAL #1   Title ind with initial HEP    Time 4    Period Weeks     Status New    Target Date 06/26/20      PT SHORT TERM GOAL #2   Title Pt will report at least 25% reduction of pain    Time 4    Period Weeks    Status New    Target Date 06/26/20             PT Long Term Goals - 05/29/20 1514      PT LONG TERM GOAL #1   Title pt will be ind with HEP    Time 8    Period Weeks    Status New    Target Date 07/24/20      PT LONG TERM GOAL #2   Title Pt will report at least 50% less pain during normal daily activities    Time 8    Period Weeks    Status New    Target Date 07/24/20      PT LONG TERM GOAL #3   Title Pt will report no leakage due to improve strength and coordination    Time 8    Period Weeks    Status New    Target Date 07/24/20                 Plan - 06/16/20 1203    Clinical Impression Statement Pt arrived late for treatment today.  Today's session focused on soft tissue and myofascial release with most restrictions found around ribcage.  Pt had to use pressure to Rt inguinal region when coughing during the session today.  Pt was also educated on transverse ab activation and breathing for deep core strengthening.  She tends to hold her breath but did well with cue to press on thighs.  Pt was given basic core strengthening today.  No goals met due to first follow up today.    PT Treatment/Interventions ADLs/Self Care Home Management;Biofeedback;Cryotherapy;Electrical Stimulation;Moist Heat;Therapeutic activities;Neuromuscular re-education;Therapeutic exercise;Gait training;Patient/family education;Manual techniques;Taping;Passive range of motion;Dry needling    PT Next Visit Plan progress core strength; assess LE strength standing SLS    PT Home Exercise Plan Access Code: [email or web address]    Consulted and Agree with Plan of Care Patient           Patient will benefit from skilled therapeutic intervention in order to improve the following deficits and impairments:  Abnormal gait,Pain,Postural dysfunction,Impaired  flexibility,Increased fascial restricitons,Decreased strength,Decreased coordination,Decreased range of motion,Decreased endurance,Difficulty walking,Increased muscle spasms  Visit Diagnosis: Cramp and spasm  Muscle weakness (generalized)  Problem List Patient Active Problem List   Diagnosis Date Noted  . Incarcerated epigastric hernia 03/19/2019  . Breast mass, right 12/30/2016  . Back pain, chronic 12/09/2012  . Bipolar 1 disorder, mixed, moderate ([place]) 12/09/2012  . Asthma, chronic 12/09/2012  . Fibromyalgia 12/09/2012  . Migraine, unspecified, 12/09/2012  . Myalgia and myositis, unspecified 11/27/2012  . Ventral hernia-supraumbilical 43/15/4008  . IRON DEFICIENCY 04/18/2010  . UNSPECIFIED ANEMIA 04/18/2010  . BLOOD IN STOOL 04/18/2010  . GERD 01/06/2008  . CONSTIPATION 01/06/2008    Jule Ser, PT 06/16/2020, 12:19 PM  [facility] [address], [place], [place], [address] Phone: [phone]   Fax:  [phone]  Name: Connie Murray MRN: [id number] Date of Birth: 1970-07-18

## 2020-06-23 ENCOUNTER — Other Ambulatory Visit: Payer: Self-pay | Admitting: Gastroenterology

## 2020-07-03 ENCOUNTER — Encounter: Payer: Self-pay | Admitting: Physical Therapy

## 2020-07-03 ENCOUNTER — Ambulatory Visit: Payer: 59 | Attending: General Surgery | Admitting: Physical Therapy

## 2020-07-03 ENCOUNTER — Other Ambulatory Visit: Payer: Self-pay

## 2020-07-03 DIAGNOSIS — M6281 Muscle weakness (generalized): Secondary | ICD-10-CM | POA: Insufficient documentation

## 2020-07-03 DIAGNOSIS — R252 Cramp and spasm: Secondary | ICD-10-CM | POA: Insufficient documentation

## 2020-07-03 NOTE — Therapy (Signed)
[facility] [address], [place], [place], [address] Phone: [phone]   Fax:  [phone]  Physical Therapy Treatment  Patient Details  Name: Connie Murray MRN: [id number] Date of Birth: 15-Feb-1971 Referring Provider (PT): Greer Pickerel, MD   Encounter Date: 07/03/2020   PT End of Session - 07/03/20 1413    Visit Number 3    Date for PT Re-Evaluation 07/24/20    PT Start Time 1410   arrived late   PT Stop Time 1450    PT Time Calculation (min) 40 min    Activity Tolerance Patient tolerated treatment well    Behavior During Therapy [facility] for tasks assessed/performed           Past Medical History:  Diagnosis Date  . Allergy   . Anxiety   . Asthma    stress related, perfumes, dyes and certain flowers   . Bipolar disorder ([place])   . Breast mass, right 12/30/2016  . Cataract    removed with lens implants bilat   . Cervical spondylosis   . Chronic headaches   . Chronic lower back pain   . Degenerative arthritis   . Depression   . Dyslipidemia   . Fibromyalgia   . GERD (gastroesophageal reflux disease)   . Heart murmur    cildhood heart murmur  . Hyperlipidemia   . Hypertension   . Migraine   . Myalgia and myositis, unspecified 11/27/2012  . Normocytic anemia   . Palpitations   . Pneumonia   . Radiculopathy    C8 or T1    Past Surgical History:  Procedure Laterality Date  . ABDOMINAL HYSTERECTOMY    . BREAST BIOPSY Right 01/19/2016  . BREAST BIOPSY Right 01/19/2016  . BREAST LUMPECTOMY WITH RADIOACTIVE SEED LOCALIZATION Right 12/30/2016   Procedure: RIGHT BREAST LUMPECTOMY WITH RADIOACTIVE SEED LOCALIZATION;  Surgeon: Fanny Skates, MD;  Location: [place];  Service: General;  Laterality: Right;  ERAS PATHWAY  . Broken Foot Right   . COLONOSCOPY    . dental     3 extractions February 2019  . DENTAL RESTORATION/EXTRACTION WITH X-RAY     9 total  . EYE SURGERY     bilateral  cataract removal with lens placement June & July 2020  . NASAL SINUS SURGERY    . TOTAL ABDOMINAL HYSTERECTOMY W/ BILATERAL SALPINGOOPHORECTOMY  01/2010  . UPPER GASTROINTESTINAL ENDOSCOPY    . VENTRAL HERNIA REPAIR N/A 03/19/2019   Procedure: LAPAROSCOPIC INCARCERATED [place] HERNIA REPAIR With MESH;  Surgeon: Fanny Skates, MD;  Location: [place];  Service: General;  Laterality: N/A;    There were no vitals filed for this visit.   Subjective Assessment - 07/03/20 1412    Subjective Pt states she is feeling better and the exercises are helping.  I am feeling 50% less pain.    Pertinent History hysterectomy; lumpectomy; ventral hernia repair; fibro; chronic back pain; constipation hx, GERD hx    Patient Stated Goals get strength program    Currently in Pain? No/denies                             [facility] Adult PT Treatment/Exercise - 07/03/20 0001      Lumbar Exercises: Stretches   Active Hamstring Stretch Right;Left;3 reps    Hip Flexor Stretch Right;Left;3 reps      Lumbar Exercises: Standing   Other Standing Lumbar Exercises SLS on half foam  and stand on foam mat - LE kick 3 ways      Lumbar Exercises: Seated   Other Seated Lumbar Exercises LAQ with ball squeeze in chair - 20x          hip hinge with cane - cues to keep spine neutral          PT Short Term Goals - 07/03/20 1423      PT SHORT TERM GOAL #1   Title ind with initial HEP    Status Achieved      PT SHORT TERM GOAL #2   Title Pt will report at least 25% reduction of pain    Baseline definitely 50%    Status Achieved             PT Long Term Goals - 07/03/20 1424      PT LONG TERM GOAL #1   Title pt will be ind with HEP    Status On-going      PT LONG TERM GOAL #2   Title Pt will report at least 50% less pain during normal daily activities    Baseline at least 50%    Status Achieved      PT LONG TERM GOAL #3   Title Pt will report no leakage due to improve strength and  coordination    Status On-going                 Plan - 07/03/20 1450    Clinical Impression Statement Pt did well with exercise progression today. She has met original pain goal of at least 50% reduction in pain and she is able to do more core strengthening.  Pt needs cues to keep neutral during squats and did better with cues from the cane.  Pt will benefit from skilled PT to continue working on functional strength.    PT Treatment/Interventions ADLs/Self Care Home Management;Biofeedback;Cryotherapy;Electrical Stimulation;Moist Heat;Therapeutic activities;Neuromuscular re-education;Therapeutic exercise;Gait training;Patient/family education;Manual techniques;Taping;Passive range of motion;Dry needling    PT Next Visit Plan balance and review hip hinge, add squat progression with kegel and eccentric squat    PT Home Exercise Plan Access Code: [email or web address]    Consulted and Agree with Plan of Care Patient           Patient will benefit from skilled therapeutic intervention in order to improve the following deficits and impairments:  Abnormal gait,Pain,Postural dysfunction,Impaired flexibility,Increased fascial restricitons,Decreased strength,Decreased coordination,Decreased range of motion,Decreased endurance,Difficulty walking,Increased muscle spasms  Visit Diagnosis: Cramp and spasm  Muscle weakness (generalized)     Problem List Patient Active Problem List   Diagnosis Date Noted  . Incarcerated epigastric hernia 03/19/2019  . Breast mass, right 12/30/2016  . Back pain, chronic 12/09/2012  . Bipolar 1 disorder, mixed, moderate ([place]) 12/09/2012  . Asthma, chronic 12/09/2012  . Fibromyalgia 12/09/2012  . Migraine, unspecified, 12/09/2012  . Myalgia and myositis, unspecified 11/27/2012  . Ventral hernia-supraumbilical 36/46/8032  . IRON DEFICIENCY 04/18/2010  . UNSPECIFIED ANEMIA 04/18/2010  . BLOOD IN STOOL 04/18/2010  . GERD 01/06/2008  . CONSTIPATION 01/06/2008     Jule Ser, PT 07/03/2020, 4:41 PM  [facility] [address], [place], [place], [address] Phone: [phone]   Fax:  [phone]  Name: KIOSHA BUCHAN MRN: [id number] Date of Birth: 1970-06-06

## 2020-07-10 ENCOUNTER — Other Ambulatory Visit: Payer: Self-pay

## 2020-07-10 ENCOUNTER — Ambulatory Visit: Payer: 59 | Admitting: Physical Therapy

## 2020-07-10 ENCOUNTER — Encounter: Payer: Self-pay | Admitting: Physical Therapy

## 2020-07-10 DIAGNOSIS — M6281 Muscle weakness (generalized): Secondary | ICD-10-CM

## 2020-07-10 DIAGNOSIS — R252 Cramp and spasm: Secondary | ICD-10-CM | POA: Diagnosis not present

## 2020-07-10 NOTE — Therapy (Signed)
[facility] [address], [address] [place], [place], [address] Phone: [phone]   Fax:  [phone]  Physical Therapy Treatment  Patient Details  Name: Connie Murray MRN: [id number] Date of Birth: May 31, 1970 Referring Provider (PT): Gaynelle Adu, MD   Encounter Date: 07/10/2020   PT End of Session - 07/10/20 1020    Visit Number 4    Date for PT Re-Evaluation 07/24/20    PT Start Time 1012    PT Stop Time 1052    PT Time Calculation (min) 40 min    Activity Tolerance Patient tolerated treatment well    Behavior During Therapy [facility] for tasks assessed/performed           Past Medical History:  Diagnosis Date  . Allergy   . Anxiety   . Asthma    stress related, perfumes, dyes and certain flowers   . Bipolar disorder ([facility])   . Breast mass, right 12/30/2016  . Cataract    removed with lens implants bilat   . Cervical spondylosis   . Chronic headaches   . Chronic lower back pain   . Degenerative arthritis   . Depression   . Dyslipidemia   . Fibromyalgia   . GERD (gastroesophageal reflux disease)   . Heart murmur    cildhood heart murmur  . Hyperlipidemia   . Hypertension   . Migraine   . Myalgia and myositis, unspecified 11/27/2012  . Normocytic anemia   . Palpitations   . Pneumonia   . Radiculopathy    C8 or T1    Past Surgical History:  Procedure Laterality Date  . ABDOMINAL HYSTERECTOMY    . BREAST BIOPSY Right 01/19/2016  . BREAST BIOPSY Right 01/19/2016  . BREAST LUMPECTOMY WITH RADIOACTIVE SEED LOCALIZATION Right 12/30/2016   Procedure: RIGHT BREAST LUMPECTOMY WITH RADIOACTIVE SEED LOCALIZATION;  Surgeon: Claud Kelp, MD;  [facility];  Service: General;  Laterality: Right;  ERAS PATHWAY  . Broken Foot Right   . COLONOSCOPY    . dental     3 extractions February 2019  . DENTAL RESTORATION/EXTRACTION WITH X-RAY     9 total  . EYE SURGERY     bilateral cataract removal  with lens placement June & July 2020  . NASAL SINUS SURGERY    . TOTAL ABDOMINAL HYSTERECTOMY W/ BILATERAL SALPINGOOPHORECTOMY  01/2010  . UPPER GASTROINTESTINAL ENDOSCOPY    . VENTRAL HERNIA REPAIR N/A 03/19/2019   Procedure: LAPAROSCOPIC INCARCERATED [place] HERNIA REPAIR With MESH;  Surgeon: Claud Kelp, MD;  Location: [facility];  Service: General;  Laterality: N/A;    There were no vitals filed for this visit.   Subjective Assessment - 07/10/20 1016    Subjective Pt states she felt sore after last time but it didn't hurt long.  Pt states she was cleaning a lot yesterday and had some pain but is feeling better now.  I don't have leakage and the ugency has almost completely gone away.  I have been going a normal amount and not going with just a small amount of urine in my bladder    Pertinent History hysterectomy; lumpectomy; ventral hernia repair; fibro; chronic back pain; constipation hx, GERD hx    Patient Stated Goals get strength program    Currently in Pain? No/denies                             [facility] Adult PT Treatment/Exercise -  07/10/20 0001      Lumbar Exercises: Stretches   Active Hamstring Stretch Right;Left;3 reps    Double Knee to Chest Stretch 3 reps;20 seconds    Figure 4 Stretch 3 reps;20 seconds      Lumbar Exercises: Standing   Functional Squats 15 reps    Functional Squats Limitations holding 10lb at chest    Lifting 15 reps   5 lb dead lift   Other Standing Lumbar Exercises SLS with 2 lb ankle weights hip abduction 15x    Other Standing Lumbar Exercises side steps and stepping back with blue loop - 15x each      Lumbar Exercises: Supine   Clam 15 reps   blue loop   Bridge 15 reps   blue loop                   PT Short Term Goals - 07/03/20 1423      PT SHORT TERM GOAL #1   Title ind with initial HEP    Status Achieved      PT SHORT TERM GOAL #2   Title Pt will report at least 25% reduction of pain    Baseline  definitely 50%    Status Achieved             PT Long Term Goals - 07/10/20 1022      PT LONG TERM GOAL #1   Title pt will be ind with HEP    Status On-going      PT LONG TERM GOAL #2   Title Pt will report at least 50% less pain during normal daily activities    Baseline 75% better    Status Achieved      PT LONG TERM GOAL #3   Title Pt will report no leakage due to improve strength and coordination                 Plan - 07/10/20 1024    Clinical Impression Statement Pt has almost completely resolved urgency and has not had any leakge.  Pt is doing great with exercises as well and has been doing well with managing pain at 75% improved overall.  Pt able to progress her exercises today with weights and more challenge to core and hip stabilizers using foam mat.  Pt needs cues for posture and correct alignment during exercises.  Continue to owrk on POC to ensure she is strong enough to maintain correct posture and body mechanics during functional activities for reduced risk of pain and injury.    PT Treatment/Interventions ADLs/Self Care Home Management;Biofeedback;Cryotherapy;Electrical Stimulation;Moist Heat;Therapeutic activities;Neuromuscular re-education;Therapeutic exercise;Gait training;Patient/family education;Manual techniques;Taping;Passive range of motion;Dry needling    PT Next Visit Plan progress balance and hip hinge/squat progression with kegel and eccentric squat    PT Home Exercise Plan Access Code: [email or web address]    Consulted and Agree with Plan of Care Patient           Patient will benefit from skilled therapeutic intervention in order to improve the following deficits and impairments:  Abnormal gait,Pain,Postural dysfunction,Impaired flexibility,Increased fascial restricitons,Decreased strength,Decreased coordination,Decreased range of motion,Decreased endurance,Difficulty walking,Increased muscle spasms  Visit Diagnosis: Cramp and spasm  Muscle weakness  (generalized)     Problem List Patient Active Problem List   Diagnosis Date Noted  . Incarcerated epigastric hernia 03/19/2019  . Breast mass, right 12/30/2016  . Back pain, chronic 12/09/2012  . Bipolar 1 disorder, mixed, moderate ([facility]) 12/09/2012  . Asthma, chronic 12/09/2012  . Fibromyalgia 12/09/2012  .  Migraine, unspecified, 12/09/2012  . Myalgia and myositis, unspecified 11/27/2012  . Ventral hernia-supraumbilical 09/23/2012  . IRON DEFICIENCY 04/18/2010  . UNSPECIFIED ANEMIA 04/18/2010  . BLOOD IN STOOL 04/18/2010  . GERD 01/06/2008  . CONSTIPATION 01/06/2008    Junious Silk, PT 07/10/2020, 11:01 AM  [facility] [address], [address] [place], [place], [address] Phone: [phone]   Fax:  [phone]  Name: Connie Murray MRN: [id number] Date of Birth: 04/30/1971

## 2020-07-10 NOTE — Therapy (Signed)
[facility] [address] [place], [place], [address] Phone: [phone]   Fax:  [phone]  Physical Therapy Treatment  Patient Details  Name: Connie Murray MRN: [id number] Date of Birth: 1971/05/05 Referring Provider (PT): Gaynelle Adu, MD   Encounter Date: 07/10/2020   PT End of Session - 07/10/20 1020    Visit Number 4    Date for PT Re-Evaluation 07/24/20    PT Start Time 1012    PT Stop Time 1052    PT Time Calculation (min) 40 min    Activity Tolerance Patient tolerated treatment well    Behavior During Therapy [facility] for tasks assessed/performed           Past Medical History:  Diagnosis Date  . Allergy   . Anxiety   . Asthma    stress related, perfumes, dyes and certain flowers   . Bipolar disorder (HCC)   . Breast mass, right 12/30/2016  . Cataract    removed with lens implants bilat   . Cervical spondylosis   . Chronic headaches   . Chronic lower back pain   . Degenerative arthritis   . Depression   . Dyslipidemia   . Fibromyalgia   . GERD (gastroesophageal reflux disease)   . Heart murmur    cildhood heart murmur  . Hyperlipidemia   . Hypertension   . Migraine   . Myalgia and myositis, unspecified 11/27/2012  . Normocytic anemia   . Palpitations   . Pneumonia   . Radiculopathy    C8 or T1    Past Surgical History:  Procedure Laterality Date  . ABDOMINAL HYSTERECTOMY    . BREAST BIOPSY Right 01/19/2016  . BREAST BIOPSY Right 01/19/2016  . BREAST LUMPECTOMY WITH RADIOACTIVE SEED LOCALIZATION Right 12/30/2016   Procedure: RIGHT BREAST LUMPECTOMY WITH RADIOACTIVE SEED LOCALIZATION;  Surgeon: Claud Kelp, MD;  Location: [facility];  Service: General;  Laterality: Right;  ERAS PATHWAY  . Broken Foot Right   . COLONOSCOPY    . dental     3 extractions February 2019  . DENTAL RESTORATION/EXTRACTION WITH X-RAY     9 total  . EYE SURGERY     bilateral cataract removal  with lens placement June & July 2020  . NASAL SINUS SURGERY    . TOTAL ABDOMINAL HYSTERECTOMY W/ BILATERAL SALPINGOOPHORECTOMY  01/2010  . UPPER GASTROINTESTINAL ENDOSCOPY    . VENTRAL HERNIA REPAIR N/A 03/19/2019   Procedure: LAPAROSCOPIC INCARCERATED EPIGASTIRC HERNIA REPAIR With MESH;  Surgeon: Claud Kelp, MD;  Location: [facility];  Service: General;  Laterality: N/A;    There were no vitals filed for this visit.   Subjective Assessment - 07/10/20 1016    Subjective Pt states she felt sore after last time but it didn't hurt long.  Pt states she was cleaning a lot yesterday and had some pain but is feeling better now.  I don't have leakage and the ugency has almost completely gone away.  I have been going a normal amount and not going with just a small amount of urine in my bladder    Pertinent History hysterectomy; lumpectomy; ventral hernia repair; fibro; chronic back pain; constipation hx, GERD hx    Patient Stated Goals get strength program    Currently in Pain? No/denies                             [facility] Adult PT Treatment/Exercise -  07/10/20 0001      Lumbar Exercises: Stretches   Active Hamstring Stretch Right;Left;3 reps    Double Knee to Chest Stretch 3 reps;20 seconds    Figure 4 Stretch 3 reps;20 seconds      Lumbar Exercises: Standing   Functional Squats 15 reps    Functional Squats Limitations holding 10lb at chest    Lifting 15 reps   5 lb dead lift   Other Standing Lumbar Exercises SLS with 2 lb ankle weights hip abduction 15x    Other Standing Lumbar Exercises side steps and stepping back with blue loop - 15x each      Lumbar Exercises: Supine   Clam 15 reps   blue loop   Bridge 15 reps   blue loop                   PT Short Term Goals - 07/03/20 1423      PT SHORT TERM GOAL #1   Title ind with initial HEP    Status Achieved      PT SHORT TERM GOAL #2   Title Pt will report at least 25% reduction of pain    Baseline  definitely 50%    Status Achieved             PT Long Term Goals - 07/10/20 1022      PT LONG TERM GOAL #1   Title pt will be ind with HEP    Status On-going      PT LONG TERM GOAL #2   Title Pt will report at least 50% less pain during normal daily activities    Baseline 75% better    Status Achieved      PT LONG TERM GOAL #3   Title Pt will report no leakage due to improve strength and coordination                 Plan - 07/10/20 1024    Clinical Impression Statement Pt has almost completely resolved urgency and has not had any leakge.  Pt is doing great with exercises as well and has been doing well with managing pain at 75% improved overall.  Pt able to progress her exercises today with weights and more challenge to core and hip stabilizers using foam mat.  Pt needs cues for posture and correct alignment during exercises.  Continue to owrk on POC to ensure she is strong enough to maintain correct posture and body mechanics during functional activities for reduced risk of pain and injury.    PT Treatment/Interventions ADLs/Self Care Home Management;Biofeedback;Cryotherapy;Electrical Stimulation;Moist Heat;Therapeutic activities;Neuromuscular re-education;Therapeutic exercise;Gait training;Patient/family education;Manual techniques;Taping;Passive range of motion;Dry needling    PT Next Visit Plan add bird dips; progress balance and hip hinge/squat progression with kegel and eccentric squat    PT Home Exercise Plan Access Code: [email or web address]    Consulted and Agree with Plan of Care Patient           Patient will benefit from skilled therapeutic intervention in order to improve the following deficits and impairments:  Abnormal gait,Pain,Postural dysfunction,Impaired flexibility,Increased fascial restricitons,Decreased strength,Decreased coordination,Decreased range of motion,Decreased endurance,Difficulty walking,Increased muscle spasms  Visit Diagnosis: Cramp and  spasm  Muscle weakness (generalized)     Problem List Patient Active Problem List   Diagnosis Date Noted  . Incarcerated epigastric hernia 03/19/2019  . Breast mass, right 12/30/2016  . Back pain, chronic 12/09/2012  . Bipolar 1 disorder, mixed, moderate (HCC) 12/09/2012  . Asthma, chronic 12/09/2012  .  Fibromyalgia 12/09/2012  . Migraine, unspecified, 12/09/2012  . Myalgia and myositis, unspecified 11/27/2012  . Ventral hernia-supraumbilical 09/23/2012  . IRON DEFICIENCY 04/18/2010  . UNSPECIFIED ANEMIA 04/18/2010  . BLOOD IN STOOL 04/18/2010  . GERD 01/06/2008  . CONSTIPATION 01/06/2008    Junious Silk, PT 07/10/2020, 11:08 AM  [facility] [address] [place], [place], [address] Phone: [phone]   Fax:  [phone]  Name: MILITZA DEVERY MRN: [id number] Date of Birth: Nov 28, 1970

## 2020-07-11 ENCOUNTER — Other Ambulatory Visit: Payer: Self-pay | Admitting: Obstetrics & Gynecology

## 2020-07-11 DIAGNOSIS — Z1231 Encounter for screening mammogram for malignant neoplasm of breast: Secondary | ICD-10-CM

## 2020-07-17 ENCOUNTER — Encounter: Payer: 59 | Admitting: Physical Therapy

## 2020-07-17 ENCOUNTER — Encounter: Payer: Self-pay | Admitting: Physical Therapy

## 2020-07-17 ENCOUNTER — Other Ambulatory Visit: Payer: Self-pay

## 2020-07-17 ENCOUNTER — Ambulatory Visit: Payer: 59 | Admitting: Physical Therapy

## 2020-07-17 DIAGNOSIS — R252 Cramp and spasm: Secondary | ICD-10-CM | POA: Diagnosis not present

## 2020-07-17 DIAGNOSIS — M6281 Muscle weakness (generalized): Secondary | ICD-10-CM

## 2020-07-17 NOTE — Therapy (Signed)
[facility] [address] [place], [place], [address] Phone: [phone]   Fax:  [phone]  Physical Therapy Treatment  Patient Details  Name: Connie Murray MRN: [id number] Date of Birth: [birth year] Referring Provider (PT): Greer Pickerel, MD   Encounter Date: 07/17/2020   PT End of Session - 07/17/20 1433    Visit Number 5    Date for PT Re-Evaluation 07/24/20    PT Start Time 1404    PT Stop Time 1442    PT Time Calculation (min) 38 min    Activity Tolerance Patient tolerated treatment well    Behavior During Therapy [facility] for tasks assessed/performed           Past Medical History:  Diagnosis Date  . Allergy   . Anxiety   . Asthma    stress related, perfumes, dyes and certain flowers   . Bipolar disorder ([place])   . Breast mass, right 12/30/2016  . Cataract    removed with lens implants bilat   . Cervical spondylosis   . Chronic headaches   . Chronic lower back pain   . Degenerative arthritis   . Depression   . Dyslipidemia   . Fibromyalgia   . GERD (gastroesophageal reflux disease)   . Heart murmur    cildhood heart murmur  . Hyperlipidemia   . Hypertension   . Migraine   . Myalgia and myositis, unspecified 11/27/2012  . Normocytic anemia   . Palpitations   . Pneumonia   . Radiculopathy    C8 or T1    Past Surgical History:  Procedure Laterality Date  . ABDOMINAL HYSTERECTOMY    . BREAST BIOPSY Right 01/19/2016  . BREAST BIOPSY Right 01/19/2016  . BREAST LUMPECTOMY WITH RADIOACTIVE SEED LOCALIZATION Right 12/30/2016   Procedure: RIGHT BREAST LUMPECTOMY WITH RADIOACTIVE SEED LOCALIZATION;  Surgeon: Fanny Skates, MD;  Location: [place];  Service: General;  Laterality: Right;  ERAS PATHWAY  . Broken Foot Right   . COLONOSCOPY    . dental     3 extractions February 2019  . DENTAL RESTORATION/EXTRACTION WITH X-RAY     9 total  . EYE SURGERY     bilateral cataract removal  with lens placement June & July 2020  . NASAL SINUS SURGERY    . TOTAL ABDOMINAL HYSTERECTOMY W/ BILATERAL SALPINGOOPHORECTOMY  01/2010  . UPPER GASTROINTESTINAL ENDOSCOPY    . VENTRAL HERNIA REPAIR N/A 03/19/2019   Procedure: LAPAROSCOPIC INCARCERATED [place] HERNIA REPAIR With MESH;  Surgeon: Fanny Skates, MD;  Location: [facility];  Service: General;  Laterality: N/A;    There were no vitals filed for this visit.   Subjective Assessment - 07/17/20 1409    Subjective Pt states she is still feeling good and feels good with all the home exercises.    Patient Stated Goals get strength program    Currently in Pain? No/denies                             [facility] Adult PT Treatment/Exercise - 07/17/20 0001      Lumbar Exercises: Standing   Other Standing Lumbar Exercises SLS on foam hip abduciton and on level surface bird dips    Other Standing Lumbar Exercises side steps, monster walk and stepping back with blue loop - 15x each      Lumbar Exercises: Quadruped   Opposite Arm/Leg Raise Right arm/Left leg;Left arm/Right leg;10 reps  SLR - 10x each Bridge with abduction - 20x blue loop       PT Education - 07/17/20 1433    Education Details Access Code: [email or web address]    Person(s) Educated Patient    Methods Explanation;Demonstration;Tactile cues;Verbal cues;Handout    Comprehension Verbalized understanding;Returned demonstration            PT Short Term Goals - 07/03/20 1423      PT SHORT TERM GOAL #1   Title ind with initial HEP    Status Achieved      PT SHORT TERM GOAL #2   Title Pt will report at least 25% reduction of pain    Baseline definitely 50%    Status Achieved             PT Long Term Goals - 07/17/20 1425      PT LONG TERM GOAL #1   Title pt will be ind with HEP    Status Achieved      PT LONG TERM GOAL #2   Title Pt will report at least 50% less pain during normal daily activities    Baseline 75% better    Status  Achieved      PT LONG TERM GOAL #3   Title Pt will report no leakage due to improve strength and coordination    Baseline no leakage and not having to go all the time    Status Achieved                 Plan - 07/17/20 1434    Clinical Impression Statement Pt has met all goals and is doing well with exercises.  HEP was updated today to include more advanced core and gluteal exercises so patient can return to full activity level    PT Treatment/Interventions ADLs/Self Care Home Management;Biofeedback;Cryotherapy;Electrical Stimulation;Moist Heat;Therapeutic activities;Neuromuscular re-education;Therapeutic exercise;Gait training;Patient/family education;Manual techniques;Taping;Passive range of motion;Dry needling    PT Next Visit Plan d/c today    PT Home Exercise Plan Access Code: [email or web address]    Consulted and Agree with Plan of Care Patient           Patient will benefit from skilled therapeutic intervention in order to improve the following deficits and impairments:  Abnormal gait,Pain,Postural dysfunction,Impaired flexibility,Increased fascial restricitons,Decreased strength,Decreased coordination,Decreased range of motion,Decreased endurance,Difficulty walking,Increased muscle spasms  Visit Diagnosis: Cramp and spasm  Muscle weakness (generalized)     Problem List Patient Active Problem List   Diagnosis Date Noted  . Incarcerated epigastric hernia 03/19/2019  . Breast mass, right 12/30/2016  . Back pain, chronic 12/09/2012  . Bipolar 1 disorder, mixed, moderate ([place]) 12/09/2012  . Asthma, chronic 12/09/2012  . Fibromyalgia 12/09/2012  . Migraine, unspecified, 12/09/2012  . Myalgia and myositis, unspecified 11/27/2012  . Ventral hernia-supraumbilical 76/54/6503  . IRON DEFICIENCY 04/18/2010  . UNSPECIFIED ANEMIA 04/18/2010  . BLOOD IN STOOL 04/18/2010  . GERD 01/06/2008  . CONSTIPATION 01/06/2008    Jule Ser, PT 07/17/2020, 2:47 PM  [facility] [address], [place], [place], [address] Phone: [phone]   Fax:  [phone]  Name: Connie Murray MRN: [id number] Date of Birth: 1970/10/17  PHYSICAL THERAPY DISCHARGE SUMMARY  Visits from Start of Care: 5  Current functional level related to goals / functional outcomes: See above goals met   Remaining deficits: See above   Education / Equipment: HEP  Plan: Patient agrees to discharge.  Patient goals were met. Patient is being discharged due to meeting  the stated rehab goals.  ?????     [employer], PT 07/17/20 2:49 PM

## 2020-07-17 NOTE — Patient Instructions (Signed)
Access Code: [email or web address] URL: [email or web address] Date: 07/17/2020 Prepared by: Dwana Curd  Exercises Supine Transversus Abdominis Bracing - Hands on Thighs - 1 x daily - 7 x weekly - 1 sets - 10 reps - 5 sec hold Standing Transverse Abdominis Contraction - 1 x daily - 7 x weekly - 3 sets - 10 reps Side Stepping with Resistance at Ankles - 1 x daily - 7 x weekly - 3 sets - 10 reps Band Walks - 1 x daily - 7 x weekly - 3 sets - 10 reps Warrior III - 1 x daily - 7 x weekly - 3 sets - 10 reps

## 2020-07-24 ENCOUNTER — Encounter: Payer: 59 | Admitting: Physical Therapy

## 2020-07-31 ENCOUNTER — Encounter: Payer: 59 | Admitting: Physical Therapy

## 2020-08-07 ENCOUNTER — Encounter: Payer: 59 | Admitting: Physical Therapy

## 2020-08-14 ENCOUNTER — Encounter: Payer: 59 | Admitting: Physical Therapy

## 2020-08-23 ENCOUNTER — Inpatient Hospital Stay: Payer: 59 | Attending: Hematology and Oncology | Admitting: Hematology and Oncology

## 2020-08-23 NOTE — Progress Notes (Deleted)
[facility] CONSULT NOTE  Patient Care Team: [name], [name] as PCP - General (Physician Assistant)  CHIEF COMPLAINTS/PURPOSE OF CONSULTATION:  Iron deficiency anemia.  ASSESSMENT & PLAN:  No problem-specific Assessment & Plan notes found for this encounter.  No orders of the defined types were placed in this encounter.  This is a very pleasant 50 year old female patient with medical history significant for chronic abdominal issues, chronic iron deficiency anemia, bipolar disorder, chronic pain referred to hematology for evaluation and recommendations regarding her iron deficiency anemia. Patient is a good historian, tells me that she has always been iron deficient, cannot tolerate oral iron supplementation and got IV iron many years ago which she tolerated well. Pertinent review of systems include some fatigue but most of her symptoms are chronic abdominal symptoms, no    We discussed that there is increased risk of breast cancer with estrogen and testosterone supplementation, she mentioned she is aware and she is doing yearly mammograms.  Left her a voicemail recommending genetic testing given her mom's history of ovarian cancer. Gave her our phone number to call back and name of my nurses to reach out to if she is interested in the referral.  Thank you for consulting [place] in the care of this patient.  Please not hesitate to contact us with any additional questions or concerns.  HISTORY OF PRESENTING ILLNESS:   Connie Murray 50 y.o. female is here because of IDA.  This is a very pleasant 50 yr female patient with PMH significant for HTN, dyslipidemia, abdominal problems for several years, previously treated for questionable Crohn's disease, bipolar disorder, chronic pain, chronic iron deficiency anemia referred to hematology for management of iron deficiency anemia.     MEDICAL HISTORY:  Past Medical History:  Diagnosis Date  . Allergy   . Anxiety   . Asthma     stress related, perfumes, dyes and certain flowers   . Bipolar disorder (HCC)   . Breast mass, right 12/30/2016  . Cataract    removed with lens implants bilat   . Cervical spondylosis   . Chronic headaches   . Chronic lower back pain   . Degenerative arthritis   . Depression   . Dyslipidemia   . Fibromyalgia   . GERD (gastroesophageal reflux disease)   . Heart murmur    cildhood heart murmur  . Hyperlipidemia   . Hypertension   . Migraine   . Myalgia and myositis, unspecified 11/27/2012  . Normocytic anemia   . Palpitations   . Pneumonia   . Radiculopathy    C8 or T1    SURGICAL HISTORY: Past Surgical History:  Procedure Laterality Date  . ABDOMINAL HYSTERECTOMY    . BREAST BIOPSY Right 01/19/2016  . BREAST BIOPSY Right 01/19/2016  . BREAST LUMPECTOMY WITH RADIOACTIVE SEED LOCALIZATION Right 12/30/2016   Procedure: RIGHT BREAST LUMPECTOMY WITH RADIOACTIVE SEED LOCALIZATION;  Surgeon: Claud Kelp, MD;  Location: [facility];  Service: General;  Laterality: Right;  ERAS PATHWAY  . Broken Foot Right   . COLONOSCOPY    . dental     3 extractions February 2019  . DENTAL RESTORATION/EXTRACTION WITH X-RAY     9 total  . EYE SURGERY     bilateral cataract removal with lens placement June & July 2020  . NASAL SINUS SURGERY    . TOTAL ABDOMINAL HYSTERECTOMY W/ BILATERAL SALPINGOOPHORECTOMY  01/2010  . UPPER GASTROINTESTINAL ENDOSCOPY    . VENTRAL HERNIA REPAIR N/A 03/19/2019  Procedure: LAPAROSCOPIC INCARCERATED EPIGASTIRC HERNIA REPAIR With MESH;  Surgeon: Claud KelpIngram, [place], MD;  [facility];  Service: General;  Laterality: N/A;    SOCIAL HISTORY: Social History   Socioeconomic History  . Marital status: Single    Spouse name: Not on file  . Number of children: 1  . Years of education: Not on file  . Highest education level: Not on file  Occupational History  . Occupation: Disabled    Employer: DISABLED  Tobacco Use  . Smoking status: Current  Every Day Smoker    Packs/day: 1.00    Years: 28.00    Pack years: 28.00    Types: Cigarettes    Start date: 12/13/1982  . Smokeless tobacco: Never Used  . Tobacco comment: between 1/2 - 1 pack cigs per day  Vaping Use  . Vaping Use: Former  Substance and Sexual Activity  . Alcohol use: No  . Drug use: No  . Sexual activity: Not on file  Other Topics Concern  . Not on file  Social History Narrative  . Not on file   Social Determinants of Health   Financial Resource Strain: Not on file  Food Insecurity: Not on file  Transportation Needs: Not on file  Physical Activity: Not on file  Stress: Not on file  Social Connections: Not on file  Intimate Partner Violence: Not on file    FAMILY HISTORY: Family History  Problem Relation Age of Onset  . Ovarian cancer Mother   . Colon polyps Mother   . Alcoholism Father   . Irritable bowel syndrome Other   . Bone cancer Maternal Uncle   . Breast cancer Paternal Grandmother   . Brain cancer Maternal Uncle   . Colon cancer Neg Hx   . Esophageal cancer Neg Hx   . Pancreatic cancer Neg Hx   . Rectal cancer Neg Hx   . Stomach cancer Neg Hx   . Prostate cancer Neg Hx     ALLERGIES:  is allergic to bee venom, cephalexin, chlorhexidine, clindamycin/lincomycin, molds & smuts, nsaids, statins, acetaminophen, codeine, darvocet [propoxyphene n-acetaminophen], latex, red dye, and tetracyclines & related.  MEDICATIONS:  Current Outpatient Medications  Medication Sig Dispense Refill  . ALPRAZolam (XANAX) 1 MG tablet Take 1 mg by mouth at bedtime as needed for anxiety.    . B Complex-C (B-COMPLEX WITH VITAMIN C) tablet Take 2 tablets by mouth daily.     . Bacillus Coagulans-Inulin (PROBIOTIC) 1-250 BILLION-MG CAPS Take by mouth.    . Cholecalciferol (VITAMIN D PO) Take 1,000 Units by mouth daily.     . cloNIDine (CATAPRES) 0.1 MG tablet Take 0.1 mg by mouth 4 (four) times daily.    . Cyanocobalamin (VITAMIN B 12 PO) Take 3,000 mcg by mouth  daily.    . diclofenac (VOLTAREN) 75 MG EC tablet Take 75 mg by mouth 2 (two) times daily.    . diclofenac Sodium (VOLTAREN) 1 % GEL diclofenac 1 % topical gel  APPLY TOPICALLY TO THE AFFECTED AREA FOUR TIMES DAILY AS NEEDED    . EPINEPHrine (EPIPEN) 0.3 mg/0.3 mL SOAJ Inject 0.3 mLs (0.3 mg total) into the muscle once. 1 Device 5  . estradiol (ESTRACE) 2 MG tablet Take 2 mg by mouth daily.    . famotidine (PEPCID) 20 MG tablet Take by mouth.    . fexofenadine (ALLEGRA) 180 MG tablet Take 180 mg by mouth daily.    . Magnesium 500 MG CAPS Take 1,000 mg by mouth daily.     .Marland Kitchen  Melatonin 10 MG CAPS Take by mouth.    . Multiple Vitamins-Minerals (ONE DAILY MENS 50+ MULTIVIT PO) Take 1 tablet by mouth daily.    Marland Kitchen omeprazole (PRILOSEC) 40 MG capsule Take 1 capsule (40 mg total) by mouth 2 (two) times daily. 180 capsule 3  . OnabotulinumtoxinA, Cosmetic, (BOTOX COSMETIC IM) Inject into the muscle every 3 (three) months.    . ondansetron (ZOFRAN ODT) 4 MG disintegrating tablet Take 1 tablet (4 mg total) by mouth 3 (three) times daily as needed for nausea or vomiting (and vomiting). Place one tablet under tongue 1 hour before colonoscopy prep and q 6 hours as needed. 60 tablet 1  . prednisoLONE 5 MG TABS tablet Take 5 mg by mouth daily. (Patient not taking: No sig reported)    . SUMAtriptan (IMITREX) 100 MG tablet Take 100 mg by mouth every 2 (two) hours as needed for migraine.    . SUMAtriptan (TOSYMRA) 10 MG/ACT SOLN Tosymra 10 mg/actuation nasal spray  USE 1 SPRAY NASALLY AS NEEDED FOR 30 DAYS    . SYMBICORT 80-4.5 MCG/ACT inhaler Inhale 2 puffs into the lungs 2 (two) times daily.    Marland Kitchen testosterone cypionate (DEPOTESTOTERONE CYPIONATE) 100 MG/ML injection Inject 100 mg into the muscle every 8 (eight) weeks.    . topiramate (TOPAMAX) 25 MG tablet Take 100 mg by mouth daily.     No current facility-administered medications for this visit.     PHYSICAL EXAMINATION:  ECOG PERFORMANCE STATUS: 0 -  Asymptomatic from IDA stand point.  There were no vitals filed for this visit. There were no vitals filed for this visit.  GENERAL:alert, no distress and comfortable. Pt is talkative and is a good historian. SKIN: skin color, texture, turgor are normal, no rashes or significant lesions. Pallor noted NECK: supple, thyroid normal size, non-tender, without nodularity LYMPH:  no palpable lymphadenopathy in the cervical, axillary or inguinal LUNGS: clear to auscultation and percussion with normal breathing effort HEART: regular rate & rhythm and no murmurs and no lower extremity edema ABDOMEN:abdomen soft, non-tender and normal bowel sounds Musculoskeletal:no cyanosis of digits and no clubbing  PSYCH: alert & oriented x 3 with fluent speech NEURO: no focal motor/sensory deficits Skin: Maculopapular rash noted both legs, she says this is the only symptom from COVID, healed.  LABORATORY DATA:  I have reviewed the data as listed Lab Results  Component Value Date   WBC 9.2 02/22/2020   HGB 10.8 (L) 02/22/2020   HCT 33.7 (L) 02/22/2020   MCV 77.2 (L) 02/22/2020   PLT 457.0 (H) 02/22/2020     Chemistry      Component Value Date/Time   NA 139 02/22/2020 1507   K 3.8 02/22/2020 1507   CL 107 02/22/2020 1507   CO2 22 02/22/2020 1507   BUN 16 02/22/2020 1507   CREATININE 1.11 02/22/2020 1507   CREATININE 1.41 (H) 03/09/2013 1444      Component Value Date/Time   CALCIUM 9.5 02/22/2020 1507   ALKPHOS 61 02/22/2020 1507   AST 16 02/22/2020 1507   ALT 13 02/22/2020 1507   BILITOT 0.3 02/22/2020 1507       RADIOGRAPHIC STUDIES: I have personally reviewed the radiological images as listed and agreed with the findings in the report. No results found.  All questions were answered. The patient knows to call the clinic with any problems, questions or concerns. I have spent total of 45 minutes in the care of this patient including H and P, review of medical  records, counseling and  coordination of care.   Rachel Moulds, MD 08/23/2020 9:24 AM

## 2020-08-30 ENCOUNTER — Other Ambulatory Visit: Payer: Self-pay | Admitting: Obstetrics & Gynecology

## 2020-08-30 ENCOUNTER — Inpatient Hospital Stay: Admission: RE | Admit: 2020-08-30 | Payer: 59 | Source: Ambulatory Visit

## 2020-08-30 DIAGNOSIS — N6320 Unspecified lump in the left breast, unspecified quadrant: Secondary | ICD-10-CM

## 2020-09-22 ENCOUNTER — Other Ambulatory Visit: Payer: Self-pay

## 2020-09-22 ENCOUNTER — Ambulatory Visit
Admission: RE | Admit: 2020-09-22 | Discharge: 2020-09-22 | Disposition: A | Discharge status: Home / Self Care | Payer: 59 | Source: Ambulatory Visit | Attending: Obstetrics & Gynecology | Admitting: Obstetrics & Gynecology

## 2020-09-22 ENCOUNTER — Other Ambulatory Visit: Payer: 59

## 2020-09-22 DIAGNOSIS — N6320 Unspecified lump in the left breast, unspecified quadrant: Secondary | ICD-10-CM

## 2020-09-28 ENCOUNTER — Telehealth: Payer: Self-pay | Admitting: Hematology and Oncology

## 2020-09-28 NOTE — Telephone Encounter (Signed)
Pt called back and confirmed appt date and time.

## 2020-09-28 NOTE — Telephone Encounter (Signed)
Scheduled appt per 5/12 sch msg. Called pt, no answer. Left msg with appt date and time.  

## 2020-10-02 NOTE — Progress Notes (Signed)
[facility] FOLLOW UP NOTE  Patient Care Team: [name], [name] as PCP - General (Physician Assistant)  CHIEF COMPLAINTS/PURPOSE OF CONSULTATION:  Follow up on IDA.  ASSESSMENT & PLAN:  IRON DEFICIENCY This is a pleasant 50 yr old female patient with PMH significant for HTN, dyslipidemia, referred to hematology for evaluation of IDA She was last seen in Dec 2021. She received one dose of ferumyxtol in Dec 2021. Since then, she no showed to multiple appointments.  Patient says she never heard back from our schedulers about the appointments. Most recently we received some blood work from her PCP which showed that her IDA has significantly improved. There is no immediate need for iron supplementation. I would recommend she FU in 4-6 months with repeat labs. She is agreeable to this. We have again discussed about increased risk of breast cancer with estradiol and testosterone supplementation.  She acknowledges this. All her questions were answered to the best of my knowledge. Thank you for consulting [place] in the care of this patient.  Please not hesitate to contact us with any additional questions or concerns.  Orders Placed This Encounter  Procedures  . CBC with Differential/Platelet    Standing Status:   Standing    Number of Occurrences:   22    Standing Expiration Date:   10/03/2021  . CMP (Cancer Center only)    Standing Status:   Future    Standing Expiration Date:   10/03/2021  . Iron and TIBC    Standing Status:   Future    Standing Expiration Date:   10/03/2021  . Ferritin    Standing Status:   Future    Standing Expiration Date:   10/03/2021     HISTORY OF PRESENTING ILLNESS:  Connie Murray 50 y.o. female is here because of IDA  INTERIM HISTORY  Ms. Cuadrado is here for a follow-up appointment after her iron infusion. She tells me that she only received 1 dose of ferumoxytol and have not heard from our schedulers.  On our end,  it appears as if she no showed  to multiple of her appointments. She continues to have some fatigue, chronic insomnia.  No bleeding in her bowels or hematuria. No vaginal bleeding. She recently had her mammogram which was BI-RADS 1.  According to the radiologist note, she no showed for her appointment for mammogram. She had her last colonoscopy in December 2021 which showed normal exam, no concerns. Rest of the pertinent 10 point ROS reviewed and negative.  MEDICAL HISTORY:  Past Medical History:  Diagnosis Date  . Allergy   . Anxiety   . Asthma    stress related, perfumes, dyes and certain flowers   . Bipolar disorder (HCC)   . Breast mass, right 12/30/2016  . Cataract    removed with lens implants bilat   . Cervical spondylosis   . Chronic headaches   . Chronic lower back pain   . Degenerative arthritis   . Depression   . Dyslipidemia   . Fibromyalgia   . GERD (gastroesophageal reflux disease)   . Heart murmur    cildhood heart murmur  . Hyperlipidemia   . Hypertension   . Migraine   . Myalgia and myositis, unspecified 11/27/2012  . Normocytic anemia   . Palpitations   . Pneumonia   . Radiculopathy    C8 or T1    SURGICAL HISTORY: Past Surgical History:  Procedure Laterality Date  . ABDOMINAL HYSTERECTOMY    .  BREAST BIOPSY Right 01/19/2016  . BREAST BIOPSY Right 01/19/2016  . BREAST LUMPECTOMY WITH RADIOACTIVE SEED LOCALIZATION Right 12/30/2016   Procedure: RIGHT BREAST LUMPECTOMY WITH RADIOACTIVE SEED LOCALIZATION;  Surgeon: Claud Kelp, MD;  Location: [facility];  Service: General;  Laterality: Right;  ERAS PATHWAY  . Broken Foot Right   . COLONOSCOPY    . dental     3 extractions February 2019  . DENTAL RESTORATION/EXTRACTION WITH X-RAY     9 total  . EYE SURGERY     bilateral cataract removal with lens placement June & July 2020  . NASAL SINUS SURGERY    . TOTAL ABDOMINAL HYSTERECTOMY W/ BILATERAL SALPINGOOPHORECTOMY  01/2010  . UPPER GASTROINTESTINAL ENDOSCOPY    .  VENTRAL HERNIA REPAIR N/A 03/19/2019   Procedure: LAPAROSCOPIC INCARCERATED EPIGASTIRC HERNIA REPAIR With MESH;  Surgeon: Claud Kelp, MD;  Location: [facility] OR;  Service: General;  Laterality: N/A;    SOCIAL HISTORY: Social History   Socioeconomic History  . Marital status: Single    Spouse name: Not on file  . Number of children: 1  . Years of education: Not on file  . Highest education level: Not on file  Occupational History  . Occupation: Disabled    Employer: DISABLED  Tobacco Use  . Smoking status: Current Every Day Smoker    Packs/day: 1.00    Years: 28.00    Pack years: 28.00    Types: Cigarettes    Start date: 12/13/1982  . Smokeless tobacco: Never Used  . Tobacco comment: between 1/2 - 1 pack cigs per day  Vaping Use  . Vaping Use: Former  Substance and Sexual Activity  . Alcohol use: No  . Drug use: No  . Sexual activity: Not on file  Other Topics Concern  . Not on file  Social History Narrative  . Not on file   Social Determinants of Health   Financial Resource Strain: Not on file  Food Insecurity: Not on file  Transportation Needs: Not on file  Physical Activity: Not on file  Stress: Not on file  Social Connections: Not on file  Intimate Partner Violence: Not on file    FAMILY HISTORY: Family History  Problem Relation Age of Onset  . Ovarian cancer Mother   . Colon polyps Mother   . Alcoholism Father   . Irritable bowel syndrome Other   . Bone cancer Maternal Uncle   . Breast cancer Paternal Grandmother   . Brain cancer Maternal Uncle   . Colon cancer Neg Hx   . Esophageal cancer Neg Hx   . Pancreatic cancer Neg Hx   . Rectal cancer Neg Hx   . Stomach cancer Neg Hx   . Prostate cancer Neg Hx     ALLERGIES:  is allergic to bee venom, cephalexin, chlorhexidine, clindamycin/lincomycin, molds & smuts, nsaids, statins, acetaminophen, codeine, darvocet [propoxyphene n-acetaminophen], latex, red dye, and tetracyclines & related.  MEDICATIONS:   Current Outpatient Medications  Medication Sig Dispense Refill  . ALPRAZolam (XANAX) 1 MG tablet Take 1 mg by mouth 2 (two) times daily as needed for anxiety.    . B Complex-C (B-COMPLEX WITH VITAMIN C) tablet Take 2 tablets by mouth daily.     . Bacillus Coagulans-Inulin (PROBIOTIC) 1-250 BILLION-MG CAPS Take by mouth.    . budesonide-formoterol (SYMBICORT) 80-4.5 MCG/ACT inhaler Symbicort 80 mcg-4.5 mcg/actuation HFA aerosol inhaler  INHALE 2 PUFFS INTO THE LUNGS TWICE DAILY    . buPROPion (WELLBUTRIN XL) 150 MG 24 hr  tablet Take by mouth.    . Cholecalciferol (VITAMIN D PO) Take 1,000 Units by mouth daily.     . cloNIDine (CATAPRES) 0.1 MG tablet Take 0.1 mg by mouth 4 (four) times daily.    . diclofenac (VOLTAREN) 75 MG EC tablet Take 75 mg by mouth 2 (two) times daily.    . diclofenac Sodium (VOLTAREN) 1 % GEL diclofenac 1 % topical gel  APPLY TOPICALLY TO THE AFFECTED AREA FOUR TIMES DAILY AS NEEDED    . EPINEPHrine (EPIPEN) 0.3 mg/0.3 mL SOAJ Inject 0.3 mLs (0.3 mg total) into the muscle once. 1 Device 5  . estradiol (ESTRACE) 2 MG tablet Take 2 mg by mouth daily.    . famotidine (PEPCID) 20 MG tablet Take by mouth.    . fexofenadine (ALLEGRA) 180 MG tablet Take 180 mg by mouth daily.    . Magnesium 500 MG CAPS Take 1,000 mg by mouth daily.     . Melatonin 10 MG CAPS Take by mouth.    . Multiple Vitamins-Minerals (ONE DAILY MENS 50+ MULTIVIT PO) Take 1 tablet by mouth daily.    Marland Kitchen. omeprazole (PRILOSEC) 40 MG capsule Take 1 capsule (40 mg total) by mouth 2 (two) times daily. 180 capsule 3  . OnabotulinumtoxinA, Cosmetic, (BOTOX COSMETIC IM) Inject into the muscle every 3 (three) months.    . ondansetron (ZOFRAN ODT) 4 MG disintegrating tablet Take 1 tablet (4 mg total) by mouth 3 (three) times daily as needed for nausea or vomiting (and vomiting). Place one tablet under tongue 1 hour before colonoscopy prep and q 6 hours as needed. 60 tablet 1  . SUMAtriptan (IMITREX) 100 MG tablet Take  100 mg by mouth every 2 (two) hours as needed for migraine.    . SUMAtriptan (TOSYMRA) 10 MG/ACT SOLN Tosymra 10 mg/actuation nasal spray  USE 1 SPRAY NASALLY AS NEEDED FOR 30 DAYS    . testosterone cypionate (DEPOTESTOTERONE CYPIONATE) 100 MG/ML injection Inject 100 mg into the muscle every 8 (eight) weeks.    . topiramate (TOPAMAX) 25 MG tablet Take 100 mg by mouth daily. (Patient not taking: Reported on 10/03/2020)     No current facility-administered medications for this visit.  She takes Topamax 200 mg daily.  PHYSICAL EXAMINATION: ECOG PERFORMANCE STATUS: 1 - Symptomatic but completely ambulatory  Vitals:   10/03/20 0854  BP: 111/71  Pulse: 70  Resp: 17  Temp: 97.8 F (36.6 C)  SpO2: 100%   Filed Weights   10/03/20 0854  Weight: 115 lb 11.2 oz (52.5 kg)    GENERAL:alert, no distress and comfortable, petite SKIN: skin color, texture, turgor are normal, no rashes or significant lesions EYES: normal, conjunctiva are pink and non-injected, sclera clear OROPHARYNX:no exudate, no erythema and lips, buccal mucosa, and tongue normal  NECK: supple, thyroid normal size, non-tender, without nodularity LYMPH:  no palpable lymphadenopathy in the cervical, axillary LUNGS: clear to auscultation and percussion with normal breathing effort HEART: regular rate & rhythm and no murmurs and no lower extremity edema ABDOMEN:abdomen soft, non-tender and normal bowel sounds Musculoskeletal:no cyanosis of digits and no clubbing  PSYCH: alert & oriented x 3 with fluent speech NEURO: no focal motor/sensory deficits  LABORATORY DATA:  I have reviewed the data as listed Lab Results  Component Value Date   WBC 9.2 02/22/2020   HGB 10.8 (L) 02/22/2020   HCT 33.7 (L) 02/22/2020   MCV 77.2 (L) 02/22/2020   PLT 457.0 (H) 02/22/2020     Chemistry  Component Value Date/Time   NA 139 02/22/2020 1507   K 3.8 02/22/2020 1507   CL 107 02/22/2020 1507   CO2 22 02/22/2020 1507   BUN 16  02/22/2020 1507   CREATININE 1.11 02/22/2020 1507   CREATININE 1.41 (H) 03/09/2013 1444      Component Value Date/Time   CALCIUM 9.5 02/22/2020 1507   ALKPHOS 61 02/22/2020 1507   AST 16 02/22/2020 1507   ALT 13 02/22/2020 1507   BILITOT 0.3 02/22/2020 1507       RADIOGRAPHIC STUDIES: I have personally reviewed the radiological images as listed and agreed with the findings in the report. 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 Patient failed to return for her 3 month follow-up as it has been 1 year since her last evaluation. She is due for her annual bilateral mammogram. Patient no longer feels this palpable abnormality. EXAM: DIGITAL DIAGNOSTIC BILATERAL MAMMOGRAM WITH TOMOSYNTHESIS AND CAD; ULTRASOUND LEFT BREAST LIMITED TECHNIQUE: Bilateral digital diagnostic mammography and breast tomosynthesis was performed. The images were evaluated with computer-aided detection.; Targeted ultrasound examination of the left breast was performed COMPARISON:  Previous exam(s). ACR Breast Density Category c: The breast tissue is heterogeneously dense, which may obscure small masses. FINDINGS: Examination demonstrates no focal abnormality over the outer lower left breast. Left breast is otherwise unchanged. Right breast is unchanged. Targeted ultrasound is performed, showing resolution of the previously seen mixed echogenicity 2.3 cm mass over the 3:30 position of the left breast 3 cm from the nipple compatible with resolved fat necrosis. IMPRESSION: Resolved fat necrosis over the 3:30 position of the left breast. Otherwise, stable mammogram. RECOMMENDATION: Recommend continued annual bilateral screening mammographic follow-up. I have discussed the findings and recommendations with the patient. If applicable, a reminder letter will be  sent to the patient regarding the next appointment. BI-RADS CATEGORY  1: Negative. Electronically Signed   By: Elberta Fortis M.D.   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 Patient failed to return for her 3 month follow-up as it has been 1 year since her last evaluation. She is due for her annual bilateral mammogram. Patient no longer feels this palpable abnormality. EXAM: DIGITAL DIAGNOSTIC BILATERAL MAMMOGRAM WITH TOMOSYNTHESIS AND CAD; ULTRASOUND LEFT BREAST LIMITED TECHNIQUE: Bilateral digital diagnostic mammography and breast tomosynthesis was performed. The images were evaluated with computer-aided detection.; Targeted ultrasound examination of the left breast was performed COMPARISON:  Previous exam(s). ACR Breast Density Category c: The breast tissue is heterogeneously dense, which may obscure small masses. FINDINGS: Examination demonstrates no focal abnormality over the outer lower left breast. Left breast is otherwise unchanged. Right breast is unchanged. Targeted ultrasound is performed, showing resolution of the previously seen mixed echogenicity 2.3 cm mass over the 3:30 position of the left breast 3 cm from the nipple compatible with resolved fat necrosis. IMPRESSION: Resolved fat necrosis over the 3:30 position of the left breast. Otherwise, stable mammogram. RECOMMENDATION: Recommend continued annual bilateral screening mammographic follow-up. I have discussed the findings and recommendations with the patient. If applicable, a reminder letter will be sent to the patient regarding the next appointment. BI-RADS CATEGORY  1: Negative. Electronically Signed   By:  Elberta Fortis M.D.   On: 09/22/2020 08:56    All questions were answered. The patient knows to call the clinic with any problems, questions  or concerns. I have reviewed labs from outside, ordered by her primary care physician.  Her CBC and iron parameters appear significantly improved.    Rachel Moulds, MD 10/03/2020 9:15 AM

## 2020-10-03 ENCOUNTER — Other Ambulatory Visit: Payer: Self-pay

## 2020-10-03 ENCOUNTER — Encounter: Payer: Self-pay | Admitting: Hematology and Oncology

## 2020-10-03 ENCOUNTER — Telehealth: Payer: Self-pay | Admitting: Hematology and Oncology

## 2020-10-03 ENCOUNTER — Inpatient Hospital Stay: Payer: Medicare Other | Attending: Hematology and Oncology | Admitting: Hematology and Oncology

## 2020-10-03 DIAGNOSIS — Z803 Family history of malignant neoplasm of breast: Secondary | ICD-10-CM | POA: Insufficient documentation

## 2020-10-03 DIAGNOSIS — D509 Iron deficiency anemia, unspecified: Secondary | ICD-10-CM

## 2020-10-03 DIAGNOSIS — Z808 Family history of malignant neoplasm of other organs or systems: Secondary | ICD-10-CM | POA: Insufficient documentation

## 2020-10-03 DIAGNOSIS — G4700 Insomnia, unspecified: Secondary | ICD-10-CM | POA: Insufficient documentation

## 2020-10-03 DIAGNOSIS — Z90710 Acquired absence of both cervix and uterus: Secondary | ICD-10-CM | POA: Diagnosis not present

## 2020-10-03 DIAGNOSIS — F17210 Nicotine dependence, cigarettes, uncomplicated: Secondary | ICD-10-CM | POA: Insufficient documentation

## 2020-10-03 NOTE — Assessment & Plan Note (Addendum)
This is a pleasant 50 yr old female patient with PMH significant for HTN, dyslipidemia, referred to hematology for evaluation of IDA She was last seen in Dec 2021. She received one dose of ferumyxtol in Dec 2021. Since then, she no showed to multiple appointments.  Patient says she never heard back from our schedulers about the appointments. Most recently we received some blood work from her PCP which showed that her IDA has significantly improved. There is no immediate need for iron supplementation. I would recommend she FU in 4-6 months with repeat labs. She is agreeable to this. We have again discussed about increased risk of breast cancer with estradiol and testosterone supplementation.  She acknowledges this. All her questions were answered to the best of my knowledge. Thank you for consulting [place] in the care of this patient.  Please not hesitate to contact us with any additional questions or concerns.

## 2020-10-03 NOTE — Telephone Encounter (Signed)
Scheduled follow-up appointments per 5/17 los. Patient is aware. 

## 2020-10-27 ENCOUNTER — Other Ambulatory Visit: Payer: Self-pay | Admitting: Gastroenterology

## 2021-01-11 ENCOUNTER — Other Ambulatory Visit: Payer: Self-pay | Admitting: Gastroenterology

## 2021-01-31 ENCOUNTER — Inpatient Hospital Stay: Payer: Medicare Other | Attending: Hematology and Oncology

## 2021-01-31 ENCOUNTER — Other Ambulatory Visit: Payer: Self-pay

## 2021-01-31 DIAGNOSIS — I10 Essential (primary) hypertension: Secondary | ICD-10-CM | POA: Insufficient documentation

## 2021-01-31 DIAGNOSIS — Z87891 Personal history of nicotine dependence: Secondary | ICD-10-CM | POA: Diagnosis not present

## 2021-01-31 DIAGNOSIS — D509 Iron deficiency anemia, unspecified: Secondary | ICD-10-CM | POA: Insufficient documentation

## 2021-01-31 LAB — CBC WITH DIFFERENTIAL/PLATELET
Abs Immature Granulocytes: 0.03 10*3/uL (ref 0.00–0.07)
Basophils Absolute: 0.1 10*3/uL (ref 0.0–0.1)
Basophils Relative: 1 %
Eosinophils Absolute: 0.2 10*3/uL (ref 0.0–0.5)
Eosinophils Relative: 4 %
HCT: 33.4 % — ABNORMAL LOW (ref 36.0–46.0)
Hemoglobin: 11.4 g/dL — ABNORMAL LOW (ref 12.0–15.0)
Immature Granulocytes: 1 %
Lymphocytes Relative: 39 %
Lymphs Abs: 2 10*3/uL (ref 0.7–4.0)
MCH: 31.5 pg (ref 26.0–34.0)
MCHC: 34.1 g/dL (ref 30.0–36.0)
MCV: 92.3 fL (ref 80.0–100.0)
Monocytes Absolute: 0.6 10*3/uL (ref 0.1–1.0)
Monocytes Relative: 12 %
Neutro Abs: 2.3 10*3/uL (ref 1.7–7.7)
Neutrophils Relative %: 43 %
Platelets: 273 10*3/uL (ref 150–400)
RBC: 3.62 MIL/uL — ABNORMAL LOW (ref 3.87–5.11)
RDW: 13.1 % (ref 11.5–15.5)
WBC: 5.2 10*3/uL (ref 4.0–10.5)
nRBC: 0 % (ref 0.0–0.2)

## 2021-01-31 LAB — CMP (CANCER CENTER ONLY)
ALT: 15 U/L (ref 0–44)
AST: 20 U/L (ref 15–41)
Albumin: 3 g/dL — ABNORMAL LOW (ref 3.5–5.0)
Alkaline Phosphatase: 63 U/L (ref 38–126)
Anion gap: 10 (ref 5–15)
BUN: 8 mg/dL (ref 6–20)
CO2: 26 mmol/L (ref 22–32)
Calcium: 8.6 mg/dL — ABNORMAL LOW (ref 8.9–10.3)
Chloride: 104 mmol/L (ref 98–111)
Creatinine: 0.8 mg/dL (ref 0.44–1.00)
GFR, Estimated: >60 mL/min (ref >60)
Glucose, Bld: 92 mg/dL (ref 70–99)
Potassium: 3.8 mmol/L (ref 3.5–5.1)
Sodium: 140 mmol/L (ref 135–145)
Total Bilirubin: 0.3 mg/dL (ref 0.3–1.2)
Total Protein: 6.2 g/dL — ABNORMAL LOW (ref 6.5–8.1)

## 2021-01-31 LAB — IRON AND TIBC
Iron: 138 ug/dL (ref 41–142)
Saturation Ratios: 53 % (ref 21–57)
TIBC: 259 ug/dL (ref 236–444)
UIBC: 121 ug/dL (ref 120–384)

## 2021-01-31 LAB — FERRITIN: Ferritin: 39 ng/mL (ref 11–307)

## 2021-02-01 ENCOUNTER — Encounter: Payer: Self-pay | Admitting: Hematology and Oncology

## 2021-02-01 ENCOUNTER — Inpatient Hospital Stay (HOSPITAL_BASED_OUTPATIENT_CLINIC_OR_DEPARTMENT_OTHER): Payer: Medicare Other | Admitting: Hematology and Oncology

## 2021-02-01 DIAGNOSIS — D509 Iron deficiency anemia, unspecified: Secondary | ICD-10-CM

## 2021-02-01 NOTE — Progress Notes (Signed)
[facility] FOLLOW UP NOTE  Patient Care Team: [name], [name] as PCP - General (Physician Assistant)  CHIEF COMPLAINTS/PURPOSE OF CONSULTATION:  Follow up on IDA.  ASSESSMENT & PLAN:  IRON DEFICIENCY This is a pleasant 50 yr old female patient with PMH significant for HTN, dyslipidemia, referred to hematology for evaluation of IDA She was last seen in Dec 2021. She received one dose of ferumyxtol in Dec 2021. Since last visit, she is feeling more fatigued but otherwise denies any new complaints. Her labs from yesterday showed slight improvement in hemoglobin with ferritin in normal limits at this time.  She cannot tolerate any oral iron.  We will plan to proceed with planned second dose of ferumoxytol. Have discussed about increased risk of breast cancer with testosterone and estradiol supplementation.  She expressed understanding of it and she is willing to continue the medication with annual mammograms.  Her most recent mammogram showed some resolving fat necrosis, no other malignancy concerns. Physical examination today quite unremarkable. I have recommended to proceed with second dose of iron infusion, then return to clinic in 6 months with repeat labs.  No orders of the defined types were placed in this encounter.  Last colonoscopy in December 2021 according to the patient  HISTORY OF PRESENTING ILLNESS:  Connie Murray 50 y.o. female is here because of IDA  INTERIM HISTORY  Ms. Cisar is here for a follow-up   She denies any new health complaints except for lack of energy.  She was hoping if she can get the second infusion of Feraheme that was initially planned.  She cannot tolerate oral iron.  She has quit smoking and she is very proud of it. She denies any change in her bowel habits or hematochezia or melena.  She continues on testosterone and estradiol supplementation, she understands the increased risk of breast cancer.  She had a mammogram in May which was  benign. Rest of the pertinent 10 point ROS reviewed and negative.  MEDICAL HISTORY:  Past Medical History:  Diagnosis Date   Allergy    Anxiety    Asthma    stress related, perfumes, dyes and certain flowers    Bipolar disorder (HCC)    Breast mass, right 12/30/2016   Cataract    removed with lens implants bilat    Cervical spondylosis    Chronic headaches    Chronic lower back pain    Degenerative arthritis    Depression    Dyslipidemia    Fibromyalgia    GERD (gastroesophageal reflux disease)    Heart murmur    cildhood heart murmur   Hyperlipidemia    Hypertension    Migraine    Myalgia and myositis, unspecified 11/27/2012   Normocytic anemia    Palpitations    Pneumonia    Radiculopathy    C8 or T1    SURGICAL HISTORY: Past Surgical History:  Procedure Laterality Date   ABDOMINAL HYSTERECTOMY     BREAST BIOPSY Right 01/19/2016   BREAST BIOPSY Right 01/19/2016   BREAST LUMPECTOMY WITH RADIOACTIVE SEED LOCALIZATION Right 12/30/2016   Procedure: RIGHT BREAST LUMPECTOMY WITH RADIOACTIVE SEED LOCALIZATION;  Surgeon: Claud Kelp, MD;  Location: [facility];  Service: General;  Laterality: Right;  ERAS PATHWAY   Broken Foot Right    COLONOSCOPY     dental     3 extractions February 2019   DENTAL RESTORATION/EXTRACTION WITH X-RAY     9 total   EYE SURGERY  bilateral cataract removal with lens placement June & July 2020   NASAL SINUS SURGERY     TOTAL ABDOMINAL HYSTERECTOMY W/ BILATERAL SALPINGOOPHORECTOMY  01/2010   UPPER GASTROINTESTINAL ENDOSCOPY     VENTRAL HERNIA REPAIR N/A 03/19/2019   Procedure: LAPAROSCOPIC INCARCERATED EPIGASTIRC HERNIA REPAIR With MESH;  Surgeon: Claud Kelp, MD;  Location: [facility] OR;  Service: General;  Laterality: N/A;    SOCIAL HISTORY: Social History   Socioeconomic History   Marital status: Single    Spouse name: Not on file   Number of children: 1   Years of education: Not on file   Highest education level:  Not on file  Occupational History   Occupation: Disabled    Employer: DISABLED  Tobacco Use   Smoking status: Every Day    Packs/day: 1.00    Years: 28.00    Pack years: 28.00    Types: Cigarettes    Start date: 12/13/1982   Smokeless tobacco: Never   Tobacco comments:    between 1/2 - 1 pack cigs per day  Vaping Use   Vaping Use: Former  Substance and Sexual Activity   Alcohol use: No   Drug use: No   Sexual activity: Not on file  Other Topics Concern   Not on file  Social History Narrative   Not on file   Social Determinants of Health   Financial Resource Strain: Not on file  Food Insecurity: Not on file  Transportation Needs: Not on file  Physical Activity: Not on file  Stress: Not on file  Social Connections: Not on file  Intimate Partner Violence: Not on file    FAMILY HISTORY: Family History  Problem Relation Age of Onset   Ovarian cancer Mother    Colon polyps Mother    Alcoholism Father    Irritable bowel syndrome Other    Bone cancer Maternal Uncle    Breast cancer Paternal Grandmother    Brain cancer Maternal Uncle    Colon cancer Neg Hx    Esophageal cancer Neg Hx    Pancreatic cancer Neg Hx    Rectal cancer Neg Hx    Stomach cancer Neg Hx    Prostate cancer Neg Hx     ALLERGIES:  is allergic to bee venom, cephalexin, chlorhexidine, clindamycin/lincomycin, molds & smuts, nsaids, statins, acetaminophen, codeine, darvocet [propoxyphene n-acetaminophen], latex, red dye, and tetracyclines & related.  MEDICATIONS:  Current Outpatient Medications  Medication Sig Dispense Refill   ALPRAZolam (XANAX) 1 MG tablet Take 1 mg by mouth 2 (two) times daily as needed for anxiety.     B Complex-C (B-COMPLEX WITH VITAMIN C) tablet Take 2 tablets by mouth daily.      Bacillus Coagulans-Inulin (PROBIOTIC) 1-250 BILLION-MG CAPS Take by mouth.     budesonide-formoterol (SYMBICORT) 80-4.5 MCG/ACT inhaler Symbicort 80 mcg-4.5 mcg/actuation HFA aerosol inhaler  INHALE  2 PUFFS INTO THE LUNGS TWICE DAILY     Cholecalciferol (VITAMIN D PO) Take 1,000 Units by mouth daily.      cloNIDine (CATAPRES) 0.1 MG tablet Take 0.1 mg by mouth 4 (four) times daily.     diclofenac (VOLTAREN) 75 MG EC tablet Take 75 mg by mouth 2 (two) times daily.     diclofenac Sodium (VOLTAREN) 1 % GEL diclofenac 1 % topical gel  APPLY TOPICALLY TO THE AFFECTED AREA FOUR TIMES DAILY AS NEEDED     EPINEPHrine (EPIPEN) 0.3 mg/0.3 mL SOAJ Inject 0.3 mLs (0.3 mg total) into the muscle once. 1 Device 5  estradiol (ESTRACE) 2 MG tablet Take 2 mg by mouth daily.     famotidine (PEPCID) 20 MG tablet Take by mouth.     fexofenadine (ALLEGRA) 180 MG tablet Take 180 mg by mouth daily.     Magnesium 500 MG CAPS Take 1,000 mg by mouth daily.      Melatonin 10 MG CAPS Take by mouth.     Multiple Vitamins-Minerals (ONE DAILY MENS 50+ MULTIVIT PO) Take 1 tablet by mouth daily.     omeprazole (PRILOSEC) 40 MG capsule Take 1 capsule (40 mg total) by mouth 2 (two) times daily. APPOINTMENT NEEDED FOR FURTHER REFILLS 180 capsule 0   OnabotulinumtoxinA, Cosmetic, (BOTOX COSMETIC IM) Inject into the muscle every 3 (three) months.     ondansetron (ZOFRAN ODT) 4 MG disintegrating tablet Take 1 tablet (4 mg total) by mouth 3 (three) times daily as needed for nausea or vomiting (and vomiting). Place one tablet under tongue 1 hour before colonoscopy prep and q 6 hours as needed. 60 tablet 1   SUMAtriptan (IMITREX) 100 MG tablet Take 100 mg by mouth every 2 (two) hours as needed for migraine.     SUMAtriptan (TOSYMRA) 10 MG/ACT SOLN Tosymra 10 mg/actuation nasal spray  USE 1 SPRAY NASALLY AS NEEDED FOR 30 DAYS     testosterone cypionate (DEPOTESTOTERONE CYPIONATE) 100 MG/ML injection Inject 100 mg into the muscle every 8 (eight) weeks.     No current facility-administered medications for this visit.  She takes Topamax 200 mg daily.  PHYSICAL EXAMINATION: ECOG PERFORMANCE STATUS: 1 - Symptomatic but completely  ambulatory  Vitals:   02/01/21 0916  BP: 140/84  Pulse: 71  Resp: 18  Temp: (!) 97.5 F (36.4 C)  SpO2: 98%   Filed Weights   02/01/21 0916  Weight: 124 lb 3 oz (56.3 kg)    Physical Exam Constitutional:      Appearance: Normal appearance.  HENT:     Head: Normocephalic and atraumatic.  Cardiovascular:     Rate and Rhythm: Normal rate and regular rhythm.  Pulmonary:     Effort: Pulmonary effort is normal.     Breath sounds: Normal breath sounds.  Abdominal:     General: Abdomen is flat.     Palpations: Abdomen is soft.  Musculoskeletal:        General: No swelling.     Cervical back: Normal range of motion and neck supple.  Skin:    General: Skin is warm and dry.  Neurological:     General: No focal deficit present.     Mental Status: She is alert.  Psychiatric:        Mood and Affect: Mood normal.     LABORATORY DATA:  I have reviewed the data as listed Lab Results  Component Value Date   WBC 5.2 01/31/2021   HGB 11.4 (L) 01/31/2021   HCT 33.4 (L) 01/31/2021   MCV 92.3 01/31/2021   PLT 273 01/31/2021     Chemistry      Component Value Date/Time   NA 140 01/31/2021 0919   K 3.8 01/31/2021 0919   CL 104 01/31/2021 0919   CO2 26 01/31/2021 0919   BUN 8 01/31/2021 0919   CREATININE 0.80 01/31/2021 0919   CREATININE 1.41 (H) 03/09/2013 1444      Component Value Date/Time   CALCIUM 8.6 (L) 01/31/2021 0919   ALKPHOS 63 01/31/2021 0919   AST 20 01/31/2021 0919   ALT 15 01/31/2021 0919   BILITOT 0.3 01/31/2021  9147     I have reviewed her labs from yesterday.  Hemoglobin showed some improvement, ferritin improved to 39.  Since hemoglobin is still not quite in normal limits, we will proceed with the planned second dose of iron infusion.  RADIOGRAPHIC STUDIES: I have personally reviewed the radiological images as listed and agreed with the findings in the report. No results found.   All questions were answered. The patient knows to call the clinic  with any problems, questions or concerns. I have reviewed labs from outside, ordered by her primary care physician.  Her CBC and iron parameters appear significantly improved.    Rachel Moulds, MD 02/01/2021 9:54 AM

## 2021-02-01 NOTE — Assessment & Plan Note (Signed)
This is a pleasant 50 yr old female patient with PMH significant for HTN, dyslipidemia, referred to hematology for evaluation of IDA She was last seen in Dec 2021. She received one dose of ferumyxtol in Dec 2021. Since last visit, she is feeling more fatigued but otherwise denies any new complaints. Her labs from yesterday showed slight improvement in hemoglobin with ferritin in normal limits at this time.  She cannot tolerate any oral iron.  We will plan to proceed with planned second dose of ferumoxytol. Have discussed about increased risk of breast cancer with testosterone and estradiol supplementation.  She expressed understanding of it and she is willing to continue the medication with annual mammograms.  Her most recent mammogram showed some resolving fat necrosis, no other malignancy concerns. Physical examination today quite unremarkable. I have recommended to proceed with second dose of iron infusion, then return to clinic in 6 months with repeat labs.

## 2021-02-12 ENCOUNTER — Inpatient Hospital Stay: Payer: Medicare Other

## 2021-02-12 ENCOUNTER — Other Ambulatory Visit: Payer: Self-pay

## 2021-02-12 VITALS — BP 101/67 | HR 65 | Temp 97.9°F | Resp 16

## 2021-02-12 DIAGNOSIS — D509 Iron deficiency anemia, unspecified: Secondary | ICD-10-CM | POA: Diagnosis not present

## 2021-02-12 MED ORDER — LORATADINE 10 MG PO TABS
10.0000 mg | ORAL_TABLET | Freq: Once | ORAL | Status: AC
  Administered 2021-02-12: 10 mg via ORAL
  Filled 2021-02-12: qty 1

## 2021-02-12 MED ORDER — SODIUM CHLORIDE 0.9 % IV SOLN
510.0000 mg | Freq: Once | INTRAVENOUS | Status: AC
  Administered 2021-02-12: 510 mg via INTRAVENOUS
  Filled 2021-02-12: qty 510

## 2021-02-12 MED ORDER — FAMOTIDINE 20 MG IN NS 100 ML IVPB
20.0000 mg | Freq: Once | INTRAVENOUS | Status: AC
  Administered 2021-02-12: 20 mg via INTRAVENOUS
  Filled 2021-02-12: qty 100

## 2021-02-12 MED ORDER — SODIUM CHLORIDE 0.9 % IV SOLN: Freq: Once | INTRAVENOUS | Status: AC

## 2021-02-12 MED ORDER — METHYLPREDNISOLONE SODIUM SUCC 125 MG IJ SOLR
125.0000 mg | Freq: Once | INTRAMUSCULAR | Status: AC
  Administered 2021-02-12: 125 mg via INTRAVENOUS
  Filled 2021-02-12: qty 2

## 2021-02-12 NOTE — Patient Instructions (Signed)
Ferumoxytol Injection What is this medication? FERUMOXYTOL (FER ue MOX i tol) treats low levels of iron in your body (iron deficiency anemia). Iron is a mineral that plays an important role in making red blood cells, which carry oxygen from your lungs to the rest of your body. This medicine may be used for other purposes; ask your health care provider or pharmacist if you have questions. COMMON BRAND NAME(S): Feraheme What should I tell my care team before I take this medication? They need to know if you have any of these conditions: Anemia not caused by low iron levels High levels of iron in the blood Magnetic resonance imaging (MRI) test scheduled An unusual or allergic reaction to iron, other medications, foods, dyes, or preservatives Pregnant or trying to get pregnant Breast-feeding How should I use this medication? This medication is for injection into a vein. It is given in a hospital or clinic setting. Talk to your care team the use of this medication in children. Special care may be needed. Overdosage: If you think you have taken too much of this medicine contact a poison control center or emergency room at once. NOTE: This medicine is only for you. Do not share this medicine with others. What if I miss a dose? It is important not to miss your dose. Call your care team if you are unable to keep an appointment. What may interact with this medication? Other iron products This list may not describe all possible interactions. Give your health care provider a list of all the medicines, herbs, non-prescription drugs, or dietary supplements you use. Also tell them if you smoke, drink alcohol, or use illegal drugs. Some items may interact with your medicine. What should I watch for while using this medication? Visit your care team regularly. Tell your care team if your symptoms do not start to get better or if they get worse. You may need blood work done while you are taking this  medication. You may need to follow a special diet. Talk to your care team. Foods that contain iron include: whole grains/cereals, dried fruits, beans, or peas, leafy green vegetables, and organ meats (liver, kidney). What side effects may I notice from receiving this medication? Side effects that you should report to your care team as soon as possible: Allergic reactions-skin rash, itching, hives, swelling of the face, lips, tongue, or throat Low blood pressure-dizziness, feeling faint or lightheaded, blurry vision Shortness of breath Side effects that usually do not require medical attention (report to your care team if they continue or are bothersome): Flushing Headache Joint pain Muscle pain Nausea Pain, redness, or irritation at injection site This list may not describe all possible side effects. Call your doctor for medical advice about side effects. You may report side effects to FDA at 1-800-FDA-1088. Where should I keep my medication? This medication is given in a hospital or clinic and will not be stored at home. NOTE: This sheet is a summary. It may not cover all possible information. If you have questions about this medicine, talk to your doctor, pharmacist, or health care provider.  2022 Elsevier/Gold Standard (2020-09-22 15:35:12)  

## 2021-02-19 ENCOUNTER — Telehealth: Payer: Self-pay | Admitting: Gastroenterology

## 2021-02-19 ENCOUNTER — Other Ambulatory Visit: Payer: Self-pay | Admitting: Gastroenterology

## 2021-02-19 MED ORDER — OMEPRAZOLE 40 MG PO CPDR: 40.0000 mg | DELAYED_RELEASE_CAPSULE | Freq: Two times a day (BID) | ORAL | 0 refills | Status: DC

## 2021-02-19 NOTE — Telephone Encounter (Signed)
Inbound call from patient requesting medication refill for omeprazole sent to [facility] on [address]. Have appt scheduled 11/2

## 2021-02-19 NOTE — Telephone Encounter (Signed)
Prescription sent to patient's pharmacy until scheduled appt. 

## 2021-03-20 ENCOUNTER — Other Ambulatory Visit: Payer: Self-pay | Admitting: Gastroenterology

## 2021-03-21 ENCOUNTER — Ambulatory Visit: Payer: Medicare Other | Admitting: Gastroenterology

## 2021-04-30 ENCOUNTER — Encounter: Payer: Self-pay | Admitting: Gastroenterology

## 2021-04-30 ENCOUNTER — Ambulatory Visit (INDEPENDENT_AMBULATORY_CARE_PROVIDER_SITE_OTHER): Payer: Medicare Other | Admitting: Gastroenterology

## 2021-04-30 VITALS — BP 116/70 | HR 92 | Ht 61.25 in | Wt 125.1 lb

## 2021-04-30 DIAGNOSIS — K219 Gastro-esophageal reflux disease without esophagitis: Secondary | ICD-10-CM | POA: Diagnosis not present

## 2021-04-30 MED ORDER — FAMOTIDINE 20 MG PO TABS: 20.0000 mg | ORAL_TABLET | Freq: Every day | ORAL | 3 refills | Status: AC

## 2021-04-30 MED ORDER — OMEPRAZOLE 40 MG PO CPDR: 40.0000 mg | DELAYED_RELEASE_CAPSULE | Freq: Two times a day (BID) | ORAL | 3 refills | Status: DC

## 2021-04-30 NOTE — Progress Notes (Signed)
    History of Present Illness: This is a 50 year old female with a history of GERD and IBS.  She relates her reflux symptoms have been under very good control.  No active IBS symptoms.  Current Medications, Allergies, Past Medical History, Past Surgical History, Family History and Social History were reviewed in [employer] record.   Physical Exam: General: Well developed, well nourished, no acute distress Head: Normocephalic and atraumatic Eyes: Sclerae anicteric, EOMI Ears: Normal auditory acuity Mouth: Not examined, mask on during Covid-19 pandemic Lungs: Clear throughout to auscultation Heart: Regular rate and rhythm; no murmurs, rubs or bruits Abdomen: Soft, non tender and non distended. No masses, hepatosplenomegaly or hernias noted. Normal Bowel sounds Rectal: Not done Musculoskeletal: Symmetrical with no gross deformities  Pulses:  Normal pulses noted Extremities: No clubbing, cyanosis, edema or deformities noted Neurological: Alert oriented x 4, grossly nonfocal Psychological:  Alert and cooperative. Normal mood and affect   Assessment and Recommendations:  GERD.  Continue omeprazole 40 mg p.o. twice daily and famotidine 20 mg daily. REV in 1 year.  IBS. Currently not active.  IDA followed by hematology.  Most recent hemoglobin was 11.4

## 2021-04-30 NOTE — Patient Instructions (Signed)
We have sent the following medications to your pharmacy for you to pick up at your convenience: omeprazole and famotidine.   The [facility] providers would like to encourage you to use MYCHART to communicate with providers for non-urgent requests or questions.  Due to long hold times on the telephone, sending your provider a message by MYCHART may be a faster and more efficient way to get a response.  Please allow 48 business hours for a response.  Please remember that this is for non-urgent requests.   Thank you for choosing me and [facility].  Malcolm T. Stark, Jr., MD., FACG   

## 2021-05-01 ENCOUNTER — Other Ambulatory Visit: Payer: Self-pay | Admitting: Obstetrics & Gynecology

## 2021-05-01 ENCOUNTER — Other Ambulatory Visit: Payer: Self-pay | Admitting: Physician Assistant

## 2021-08-02 ENCOUNTER — Inpatient Hospital Stay: Payer: Medicare Other | Attending: Hematology and Oncology

## 2021-08-02 ENCOUNTER — Encounter: Payer: Self-pay | Admitting: Hematology and Oncology

## 2021-08-02 ENCOUNTER — Inpatient Hospital Stay (HOSPITAL_BASED_OUTPATIENT_CLINIC_OR_DEPARTMENT_OTHER): Payer: Medicare Other | Admitting: Hematology and Oncology

## 2021-08-02 ENCOUNTER — Other Ambulatory Visit: Payer: Self-pay

## 2021-08-02 VITALS — BP 133/85 | HR 76 | Temp 97.7°F | Resp 16 | Ht 61.0 in | Wt 130.1 lb

## 2021-08-02 DIAGNOSIS — D509 Iron deficiency anemia, unspecified: Secondary | ICD-10-CM | POA: Insufficient documentation

## 2021-08-02 DIAGNOSIS — R5383 Other fatigue: Secondary | ICD-10-CM | POA: Diagnosis not present

## 2021-08-02 DIAGNOSIS — F17210 Nicotine dependence, cigarettes, uncomplicated: Secondary | ICD-10-CM | POA: Diagnosis not present

## 2021-08-02 DIAGNOSIS — Z87891 Personal history of nicotine dependence: Secondary | ICD-10-CM | POA: Insufficient documentation

## 2021-08-02 LAB — CBC WITH DIFFERENTIAL/PLATELET
Abs Immature Granulocytes: 0.02 10*3/uL (ref 0.00–0.07)
Basophils Absolute: 0.1 10*3/uL (ref 0.0–0.1)
Basophils Relative: 1 %
Eosinophils Absolute: 0.2 10*3/uL (ref 0.0–0.5)
Eosinophils Relative: 2 %
HCT: 38.5 % (ref 36.0–46.0)
Hemoglobin: 13.8 g/dL (ref 12.0–15.0)
Immature Granulocytes: 0 %
Lymphocytes Relative: 32 %
Lymphs Abs: 2.2 10*3/uL (ref 0.7–4.0)
MCH: 34.1 pg — ABNORMAL HIGH (ref 26.0–34.0)
MCHC: 35.8 g/dL (ref 30.0–36.0)
MCV: 95.1 fL (ref 80.0–100.0)
Monocytes Absolute: 0.5 10*3/uL (ref 0.1–1.0)
Monocytes Relative: 8 %
Neutro Abs: 3.8 10*3/uL (ref 1.7–7.7)
Neutrophils Relative %: 57 %
Platelets: 243 10*3/uL (ref 150–400)
RBC: 4.05 MIL/uL (ref 3.87–5.11)
RDW: 11.7 % (ref 11.5–15.5)
WBC: 6.7 10*3/uL (ref 4.0–10.5)
nRBC: 0 % (ref 0.0–0.2)

## 2021-08-02 NOTE — Progress Notes (Signed)
Connie Murray ?FOLLOW UP NOTE ? ?Patient Care Team: ?Connie Murray as PCP - General (Physician Assistant) ? ?CHIEF COMPLAINTS/PURPOSE OF CONSULTATION:  ?Follow up on IDA. ? ?ASSESSMENT & PLAN:  ?No problem-specific Assessment & Plan notes found for this encounter. ? ?No orders of the defined types were placed in this encounter. ? ?Last colonoscopy in December 2021 according to the patient ? ?HISTORY OF PRESENTING ILLNESS:  ?Connie Murray 51 y.o. female is here because of IDA ? ?INTERIM HISTORY ? ?Connie Murray is here for a follow-up  ? ?She denies any new health complaints except for lack of energy. She says her doctor says everything is great. She had kidney infection last week and had to take bactrim and ciprofloxaxin, she is now on cipro. ?She doesn't have dysuria, fevers or chills. She complains of some flank pain. ?No change in bowel habits. ?She quit smoking last August ?No hematochezia or melena ?Rest of the pertinent 10 point ROS reviewed and negative. ? ?MEDICAL HISTORY:  ?Past Medical History:  ?Diagnosis Date  ? Allergy   ? Anxiety   ? Asthma   ? stress related, perfumes, dyes and certain flowers   ? Bipolar disorder ([place])   ? Breast mass, right 12/30/2016  ? Cataract   ? removed with lens implants bilat   ? Cervical spondylosis   ? Chronic headaches   ? Chronic lower back pain   ? Degenerative arthritis   ? Depression   ? Dyslipidemia   ? Fibromyalgia   ? GERD (gastroesophageal reflux disease)   ? Heart murmur   ? cildhood heart murmur  ? Hyperlipidemia   ? Hypertension   ? Migraine   ? Myalgia and myositis, unspecified 11/27/2012  ? Normocytic anemia   ? Palpitations   ? Pneumonia   ? Radiculopathy   ? C8 or T1  ? ? ?SURGICAL HISTORY: ?Past Surgical History:  ?Procedure Laterality Date  ? ABDOMINAL HYSTERECTOMY    ? BREAST BIOPSY Right 01/19/2016  ? BREAST BIOPSY Right 01/19/2016  ? BREAST LUMPECTOMY WITH RADIOACTIVE SEED LOCALIZATION Right 12/30/2016  ? Procedure: RIGHT BREAST LUMPECTOMY WITH  RADIOACTIVE SEED LOCALIZATION;  Surgeon: Fanny Skates, MD;  Location: [place];  Service: General;  Laterality: Right;  ERAS PATHWAY  ? Broken Foot Right   ? COLONOSCOPY    ? dental    ? 3 extractions February 2019  ? DENTAL RESTORATION/EXTRACTION WITH X-RAY    ? 9 total  ? EYE SURGERY    ? bilateral cataract removal with lens placement June & July 2020  ? NASAL SINUS SURGERY    ? TOTAL ABDOMINAL HYSTERECTOMY W/ BILATERAL SALPINGOOPHORECTOMY  01/2010  ? UPPER GASTROINTESTINAL ENDOSCOPY    ? VENTRAL HERNIA REPAIR N/A 03/19/2019  ? Procedure: LAPAROSCOPIC INCARCERATED [place] HERNIA REPAIR With MESH;  Surgeon: Fanny Skates, MD;  Location: [place];  Service: General;  Laterality: N/A;  ? ? ?SOCIAL HISTORY: ?Social History  ? ?Socioeconomic History  ? Marital status: Single  ?  Spouse name: Not on file  ? Number of children: 1  ? Years of education: Not on file  ? Highest education level: Not on file  ?Occupational History  ? Occupation: Disabled  ?  Employer: DISABLED  ?Tobacco Use  ? Smoking status: Every Day  ?  Packs/day: 1.00  ?  Years: 28.00  ?  Pack years: 28.00  ?  Types: Cigarettes  ?  Start date: 12/13/1982  ? Smokeless tobacco: Never  ? Tobacco  comments:  ?  between 1/2 - 1 pack cigs per day  ?Vaping Use  ? Vaping Use: Former  ?Substance and Sexual Activity  ? Alcohol use: No  ? Drug use: No  ? Sexual activity: Not on file  ?Other Topics Concern  ? Not on file  ?Social History Narrative  ? Not on file  ? ?Social Determinants of Health  ? ?Financial Resource Strain: Not on file  ?Food Insecurity: Not on file  ?Transportation Needs: Not on file  ?Physical Activity: Not on file  ?Stress: Not on file  ?Social Connections: Not on file  ?Intimate Partner Violence: Not on file  ? ? ?FAMILY HISTORY: ?Family History  ?Problem Relation Age of Onset  ? Ovarian cancer Mother   ? Colon polyps Mother   ? Alcoholism Father   ? Irritable bowel syndrome Other   ? Bone cancer Maternal Uncle   ? Breast  cancer Paternal Grandmother   ? Brain cancer Maternal Uncle   ? Colon cancer Neg Hx   ? Esophageal cancer Neg Hx   ? Pancreatic cancer Neg Hx   ? Rectal cancer Neg Hx   ? Stomach cancer Neg Hx   ? Prostate cancer Neg Hx   ? ? ?ALLERGIES:  is allergic to bee venom, cephalexin, chlorhexidine, clindamycin/lincomycin, molds & smuts, nsaids, statins, acetaminophen, codeine, darvocet [propoxyphene n-acetaminophen], latex, red dye, and tetracyclines & related. ? ?MEDICATIONS:  ?Current Outpatient Medications  ?Medication Sig Dispense Refill  ? ALPRAZolam (XANAX) 1 MG tablet Take 1 mg by mouth 2 (two) times daily as needed for anxiety.    ? B Complex-C (B-COMPLEX WITH VITAMIN C) tablet Take 2 tablets by mouth daily.     ? Bacillus Coagulans-Inulin (PROBIOTIC) 1-250 BILLION-MG CAPS Take by mouth.    ? budesonide-formoterol (SYMBICORT) 80-4.5 MCG/ACT inhaler Symbicort 80 mcg-4.5 mcg/actuation HFA aerosol inhaler ? INHALE 2 PUFFS INTO THE LUNGS TWICE DAILY    ? Cholecalciferol (VITAMIN D PO) Take 1,000 Units by mouth daily.     ? cloNIDine (CATAPRES) 0.2 MG tablet Take 0.2 mg by mouth 2 (two) times daily.    ? diclofenac (VOLTAREN) 75 MG EC tablet Take 75 mg by mouth 2 (two) times daily.    ? diclofenac Sodium (VOLTAREN) 1 % GEL diclofenac 1 % topical gel ? APPLY TOPICALLY TO THE AFFECTED AREA FOUR TIMES DAILY AS NEEDED    ? EPINEPHrine (EPIPEN) 0.3 mg/0.3 mL SOAJ Inject 0.3 mLs (0.3 mg total) into the muscle once. 1 Device 5  ? estradiol (ESTRACE) 2 MG tablet Take 2 mg by mouth daily.    ? famotidine (PEPCID) 20 MG tablet Take 1 tablet (20 mg total) by mouth daily. 90 tablet 3  ? fexofenadine (ALLEGRA) 180 MG tablet Take 180 mg by mouth daily.    ? Magnesium 500 MG CAPS Take 1,000 mg by mouth daily.     ? Melatonin 10 MG CAPS Take by mouth.    ? Multiple Vitamins-Minerals (ONE DAILY MENS 50+ MULTIVIT PO) Take 1 tablet by mouth daily.    ? omeprazole (PRILOSEC) 40 MG capsule Take 1 capsule (40 mg total) by mouth 2 (two) times  daily. 180 capsule 3  ? OnabotulinumtoxinA, Cosmetic, (BOTOX COSMETIC IM) Inject into the muscle every 3 (three) months.    ? ondansetron (ZOFRAN ODT) 4 MG disintegrating tablet Take 1 tablet (4 mg total) by mouth 3 (three) times daily as needed for nausea or vomiting (and vomiting). Place one tablet under tongue 1 hour before  colonoscopy prep and q 6 hours as needed. 60 tablet 1  ? SUMAtriptan (IMITREX) 100 MG tablet Take 100 mg by mouth every 2 (two) hours as needed for migraine.    ? SUMAtriptan (TOSYMRA) 10 MG/ACT SOLN Tosymra 10 mg/actuation nasal spray ? USE 1 SPRAY NASALLY AS NEEDED FOR 30 DAYS    ? testosterone cypionate (DEPOTESTOTERONE CYPIONATE) 100 MG/ML injection Inject 100 mg into the muscle every 8 (eight) weeks.    ? topiramate (TOPAMAX) 100 MG tablet Take 100 mg by mouth as needed.    ? vitamin C (ASCORBIC ACID) 500 MG tablet Take 500 mg by mouth 2 (two) times daily.    ? ?No current facility-administered medications for this visit.  ?She takes Topamax 200 mg daily. ? ?PHYSICAL EXAMINATION: ?ECOG PERFORMANCE STATUS: 1 - Symptomatic but completely ambulatory ? ?Vitals:  ? 08/02/21 0900  ?BP: 133/85  ?Pulse: 76  ?Resp: 16  ?Temp: 97.7 ?F (36.5 ?C)  ?SpO2: 99%  ? ?Filed Weights  ? 08/02/21 0900  ?Weight: 130 lb 1.6 oz (59 kg)  ? ? ?Physical Exam ?Constitutional:   ?   Appearance: Normal appearance.  ?HENT:  ?   Head: Normocephalic and atraumatic.  ?Cardiovascular:  ?   Rate and Rhythm: Normal rate and regular rhythm.  ?Pulmonary:  ?   Effort: Pulmonary effort is normal.  ?   Breath sounds: Normal breath sounds.  ?Abdominal:  ?   General: Abdomen is flat.  ?   Palpations: Abdomen is soft.  ?Musculoskeletal:     ?   General: No swelling.  ?   Cervical back: Normal range of motion and neck supple.  ?Skin: ?   General: Skin is warm and dry.  ?Neurological:  ?   General: No focal deficit present.  ?   Mental Status: She is alert.  ?Psychiatric:     ?   Mood and Affect: Mood normal.  ? ? ? ?LABORATORY DATA:   ?I have reviewed the data as listed ?Lab Results  ?Component Value Date  ? WBC 6.7 08/02/2021  ? HGB 13.8 08/02/2021  ? HCT 38.5 08/02/2021  ? MCV 95.1 08/02/2021  ? PLT 243 08/02/2021  ? ?  Chemistry

## 2021-08-02 NOTE — Progress Notes (Signed)
[place] ?FOLLOW UP NOTE ? ?Patient Care Team: ?Randa Evens as PCP - General (Physician Assistant) ? ?CHIEF COMPLAINTS/PURPOSE OF CONSULTATION:  ?Follow up on IDA. ? ?ASSESSMENT & PLAN:  ?IRON DEFICIENCY ?This is a pleasant 51 yr old female patient with PMH significant for HTN, dyslipidemia, referred to hematology for evaluation of IDA. ?She last received iron in September 2022.  She is here for follow-up.  She denies any new health complaints except for chronic fatigue.  She had CBC today which showed hemoglobin of 13.8 g/dL.  No concerning physical examination findings.  At this time I do not believe she has any needs for iron infusion or blood transfusion.  She can return to clinic in 6 months with repeat labs.  She was also encouraged to make sure that we received her labs from her PCP for review. ? ?Orders Placed This Encounter  ?Procedures  ? CBC with Differential/Platelet  ?  Standing Status:   Standing  ?  Number of Occurrences:   53  ?  Standing Expiration Date:   08/03/2022  ? Iron and Iron Binding Capacity ([facility] only)  ?  Standing Status:   Future  ?  Standing Expiration Date:   08/03/2022  ? Ferritin  ?  Standing Status:   Future  ?  Standing Expiration Date:   08/02/2022  ? ?Last colonoscopy in December 2021 according to the patient ? ?HISTORY OF PRESENTING ILLNESS:  ?Connie Murray 51 y.o. female is here because of IDA ? ?INTERIM HISTORY ? ?Connie Murray is here for a follow-up  ? ?She denies any new health complaints except for lack of energy.  She was hoping if she can get the second infusion of Feraheme that was initially planned.  She cannot tolerate oral iron.  She has quit smoking and she is very proud of it. ?She denies any change in her bowel habits or hematochezia or melena.  She continues on testosterone and estradiol supplementation, she understands the increased risk of breast cancer.  She had a mammogram in May which was benign. ?Rest of the pertinent 10 point ROS  reviewed and negative. ? ?MEDICAL HISTORY:  ?Past Medical History:  ?Diagnosis Date  ? Allergy   ? Anxiety   ? Asthma   ? stress related, perfumes, dyes and certain flowers   ? Bipolar disorder ([place])   ? Breast mass, right 12/30/2016  ? Cataract   ? removed with lens implants bilat   ? Cervical spondylosis   ? Chronic headaches   ? Chronic lower back pain   ? Degenerative arthritis   ? Depression   ? Dyslipidemia   ? Fibromyalgia   ? GERD (gastroesophageal reflux disease)   ? Heart murmur   ? cildhood heart murmur  ? Hyperlipidemia   ? Hypertension   ? Migraine   ? Myalgia and myositis, unspecified 11/27/2012  ? Normocytic anemia   ? Palpitations   ? Pneumonia   ? Radiculopathy   ? C8 or T1  ? ? ?SURGICAL HISTORY: ?Past Surgical History:  ?Procedure Laterality Date  ? ABDOMINAL HYSTERECTOMY    ? BREAST BIOPSY Right 01/19/2016  ? BREAST BIOPSY Right 01/19/2016  ? BREAST LUMPECTOMY WITH RADIOACTIVE SEED LOCALIZATION Right 12/30/2016  ? Procedure: RIGHT BREAST LUMPECTOMY WITH RADIOACTIVE SEED LOCALIZATION;  Surgeon: Fanny Skates, MD;  Location: [place];  Service: General;  Laterality: Right;  ERAS PATHWAY  ? Broken Foot Right   ? COLONOSCOPY    ? dental    ?  3 extractions February 2019  ? DENTAL RESTORATION/EXTRACTION WITH X-RAY    ? 9 total  ? EYE SURGERY    ? bilateral cataract removal with lens placement June & July 2020  ? NASAL SINUS SURGERY    ? TOTAL ABDOMINAL HYSTERECTOMY W/ BILATERAL SALPINGOOPHORECTOMY  01/2010  ? UPPER GASTROINTESTINAL ENDOSCOPY    ? VENTRAL HERNIA REPAIR N/A 03/19/2019  ? Procedure: LAPAROSCOPIC INCARCERATED [place] HERNIA REPAIR With MESH;  Surgeon: Fanny Skates, MD;  Location: [place];  Service: General;  Laterality: N/A;  ? ? ?SOCIAL HISTORY: ?Social History  ? ?Socioeconomic History  ? Marital status: Single  ?  Spouse name: Not on file  ? Number of children: 1  ? Years of education: Not on file  ? Highest education level: Not on file  ?Occupational History  ?  Occupation: Disabled  ?  Employer: DISABLED  ?Tobacco Use  ? Smoking status: Every Day  ?  Packs/day: 1.00  ?  Years: 28.00  ?  Pack years: 28.00  ?  Types: Cigarettes  ?  Start date: 12/13/1982  ? Smokeless tobacco: Never  ? Tobacco comments:  ?  between 1/2 - 1 pack cigs per day  ?Vaping Use  ? Vaping Use: Former  ?Substance and Sexual Activity  ? Alcohol use: No  ? Drug use: No  ? Sexual activity: Not on file  ?Other Topics Concern  ? Not on file  ?Social History Narrative  ? Not on file  ? ?Social Determinants of Health  ? ?Financial Resource Strain: Not on file  ?Food Insecurity: Not on file  ?Transportation Needs: Not on file  ?Physical Activity: Not on file  ?Stress: Not on file  ?Social Connections: Not on file  ?Intimate Partner Violence: Not on file  ? ? ?FAMILY HISTORY: ?Family History  ?Problem Relation Age of Onset  ? Ovarian cancer Mother   ? Colon polyps Mother   ? Alcoholism Father   ? Irritable bowel syndrome Other   ? Bone cancer Maternal Uncle   ? Breast cancer Paternal Grandmother   ? Brain cancer Maternal Uncle   ? Colon cancer Neg Hx   ? Esophageal cancer Neg Hx   ? Pancreatic cancer Neg Hx   ? Rectal cancer Neg Hx   ? Stomach cancer Neg Hx   ? Prostate cancer Neg Hx   ? ? ?ALLERGIES:  is allergic to bee venom, cephalexin, chlorhexidine, clindamycin/lincomycin, molds & smuts, nsaids, statins, acetaminophen, codeine, darvocet [propoxyphene n-acetaminophen], latex, red dye, and tetracyclines & related. ? ?MEDICATIONS:  ?Current Outpatient Medications  ?Medication Sig Dispense Refill  ? Erenumab-aooe 70 MG/ML SOAJ Inject 140 mg into the skin every 30 (thirty) days.    ? ALPRAZolam (XANAX) 1 MG tablet Take 1 mg by mouth 2 (two) times daily as needed for anxiety.    ? B Complex-C (B-COMPLEX WITH VITAMIN C) tablet Take 2 tablets by mouth daily.     ? Bacillus Coagulans-Inulin (PROBIOTIC) 1-250 BILLION-MG CAPS Take by mouth.    ? budesonide-formoterol (SYMBICORT) 80-4.5 MCG/ACT inhaler Symbicort 80  mcg-4.5 mcg/actuation HFA aerosol inhaler ? INHALE 2 PUFFS INTO THE LUNGS TWICE DAILY    ? Cholecalciferol (VITAMIN D PO) Take 1,000 Units by mouth daily.     ? cloNIDine (CATAPRES) 0.2 MG tablet Take 0.2 mg by mouth 2 (two) times daily.    ? diclofenac (VOLTAREN) 75 MG EC tablet Take 75 mg by mouth 2 (two) times daily.    ? diclofenac Sodium (VOLTAREN) 1 %  GEL diclofenac 1 % topical gel ? APPLY TOPICALLY TO THE AFFECTED AREA FOUR TIMES DAILY AS NEEDED    ? EPINEPHrine (EPIPEN) 0.3 mg/0.3 mL SOAJ Inject 0.3 mLs (0.3 mg total) into the muscle once. 1 Device 5  ? estradiol (ESTRACE) 2 MG tablet Take 2 mg by mouth daily.    ? famotidine (PEPCID) 20 MG tablet Take 1 tablet (20 mg total) by mouth daily. 90 tablet 3  ? fexofenadine (ALLEGRA) 180 MG tablet Take 180 mg by mouth daily.    ? Magnesium 500 MG CAPS Take 1,000 mg by mouth daily.     ? Melatonin 10 MG CAPS Take by mouth.    ? Multiple Vitamins-Minerals (ONE DAILY MENS 50+ MULTIVIT PO) Take 1 tablet by mouth daily.    ? omeprazole (PRILOSEC) 40 MG capsule Take 1 capsule (40 mg total) by mouth 2 (two) times daily. 180 capsule 3  ? OnabotulinumtoxinA, Cosmetic, (BOTOX COSMETIC IM) Inject into the muscle every 3 (three) months.    ? ondansetron (ZOFRAN ODT) 4 MG disintegrating tablet Take 1 tablet (4 mg total) by mouth 3 (three) times daily as needed for nausea or vomiting (and vomiting). Place one tablet under tongue 1 hour before colonoscopy prep and q 6 hours as needed. 60 tablet 1  ? SUMAtriptan (IMITREX) 100 MG tablet Take 100 mg by mouth every 2 (two) hours as needed for migraine.    ? SUMAtriptan (TOSYMRA) 10 MG/ACT SOLN Tosymra 10 mg/actuation nasal spray ? USE 1 SPRAY NASALLY AS NEEDED FOR 30 DAYS    ? testosterone cypionate (DEPOTESTOTERONE CYPIONATE) 100 MG/ML injection Inject 100 mg into the muscle every 8 (eight) weeks.    ? topiramate (TOPAMAX) 100 MG tablet Take 100 mg by mouth as needed.    ? vitamin C (ASCORBIC ACID) 500 MG tablet Take 500 mg by  mouth 2 (two) times daily.    ? ?No current facility-administered medications for this visit.  ?She takes Topamax 200 mg daily. ? ?PHYSICAL EXAMINATION: ?ECOG PERFORMANCE STATUS: 1 - Symptomatic but completely ambulato

## 2021-08-02 NOTE — Assessment & Plan Note (Signed)
This is a pleasant 51 yr old female patient with PMH significant for HTN, dyslipidemia, referred to hematology for evaluation of IDA. ?She last received iron in September 2022.  She is here for follow-up.  She denies any new health complaints except for chronic fatigue.  She had CBC today which showed hemoglobin of 13.8 g/dL.  No concerning physical examination findings.  At this time I do not believe she has any needs for iron infusion or blood transfusion.  She can return to clinic in 6 months with repeat labs.  She was also encouraged to make sure that we received her labs from her PCP for review. ?

## 2021-10-04 ENCOUNTER — Other Ambulatory Visit: Payer: Self-pay | Admitting: Obstetrics & Gynecology

## 2021-10-04 DIAGNOSIS — Z1231 Encounter for screening mammogram for malignant neoplasm of breast: Secondary | ICD-10-CM

## 2021-10-12 ENCOUNTER — Ambulatory Visit
Admission: RE | Admit: 2021-10-12 | Discharge: 2021-10-12 | Disposition: A | Discharge status: Home / Self Care | Payer: Medicare Other | Source: Ambulatory Visit | Attending: Obstetrics & Gynecology | Admitting: Obstetrics & Gynecology

## 2021-10-12 DIAGNOSIS — Z1231 Encounter for screening mammogram for malignant neoplasm of breast: Secondary | ICD-10-CM

## 2021-10-17 ENCOUNTER — Other Ambulatory Visit: Payer: Self-pay | Admitting: Obstetrics & Gynecology

## 2021-10-17 DIAGNOSIS — R928 Other abnormal and inconclusive findings on diagnostic imaging of breast: Secondary | ICD-10-CM

## 2021-10-25 ENCOUNTER — Ambulatory Visit
Admission: RE | Admit: 2021-10-25 | Discharge: 2021-10-25 | Disposition: A | Discharge status: Home / Self Care | Payer: Medicare Other | Source: Ambulatory Visit | Attending: Obstetrics & Gynecology | Admitting: Obstetrics & Gynecology

## 2021-10-25 ENCOUNTER — Other Ambulatory Visit: Payer: Self-pay | Admitting: Obstetrics & Gynecology

## 2021-10-25 DIAGNOSIS — R928 Other abnormal and inconclusive findings on diagnostic imaging of breast: Secondary | ICD-10-CM

## 2021-10-25 DIAGNOSIS — R921 Mammographic calcification found on diagnostic imaging of breast: Secondary | ICD-10-CM

## 2021-10-26 ENCOUNTER — Encounter (HOSPITAL_COMMUNITY): Payer: Self-pay | Admitting: Emergency Medicine

## 2021-10-26 ENCOUNTER — Ambulatory Visit (HOSPITAL_COMMUNITY)
Admission: EM | Admit: 2021-10-26 | Discharge: 2021-10-26 | Disposition: A | Discharge status: Home / Self Care | Payer: Medicare Other | Attending: Emergency Medicine | Admitting: Emergency Medicine

## 2021-10-26 DIAGNOSIS — S01111A Laceration without foreign body of right eyelid and periocular area, initial encounter: Secondary | ICD-10-CM

## 2021-10-26 DIAGNOSIS — Z23 Encounter for immunization: Secondary | ICD-10-CM | POA: Diagnosis not present

## 2021-10-26 MED ORDER — TETANUS-DIPHTH-ACELL PERTUSSIS 5-2.5-18.5 LF-MCG/0.5 IM SUSY
0.5000 mL | PREFILLED_SYRINGE | Freq: Once | INTRAMUSCULAR | Status: AC
  Administered 2021-10-26: 0.5 mL via INTRAMUSCULAR

## 2021-10-26 MED ORDER — TETANUS-DIPHTH-ACELL PERTUSSIS 5-2.5-18.5 LF-MCG/0.5 IM SUSY
PREFILLED_SYRINGE | INTRAMUSCULAR | Status: AC
  Filled 2021-10-26: qty 0.5

## 2021-10-26 NOTE — ED Provider Notes (Signed)
[facility]    CSN: [id number] Arrival date & time: 10/26/21  1015      History   Chief Complaint Chief Complaint  Patient presents with   Facial Laceration    HPI Connie Murray is a 51 y.o. female.   Patient presents with laceration to the right eyelid beginning today at approximately 7 AM.  Patient was scratched by her pet cat who is up-to-date on all vaccinations.  Bleeding has subsided but has been reoccurring periodically as patient endorses with eye movement wound reopens.  Has scratch to the left cheek and left forehead as well.  Denies blurred vision, floaters, light sensitivity.  Past Medical History:  Diagnosis Date   Allergy    Anxiety    Asthma    stress related, perfumes, dyes and certain flowers    Bipolar disorder (HCC)    Breast mass, right 12/30/2016   Cataract    removed with lens implants bilat    Cervical spondylosis    Chronic headaches    Chronic lower back pain    Degenerative arthritis    Depression    Dyslipidemia    Fibromyalgia    GERD (gastroesophageal reflux disease)    Heart murmur    cildhood heart murmur   Hyperlipidemia    Hypertension    Migraine    Myalgia and myositis, unspecified 11/27/2012   Normocytic anemia    Palpitations    Pneumonia    Radiculopathy    C8 or T1    Patient Active Problem List   Diagnosis Date Noted   Incarcerated epigastric hernia 03/19/2019   Breast mass, right 12/30/2016   Back pain, chronic 12/09/2012   Bipolar 1 disorder, mixed, moderate (HCC) 12/09/2012   Asthma, chronic 12/09/2012   Fibromyalgia 12/09/2012   Migraine, unspecified, 12/09/2012   Myalgia and myositis, unspecified 11/27/2012   Ventral hernia-supraumbilical 09/23/2012   IRON DEFICIENCY 04/18/2010   UNSPECIFIED ANEMIA 04/18/2010   BLOOD IN STOOL 04/18/2010   GERD 01/06/2008   CONSTIPATION 01/06/2008    Past Surgical History:  Procedure Laterality Date   ABDOMINAL HYSTERECTOMY     BREAST BIOPSY Right 01/19/2016    BREAST BIOPSY Right 01/19/2016   BREAST LUMPECTOMY WITH RADIOACTIVE SEED LOCALIZATION Right 12/30/2016   Procedure: RIGHT BREAST LUMPECTOMY WITH RADIOACTIVE SEED LOCALIZATION;  Surgeon: Claud Kelp, MD;  Location: [facility];  Service: General;  Laterality: Right;  ERAS PATHWAY   Broken Foot Right    COLONOSCOPY     dental     3 extractions February 2019   DENTAL RESTORATION/EXTRACTION WITH X-RAY     9 total   EYE SURGERY     bilateral cataract removal with lens placement June & July 2020   NASAL SINUS SURGERY     TOTAL ABDOMINAL HYSTERECTOMY W/ BILATERAL SALPINGOOPHORECTOMY  01/2010   UPPER GASTROINTESTINAL ENDOSCOPY     VENTRAL HERNIA REPAIR N/A 03/19/2019   Procedure: LAPAROSCOPIC INCARCERATED EPIGASTIRC HERNIA REPAIR With MESH;  Surgeon: Claud Kelp, MD;  Location: [facility];  Service: General;  Laterality: N/A;    OB History   No obstetric history on file.      Home Medications    Prior to Admission medications   Medication Sig Start Date End Date Taking? Authorizing Provider  ALPRAZolam Prudy Feeler) 1 MG tablet Take 1 mg by mouth 2 (two) times daily as needed for anxiety.    [provider]  B Complex-C (B-COMPLEX WITH VITAMIN C) tablet Take 2 tablets by mouth daily.  [provider]  Bacillus Coagulans-Inulin (PROBIOTIC) 1-250 BILLION-MG CAPS Take by mouth.    [provider]  budesonide-formoterol (SYMBICORT) 80-4.5 MCG/ACT inhaler Symbicort 80 mcg-4.5 mcg/actuation HFA aerosol inhaler  INHALE 2 PUFFS INTO THE LUNGS TWICE DAILY    [provider]  Cholecalciferol (VITAMIN D PO) Take 1,000 Units by mouth daily.     [provider]  cloNIDine (CATAPRES) 0.2 MG tablet Take 0.2 mg by mouth 2 (two) times daily. 02/12/21   [provider]  diclofenac (VOLTAREN) 75 MG EC tablet Take 75 mg by mouth 2 (two) times daily. 04/18/20   [provider]  diclofenac Sodium (VOLTAREN) 1 % GEL diclofenac 1 % topical  gel  APPLY TOPICALLY TO THE AFFECTED AREA FOUR TIMES DAILY AS NEEDED    [provider]  EPINEPHrine (EPIPEN) 0.3 mg/0.3 mL SOAJ Inject 0.3 mLs (0.3 mg total) into the muscle once. 12/09/12   Rai, Ripudeep K, MD  Erenumab-aooe 70 MG/ML SOAJ Inject 140 mg into the skin every 30 (thirty) days.    [provider]  estradiol (ESTRACE) 2 MG tablet Take 2 mg by mouth daily.    [provider]  famotidine (PEPCID) 20 MG tablet Take 1 tablet (20 mg total) by mouth daily. 04/30/21   Meryl DareStark, Malcolm T, MD  fexofenadine (ALLEGRA) 180 MG tablet Take 180 mg by mouth daily.    [provider]  Magnesium 500 MG CAPS Take 1,000 mg by mouth daily.     [provider]  Melatonin 10 MG CAPS Take by mouth.    [provider]  Multiple Vitamins-Minerals (ONE DAILY MENS 50+ MULTIVIT PO) Take 1 tablet by mouth daily.    [provider]  omeprazole (PRILOSEC) 40 MG capsule Take 1 capsule (40 mg total) by mouth 2 (two) times daily. 04/30/21   Meryl DareStark, Malcolm T, MD  OnabotulinumtoxinA, Cosmetic, (BOTOX COSMETIC IM) Inject into the muscle every 3 (three) months.    [provider]  ondansetron (ZOFRAN ODT) 4 MG disintegrating tablet Take 1 tablet (4 mg total) by mouth 3 (three) times daily as needed for nausea or vomiting (and vomiting). Place one tablet under tongue 1 hour before colonoscopy prep and q 6 hours as needed. 04/27/20   Meryl DareStark, Malcolm T, MD  SUMAtriptan (IMITREX) 100 MG tablet Take 100 mg by mouth every 2 (two) hours as needed for migraine. 08/13/13   [provider]  SUMAtriptan (TOSYMRA) 10 MG/ACT SOLN Tosymra 10 mg/actuation nasal spray  USE 1 SPRAY NASALLY AS NEEDED FOR 30 DAYS    [provider]  testosterone cypionate (DEPOTESTOTERONE CYPIONATE) 100 MG/ML injection Inject 100 mg into the muscle every 8 (eight) weeks.    [provider]  topiramate (TOPAMAX) 100 MG tablet Take 100 mg by mouth as needed. 03/12/21    [provider]  vitamin C (ASCORBIC ACID) 500 MG tablet Take 500 mg by mouth 2 (two) times daily.    [provider]    Family History Family History  Problem Relation Age of Onset   Ovarian cancer Mother    Colon polyps Mother    Alcoholism Father    Irritable bowel syndrome Other    Bone cancer Maternal Uncle    Breast cancer Paternal Grandmother    Brain cancer Maternal Uncle    Colon cancer Neg Hx    Esophageal cancer Neg Hx    Pancreatic cancer Neg Hx    Rectal cancer Neg Hx    Stomach cancer  Neg Hx    Prostate cancer Neg Hx     Social History Social History   Tobacco Use   Smoking status: Every Day    Packs/day: 1.00    Years: 28.00    Total pack years: 28.00    Types: Cigarettes    Start date: 12/13/1982   Smokeless tobacco: Never   Tobacco comments:    between 1/2 - 1 pack cigs per day  Vaping Use   Vaping Use: Former  Substance Use Topics   Alcohol use: No   Drug use: No     Allergies   Bee venom, Cephalexin, Chlorhexidine, Clindamycin/lincomycin, Molds & smuts, Nsaids, Statins, Acetaminophen, Codeine, Darvocet [propoxyphene n-acetaminophen], Latex, Red dye, and Tetracyclines & related   Review of Systems Review of Systems   Physical Exam Triage Vital Signs ED Triage Vitals  Enc Vitals Group     BP 10/26/21 1034 (!) 144/92     Pulse Rate 10/26/21 1034 84     Resp 10/26/21 1034 19     Temp 10/26/21 1034 98 F (36.7 C)     Temp Source 10/26/21 1034 Oral     SpO2 10/26/21 1034 96 %     Weight --      Height --      Head Circumference --      Peak Flow --      Pain Score 10/26/21 1033 1     Pain Loc --      Pain Edu? --      Excl. in GC? --    No data found.  Updated Vital Signs BP (!) 144/92 (BP Location: Right Arm)   Pulse 84   Temp 98 F (36.7 C) (Oral)   Resp 19   SpO2 96%   Visual Acuity Right Eye Distance:   Left Eye Distance:   Bilateral Distance:    Right Eye Near:   Left Eye Near:    Bilateral  Near:     Physical Exam   UC Treatments / Results  Labs (all labs ordered are listed, but only abnormal results are displayed) Labs Reviewed - No data to display  EKG   Radiology MM Digital Diagnostic Unilat R  Result Date: 10/25/2021 CLINICAL DATA:  51 year old female for further evaluation of RIGHT breast calcifications identified on screening mammogram. History of multiple benign breast abnormalities and benign RIGHT breast surgery. EXAM: DIGITAL DIAGNOSTIC UNILATERAL RIGHT MAMMOGRAM TECHNIQUE: Right digital diagnostic mammography was performed. Mammographic images were processed with CAD. COMPARISON:  Previous exam(s). ACR Breast Density Category c: The breast tissue is heterogeneously dense, which may obscure small masses. FINDINGS: Full field and magnification views of the RIGHT breast demonstrate a 1 cm group of slightly heterogeneous round and amorphous calcifications within the posterior UPPER OUTER RIGHT breast. IMPRESSION: 1 cm group of indeterminate calcifications within the posterior UPPER OUTER RIGHT breast. Tissue sampling is recommended. RECOMMENDATION: 3D/stereotactic guided RIGHT breast biopsy, which will be scheduled. I have discussed the findings and recommendations with the patient. If applicable, a reminder letter will be sent to the patient regarding the next appointment. BI-RADS CATEGORY  4: Suspicious. Electronically Signed   By: Harmon Pier M.D.   On: 10/25/2021 12:42   Procedures Procedures (including critical care time)  Medications Ordered in UC Medications  Tdap (BOOSTRIX) injection 0.5 mL (0.5 mLs Intramuscular Given 10/26/21 1131)    Initial Impression / Assessment and Plan / UC Course  I have reviewed the triage vital signs and the nursing notes.  Pertinent labs & imaging results that were available during my care of the patient were reviewed by me and considered in my medical decision making (see chart for details).  Right eyelid laceration, initial  encounter  Bleeding began to subside with some breakthrough due to natural clotting of the body, vision is grossly intact, extraocular movements intact, due to placement of laceration patient is being sent to the nearest emergency department for further management and evaluation Final Clinical Impressions(s) / UC Diagnoses   Final diagnoses:  Right eyelid laceration, initial encounter     Discharge Instructions      We have  24 hours to close the laceration to your eyelid if necessary, to find the most appropriate care for you to you will need to go to the nearest emergency department for further evaluation and management   ED Prescriptions   None    PDMP not reviewed this encounter.   Valinda Hoar, NP 10/26/21 1133

## 2021-10-26 NOTE — ED Triage Notes (Signed)
Pt reports that her cat jumped across her and back paw scratched her in the face. Pt has laceration to right eye lid. Denies blurred vision. Reports her eyes were closed when cat came across face. Reports last tetanus shot 20 years ago.  Reports cats shots up to date.

## 2021-10-26 NOTE — Discharge Instructions (Signed)
We have  24 hours to close the laceration to your eyelid if necessary, to find the most appropriate care for you to you will need to go to the nearest emergency department for further evaluation and management

## 2021-11-05 ENCOUNTER — Ambulatory Visit
Admission: RE | Admit: 2021-11-05 | Discharge: 2021-11-05 | Disposition: A | Discharge status: Home / Self Care | Payer: Medicare Other | Source: Ambulatory Visit | Attending: Obstetrics & Gynecology | Admitting: Obstetrics & Gynecology

## 2021-11-05 DIAGNOSIS — R921 Mammographic calcification found on diagnostic imaging of breast: Secondary | ICD-10-CM

## 2022-02-01 ENCOUNTER — Other Ambulatory Visit: Payer: Self-pay

## 2022-02-01 ENCOUNTER — Inpatient Hospital Stay: Payer: Medicare Other | Attending: Hematology and Oncology | Admitting: Hematology and Oncology

## 2022-02-01 ENCOUNTER — Inpatient Hospital Stay: Payer: Medicare Other

## 2022-02-01 ENCOUNTER — Encounter: Payer: Self-pay | Admitting: Hematology and Oncology

## 2022-02-01 VITALS — BP 125/87 | HR 81 | Temp 98.4°F | Resp 19 | Wt 124.1 lb

## 2022-02-01 DIAGNOSIS — D509 Iron deficiency anemia, unspecified: Secondary | ICD-10-CM

## 2022-02-01 DIAGNOSIS — F17210 Nicotine dependence, cigarettes, uncomplicated: Secondary | ICD-10-CM | POA: Insufficient documentation

## 2022-02-01 LAB — IRON AND IRON BINDING CAPACITY (CC-WL,HP ONLY)
Iron: 193 ug/dL — ABNORMAL HIGH (ref 28–170)
Saturation Ratios: 61 % — ABNORMAL HIGH (ref 10.4–31.8)
TIBC: 315 ug/dL (ref 250–450)
UIBC: 122 ug/dL

## 2022-02-01 LAB — CBC WITH DIFFERENTIAL/PLATELET
Abs Immature Granulocytes: 0.02 10*3/uL (ref 0.00–0.07)
Basophils Absolute: 0.1 10*3/uL (ref 0.0–0.1)
Basophils Relative: 1 %
Eosinophils Absolute: 0.1 10*3/uL (ref 0.0–0.5)
Eosinophils Relative: 2 %
HCT: 38.1 % (ref 36.0–46.0)
Hemoglobin: 13.8 g/dL (ref 12.0–15.0)
Immature Granulocytes: 0 %
Lymphocytes Relative: 28 %
Lymphs Abs: 2.2 10*3/uL (ref 0.7–4.0)
MCH: 33.5 pg (ref 26.0–34.0)
MCHC: 36.2 g/dL — ABNORMAL HIGH (ref 30.0–36.0)
MCV: 92.5 fL (ref 80.0–100.0)
Monocytes Absolute: 0.7 10*3/uL (ref 0.1–1.0)
Monocytes Relative: 9 %
Neutro Abs: 4.8 10*3/uL (ref 1.7–7.7)
Neutrophils Relative %: 60 %
Platelets: 265 10*3/uL (ref 150–400)
RBC: 4.12 MIL/uL (ref 3.87–5.11)
RDW: 11.6 % (ref 11.5–15.5)
WBC: 7.9 10*3/uL (ref 4.0–10.5)
nRBC: 0 % (ref 0.0–0.2)

## 2022-02-01 LAB — FERRITIN: Ferritin: 39 ng/mL (ref 11–307)

## 2022-02-01 NOTE — Assessment & Plan Note (Addendum)
This is a pleasant 51 yr old female patient with PMH significant for HTN, dyslipidemia, referred to hematology for evaluation of IDA. She last received iron in September 2022.  She is here for follow-up.  She denies any new health complaints, feeling very well. She had CBC today which showed hemoglobin of 13.8 g/dL.  Physical examination today unremarkable.  At this time I do not believe she has any needs for iron infusion or blood transfusion.  She can return to clinic in 1 year or as needed.  With regards to intermittent blood in stool, I recommended that she strongly discussed this with her gastroenterologist and she expressed understanding.  Iron panel which resulted so far does not seem to show any evidence of iron deficiency.  I applauded her diet and exercise regimen.

## 2022-02-01 NOTE — Progress Notes (Signed)
[name] FOLLOW UP NOTE  Patient Care Team: [name], [name] as PCP - General (Physician Assistant)  CHIEF COMPLAINTS/PURPOSE OF CONSULTATION:  Follow up on IDA.  ASSESSMENT & PLAN:  IRON DEFICIENCY This is a pleasant 51 yr old female patient with PMH significant for HTN, dyslipidemia, referred to hematology for evaluation of IDA. She last received iron in September 2022.  She is here for follow-up.  She denies any new health complaints, feeling very well. She had CBC today which showed hemoglobin of 13.8 g/dL.  Physical examination today unremarkable.  At this time I do not believe she has any needs for iron infusion or blood transfusion.  She can return to clinic in 1 year or as needed.  With regards to intermittent blood in stool, I recommended that she strongly discussed this with her gastroenterologist and she expressed understanding.  Iron panel which resulted so far does not seem to show any evidence of iron deficiency.  I applauded her diet and exercise regimen.  Orders Placed This Encounter  Procedures   CBC with Differential/Platelet    Standing Status:   Standing    Number of Occurrences:   22    Standing Expiration Date:   02/02/2023   Iron and Iron Binding Capacity ([facility] only)    Standing Status:   Future    Number of Occurrences:   1    Standing Expiration Date:   02/02/2023   Ferritin    Standing Status:   Future    Number of Occurrences:   1    Standing Expiration Date:   02/01/2023   Last colonoscopy in December 2021 according to the patient  HISTORY OF PRESENTING ILLNESS:  Connie Murray 51 y.o. female is here because of IDA  INTERIM HISTORY  Connie Murray is here for a follow-up  Since last visit, patient denies any new health complaints.  She has been feeling really well, has been following a good exercise and diet regimen.  She reports some very scant blood in stool from time to time which she has followed up with gastroenterology.  She  otherwise denies any blood loss per rectum. No interim hospitalizations reported Rest of the pertinent 10 point ROS reviewed and negative.  MEDICAL HISTORY:  Past Medical History:  Diagnosis Date   Allergy    Anxiety    Asthma    stress related, perfumes, dyes and certain flowers    Bipolar disorder (HCC)    Breast mass, right 12/30/2016   Cataract    removed with lens implants bilat    Cervical spondylosis    Chronic headaches    Chronic lower back pain    Degenerative arthritis    Depression    Dyslipidemia    Fibromyalgia    GERD (gastroesophageal reflux disease)    Heart murmur    cildhood heart murmur   Hyperlipidemia    Hypertension    Migraine    Myalgia and myositis, unspecified 11/27/2012   Normocytic anemia    Palpitations    Pneumonia    Radiculopathy    C8 or T1    SURGICAL HISTORY: Past Surgical History:  Procedure Laterality Date   ABDOMINAL HYSTERECTOMY     BREAST BIOPSY Right 01/19/2016   BREAST BIOPSY Right 01/19/2016   BREAST LUMPECTOMY WITH RADIOACTIVE SEED LOCALIZATION Right 12/30/2016   Procedure: RIGHT BREAST LUMPECTOMY WITH RADIOACTIVE SEED LOCALIZATION;  Surgeon: Claud Kelp, MD;  Location: [facility];  Service: General;  Laterality: Right;  ERAS PATHWAY   Broken Foot Right    COLONOSCOPY     dental     3 extractions February 2019   DENTAL RESTORATION/EXTRACTION WITH X-RAY     9 total   EYE SURGERY     bilateral cataract removal with lens placement June & July 2020   NASAL SINUS SURGERY     TOTAL ABDOMINAL HYSTERECTOMY W/ BILATERAL SALPINGOOPHORECTOMY  01/2010   UPPER GASTROINTESTINAL ENDOSCOPY     VENTRAL HERNIA REPAIR N/A 03/19/2019   Procedure: LAPAROSCOPIC INCARCERATED EPIGASTIRC HERNIA REPAIR With MESH;  Surgeon: Claud Kelp, MD;  Location: [facility];  Service: General;  Laterality: N/A;    SOCIAL HISTORY: Social History   Socioeconomic History   Marital status: Single    Spouse name: Not on file   Number of  children: 1   Years of education: Not on file   Highest education level: Not on file  Occupational History   Occupation: Disabled    Employer: DISABLED  Tobacco Use   Smoking status: Every Day    Packs/day: 1.00    Years: 28.00    Total pack years: 28.00    Types: Cigarettes    Start date: 12/13/1982   Smokeless tobacco: Never   Tobacco comments:    between 1/2 - 1 pack cigs per day  Vaping Use   Vaping Use: Former  Substance and Sexual Activity   Alcohol use: No   Drug use: No   Sexual activity: Not on file  Other Topics Concern   Not on file  Social History Narrative   Not on file   Social Determinants of Health   Financial Resource Strain: Not on file  Food Insecurity: Not on file  Transportation Needs: Not on file  Physical Activity: Not on file  Stress: Not on file  Social Connections: Not on file  Intimate Partner Violence: Not on file    FAMILY HISTORY: Family History  Problem Relation Age of Onset   Ovarian cancer Mother    Colon polyps Mother    Alcoholism Father    Irritable bowel syndrome Other    Bone cancer Maternal Uncle    Breast cancer Paternal Grandmother    Brain cancer Maternal Uncle    Colon cancer Neg Hx    Esophageal cancer Neg Hx    Pancreatic cancer Neg Hx    Rectal cancer Neg Hx    Stomach cancer Neg Hx    Prostate cancer Neg Hx     ALLERGIES:  is allergic to bee venom, cephalexin, chlorhexidine, clindamycin/lincomycin, molds & smuts, nsaids, statins, acetaminophen, codeine, darvocet [propoxyphene n-acetaminophen], latex, red dye, and tetracyclines & related.  MEDICATIONS:  Current Outpatient Medications  Medication Sig Dispense Refill   ALPRAZolam (XANAX) 1 MG tablet Take 1 mg by mouth 2 (two) times daily as needed for anxiety.     B Complex-C (B-COMPLEX WITH VITAMIN C) tablet Take 2 tablets by mouth daily.      Bacillus Coagulans-Inulin (PROBIOTIC) 1-250 BILLION-MG CAPS Take by mouth.     budesonide-formoterol (SYMBICORT)  80-4.5 MCG/ACT inhaler Symbicort 80 mcg-4.5 mcg/actuation HFA aerosol inhaler  INHALE 2 PUFFS INTO THE LUNGS TWICE DAILY     Cholecalciferol (VITAMIN D PO) Take 1,000 Units by mouth daily.      cloNIDine (CATAPRES) 0.2 MG tablet Take 0.2 mg by mouth 2 (two) times daily.     diclofenac (VOLTAREN) 75 MG EC tablet Take 75 mg by mouth 2 (two) times daily.     diclofenac Sodium (  VOLTAREN) 1 % GEL diclofenac 1 % topical gel  APPLY TOPICALLY TO THE AFFECTED AREA FOUR TIMES DAILY AS NEEDED     EPINEPHrine (EPIPEN) 0.3 mg/0.3 mL SOAJ Inject 0.3 mLs (0.3 mg total) into the muscle once. 1 Device 5   Erenumab-aooe 70 MG/ML SOAJ Inject 140 mg into the skin every 30 (thirty) days.     estradiol (ESTRACE) 2 MG tablet Take 2 mg by mouth daily.     famotidine (PEPCID) 20 MG tablet Take 1 tablet (20 mg total) by mouth daily. 90 tablet 3   fexofenadine (ALLEGRA) 180 MG tablet Take 180 mg by mouth daily.     Magnesium 500 MG CAPS Take 1,000 mg by mouth daily.      Melatonin 10 MG CAPS Take by mouth.     Multiple Vitamins-Minerals (ONE DAILY MENS 50+ MULTIVIT PO) Take 1 tablet by mouth daily.     omeprazole (PRILOSEC) 40 MG capsule Take 1 capsule (40 mg total) by mouth 2 (two) times daily. 180 capsule 3   OnabotulinumtoxinA, Cosmetic, (BOTOX COSMETIC IM) Inject into the muscle every 3 (three) months.     ondansetron (ZOFRAN ODT) 4 MG disintegrating tablet Take 1 tablet (4 mg total) by mouth 3 (three) times daily as needed for nausea or vomiting (and vomiting). Place one tablet under tongue 1 hour before colonoscopy prep and q 6 hours as needed. 60 tablet 1   SUMAtriptan (IMITREX) 100 MG tablet Take 100 mg by mouth every 2 (two) hours as needed for migraine.     SUMAtriptan (TOSYMRA) 10 MG/ACT SOLN Tosymra 10 mg/actuation nasal spray  USE 1 SPRAY NASALLY AS NEEDED FOR 30 DAYS     testosterone cypionate (DEPOTESTOTERONE CYPIONATE) 100 MG/ML injection Inject 100 mg into the muscle every 8 (eight) weeks.     topiramate  (TOPAMAX) 100 MG tablet Take 100 mg by mouth as needed.     vitamin C (ASCORBIC ACID) 500 MG tablet Take 500 mg by mouth 2 (two) times daily.     No current facility-administered medications for this visit.  She takes Topamax 200 mg daily.  PHYSICAL EXAMINATION: ECOG PERFORMANCE STATUS: 1 - Symptomatic but completely ambulatory  Vitals:   02/01/22 1142  BP: 125/87  Pulse: 81  Resp: 19  Temp: 98.4 F (36.9 C)  SpO2: 100%   Filed Weights   02/01/22 1142  Weight: 124 lb 2 oz (56.3 kg)    Physical Exam Constitutional:      Appearance: Normal appearance.  HENT:     Head: Normocephalic and atraumatic.  Cardiovascular:     Rate and Rhythm: Normal rate and regular rhythm.  Pulmonary:     Effort: Pulmonary effort is normal.     Breath sounds: Normal breath sounds.  Abdominal:     General: Abdomen is flat.     Palpations: Abdomen is soft.  Musculoskeletal:        General: No swelling.     Cervical back: Normal range of motion and neck supple.  Skin:    General: Skin is warm and dry.  Neurological:     General: No focal deficit present.     Mental Status: She is alert.  Psychiatric:        Mood and Affect: Mood normal.      LABORATORY DATA:  I have reviewed the data as listed Lab Results  Component Value Date   WBC 7.9 02/01/2022   HGB 13.8 02/01/2022   HCT 38.1 02/01/2022   MCV 92.5  02/01/2022   PLT 265 02/01/2022     Chemistry      Component Value Date/Time   NA 140 01/31/2021 0919   K 3.8 01/31/2021 0919   CL 104 01/31/2021 0919   CO2 26 01/31/2021 0919   BUN 8 01/31/2021 0919   CREATININE 0.80 01/31/2021 0919   CREATININE 1.41 (H) 03/09/2013 1444      Component Value Date/Time   CALCIUM 8.6 (L) 01/31/2021 0919   ALKPHOS 63 01/31/2021 0919   AST 20 01/31/2021 0919   ALT 15 01/31/2021 0919   BILITOT 0.3 01/31/2021 0919    Last mammogram and pathology report reviewed, no evidence of malignancy CBC from today showed normal hemoglobin at 13.8 g/dL.   Iron panel with no evidence of iron deficiency however ferritin is pending at this time  RADIOGRAPHIC STUDIES: I have personally reviewed the radiological images as listed and agreed with the findings in the report. No results found.  I spent 20 minutes in the care of this patient including history, physical exam, review of records, counseling and coordination of care All questions were answered. The patient knows to call the clinic with any problems, questions or concerns.   Benay Pike, MD 02/01/2022 2:05 PM

## 2022-06-18 ENCOUNTER — Other Ambulatory Visit: Payer: Self-pay | Admitting: Gastroenterology

## 2022-06-18 ENCOUNTER — Encounter: Payer: Self-pay | Admitting: Hematology and Oncology

## 2022-07-10 ENCOUNTER — Encounter: Payer: Self-pay | Admitting: Gastroenterology

## 2022-07-10 ENCOUNTER — Ambulatory Visit (INDEPENDENT_AMBULATORY_CARE_PROVIDER_SITE_OTHER): Payer: 59 | Admitting: Gastroenterology

## 2022-07-10 VITALS — BP 110/80 | HR 71 | Ht 62.0 in | Wt 122.0 lb

## 2022-07-10 DIAGNOSIS — K219 Gastro-esophageal reflux disease without esophagitis: Secondary | ICD-10-CM | POA: Diagnosis not present

## 2022-07-10 DIAGNOSIS — Z8719 Personal history of other diseases of the digestive system: Secondary | ICD-10-CM

## 2022-07-10 MED ORDER — OMEPRAZOLE 40 MG PO CPDR: 40.0000 mg | DELAYED_RELEASE_CAPSULE | Freq: Two times a day (BID) | ORAL | 3 refills | Status: DC

## 2022-07-10 NOTE — Patient Instructions (Addendum)
We have sent the following medications to your pharmacy for you to pick up at your convenience: Omeprazole  Please follow up in a year.  _______________________________________________________  If your blood pressure at your visit was 140/90 or greater, please contact your primary care physician to follow up on this.  _______________________________________________________  If you are age 54 or older, your body mass index should be between 23-30. Your Body mass index is 22.31 kg/m. If this is out of the aforementioned range listed, please consider follow up with your Primary Care Provider.  If you are age 54 or younger, your body mass index should be between 19-25. Your Body mass index is 22.31 kg/m. If this is out of the aformentioned range listed, please consider follow up with your Primary Care Provider.   ________________________________________________________  The [facility] providers would like to encourage you to use [facility] to communicate with providers for non-urgent requests or questions.  Due to long hold times on the telephone, sending your provider a message by [facility] may be a faster and more efficient way to get a response.  Please allow 48 business hours for a response.  Please remember that this is for non-urgent requests.  _______________________________________________________ Thank you for trusting me with your gastrointestinal care!   Lucio Edward MD

## 2022-07-10 NOTE — Progress Notes (Signed)
    Assessment     GERD with history of esophageal stricture History of erosive gastritis CRC screening, average risk, up-to-date   Recommendations    Omeprazole 40 mg po bid for now, attempt to reduce to 40 mg qd, follow antireflux measures Famotidine 20 mg po bid prn Screening colonoscopy December 2031 REV in 1 year   HPI    This is a 52 year old female with a history of GERD and esophageal stricture returning for follow-up.  She states her reflux symptoms are under very good control.  She has substantially modified her diet over the past several months.  She rarely needs famotidine for breakthrough symptoms.  She denies dysphagia and odynophagia.   Colonoscopy Dec 2021 Normal to TI  EGD Oct 2018  Path: mild chronic gastritis, reflux changes   Labs / Imaging       Latest Ref Rng & Units 01/31/2021    9:19 AM 02/22/2020    3:07 PM 11/04/2019    9:12 PM  Hepatic Function  Total Protein 6.5 - 8.1 g/dL 6.2  7.9  6.6   Albumin 3.5 - 5.0 g/dL 3.0  4.4  3.4   AST 15 - 41 U/L 20  16  20   $ ALT 0 - 44 U/L 15  13  15   $ Alk Phosphatase 38 - 126 U/L 63  61  61   Total Bilirubin 0.3 - 1.2 mg/dL 0.3  0.3  0.4        Latest Ref Rng & Units 02/01/2022   12:36 PM 08/02/2021    8:43 AM 01/31/2021    9:19 AM  CBC  WBC 4.0 - 10.5 K/uL 7.9  6.7  5.2   Hemoglobin 12.0 - 15.0 g/dL 13.8  13.8  11.4   Hematocrit 36.0 - 46.0 % 38.1  38.5  33.4   Platelets 150 - 400 K/uL 265  243  273     Current Medications, Allergies, Past Medical History, Past Surgical History, Family History and Social History were reviewed in [employer] record.   Physical Exam: General: Well developed, well nourished, no acute distress Head: Normocephalic and atraumatic Eyes: Sclerae anicteric, EOMI Ears: Normal auditory acuity Mouth: No deformities or lesions noted Lungs: Clear throughout to auscultation Heart: Regular rate and rhythm; No murmurs, rubs or bruits Abdomen: Soft, non  tender and non distended. No masses, hepatosplenomegaly or hernias noted. Normal Bowel sounds Rectal: Musculoskeletal: Symmetrical with no gross deformities  Pulses:  Normal pulses noted Extremities: No edema or deformities noted Neurological: Alert oriented x 4, grossly nonfocal Psychological:  Alert and cooperative. Normal mood and affect   Yuki Brunsman T. Fuller Plan, MD 07/10/2022, 9:25 AM

## 2023-04-01 ENCOUNTER — Other Ambulatory Visit: Payer: Self-pay

## 2023-04-01 MED ORDER — OMEPRAZOLE 40 MG PO CPDR: 40.0000 mg | DELAYED_RELEASE_CAPSULE | Freq: Two times a day (BID) | ORAL | 1 refills | Status: DC

## 2023-06-13 ENCOUNTER — Encounter: Payer: Self-pay | Admitting: Hematology and Oncology

## 2023-06-13 ENCOUNTER — Other Ambulatory Visit: Payer: Self-pay | Admitting: Obstetrics & Gynecology

## 2023-06-13 DIAGNOSIS — Z1231 Encounter for screening mammogram for malignant neoplasm of breast: Secondary | ICD-10-CM

## 2023-06-25 ENCOUNTER — Encounter: Payer: Self-pay | Admitting: Hematology and Oncology

## 2023-07-02 ENCOUNTER — Ambulatory Visit: Payer: Medicaid Other

## 2023-07-04 ENCOUNTER — Ambulatory Visit
Admission: RE | Admit: 2023-07-04 | Discharge: 2023-07-04 | Disposition: A | Discharge status: Home / Self Care | Payer: 59 | Source: Ambulatory Visit | Attending: Obstetrics & Gynecology | Admitting: Obstetrics & Gynecology

## 2023-07-04 DIAGNOSIS — Z1231 Encounter for screening mammogram for malignant neoplasm of breast: Secondary | ICD-10-CM

## 2024-05-18 ENCOUNTER — Telehealth: Payer: Self-pay | Admitting: Physician Assistant

## 2024-05-18 MED ORDER — OMEPRAZOLE 40 MG PO CPDR: 40.0000 mg | DELAYED_RELEASE_CAPSULE | Freq: Two times a day (BID) | ORAL | 0 refills | Status: AC

## 2024-05-18 MED ORDER — ONDANSETRON 4 MG PO TBDP: 4.0000 mg | ORAL_TABLET | Freq: Three times a day (TID) | ORAL | 1 refills | Status: AC | PRN

## 2024-05-18 NOTE — Telephone Encounter (Signed)
 Is it ok to refill her zofran  and Prilosec medication until her appointment date?

## 2024-05-18 NOTE — Telephone Encounter (Signed)
 Medication sent and patient was made aware to keep follow up appointment and she said she will  She does prilosec daily mainly 2 a day as needed

## 2024-05-18 NOTE — Telephone Encounter (Signed)
 Inbound call from patient stating she needs refill for Zofran  and Prilosec. She is a previous Dr. Aneita patient. Patient is scheduled to see Alan Coombs on 1/20 at 9:20. Requesting refill be sent. Please advise.

## 2024-06-07 NOTE — Progress Notes (Unsigned)
 "    06/08/2024 Connie Murray [id number] 09/04/1970  Referring provider: Samie Frederick, PA-C Primary GI doctor: Dr. Federico (Dr. 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 benign-appearing esophageal stenosis status post dilatation nonbleeding erosive gastropathy normal duodenum pathology showed mild chronic gastritis and reflux changes Rare NSAIDS, ETOH rare, has quit smoking PLAN: - omeprazole  40 mg controlling GERD, refilled -alginate therapy given -Lifestyle changes discussed, avoid NSAIDS, ETOH, hand out given to the patient  [facility] December 2021 unremarkable colonoscopy Recall December 2031  Fat-containing periumbilical hernia 03/02/2020 CTAP W for change in bowel habits status post hysterectomy fat-containing umbilical hernia otherwise unremarkable  Patient Care Team: Samie Frederick, PA-C as PCP - General (Physician Assistant)  HISTORY OF PRESENT ILLNESS: 54 y.o. female with a past medical history listed below presents for evaluation of GERD.   Last seen 07/10/2022 by Dr. Aneita for GERD.  Discussed the use of AI scribe software for clinical note transcription with the patient, who gave verbal consent to proceed.  History of Present Illness   Connie Murray is a 54 year old female with gastroesophageal reflux disease and prior esophageal stricture who presents for follow-up.  Longstanding reflux has been managed with omeprazole , currently at 40 mg once daily after previously taking it twice daily. Upper endoscopy in 2018 showed esophageal inflammation, and she underwent two esophageal dilations. H. pylori testing was negative. Dietary modifications include avoidance of processed foods and use of herbs and spices. She denies dysphagia, food or pill impaction, weight loss, nausea, vomiting, abdominal pain, melena,  hematochezia, constipation, and diarrhea.  Hernia repair was performed in October 2020, followed by a single episode of pancreatitis that resolved without sequelae.  Musculoskeletal pain is managed with prescription diclofenac as needed and topical diclofenac cream. Previous pain management spanned twenty years; current strategies include exercise, yoga, and meditation.  She quit smoking in 2020 and does not use tobacco. Alcohol is consumed but not daily; a period of increased use occurred three years ago after a family loss, with subsequent counseling and regular laboratory monitoring. Family history includes alcoholism and bipolar disorder, and she is familiar with therapy.      She  reports that she quit smoking about 6 years ago. Her smoking use included cigarettes. She started smoking about 41 years ago. She has a 35.4 pack-year smoking history. She has never used smokeless tobacco. She reports current alcohol use. She reports that she does not use drugs.  RELEVANT GI HISTORY, IMAGING AND LABS: Results   Diagnostic Esophagogastroduodenoscopy (2018): Esophageal inflammation; negative for Helicobacter pylori; esophageal dilation performed      CBC    Component Value Date/Time   WBC 7.9 02/01/2022 1236   RBC 4.12 02/01/2022 1236   HGB 13.8 02/01/2022 1236   HCT 38.1 02/01/2022 1236   PLT 265 02/01/2022 1236   MCV 92.5 02/01/2022 1236   MCV 99.3 (A) 12/12/2012 1137   MCH 33.5 02/01/2022 1236   MCHC 36.2 (H) 02/01/2022 1236   RDW 11.6 02/01/2022 1236   LYMPHSABS 2.2 02/01/2022 1236   MONOABS 0.7 02/01/2022 1236   EOSABS 0.1 02/01/2022 1236   BASOSABS 0.1 02/01/2022 1236   No results for input(s): HGB in the last 8760 hours.  CMP     Component Value Date/Time   NA 140 01/31/2021 0919   K 3.8 01/31/2021 0919   CL 104 01/31/2021  0919   CO2 26 01/31/2021 0919   GLUCOSE 92 01/31/2021 0919   BUN 8 01/31/2021 0919   CREATININE 0.80 01/31/2021 0919   CREATININE 1.41 (H)  03/09/2013 1444   CALCIUM 8.6 (L) 01/31/2021 0919   PROT 6.2 (L) 01/31/2021 0919   ALBUMIN 3.0 (L) 01/31/2021 0919   AST 20 01/31/2021 0919   ALT 15 01/31/2021 0919   ALKPHOS 63 01/31/2021 0919   BILITOT 0.3 01/31/2021 0919   GFRNONAA >60 01/31/2021 0919   GFRAA >60 11/04/2019 2112      Latest Ref Rng & Units 01/31/2021    9:19 AM 02/22/2020    3:07 PM 11/04/2019    9:12 PM  Hepatic Function  Total Protein 6.5 - 8.1 g/dL 6.2  7.9  6.6   Albumin 3.5 - 5.0 g/dL 3.0  4.4  3.4   AST 15 - 41 U/L 20  16  20    ALT 0 - 44 U/L 15  13  15    Alk Phosphatase 38 - 126 U/L 63  61  61   Total Bilirubin 0.3 - 1.2 mg/dL 0.3  0.3  0.4       Current Medications:   Current Outpatient Medications (Endocrine & Metabolic):    estradiol  (ESTRACE ) 2 MG tablet, Take 2 mg by mouth daily.   testosterone cypionate (DEPOTESTOTERONE CYPIONATE) 100 MG/ML injection, Inject 100 mg into the muscle every 8 (eight) weeks.  Current Outpatient Medications (Cardiovascular):    cloNIDine  (CATAPRES ) 0.2 MG tablet, Take 0.2 mg by mouth 2 (two) times daily.   EPINEPHrine  (EPIPEN ) 0.3 mg/0.3 mL SOAJ, Inject 0.3 mLs (0.3 mg total) into the muscle once.  Current Outpatient Medications (Respiratory):    budesonide -formoterol  (SYMBICORT ) 80-4.5 MCG/ACT inhaler, Symbicort  80 mcg-4.5 mcg/actuation HFA aerosol inhaler  INHALE 2 PUFFS INTO THE LUNGS TWICE DAILY   fexofenadine (ALLEGRA) 180 MG tablet, Take 180 mg by mouth daily.  Current Outpatient Medications (Analgesics):    diclofenac (VOLTAREN) 75 MG EC tablet, Take 75 mg by mouth 2 (two) times daily.   Erenumab-aooe 70 MG/ML SOAJ, Inject 140 mg into the skin every 30 (thirty) days.   SUMAtriptan  (TOSYMRA ) 10 MG/ACT SOLN, Tosymra  10 mg/actuation nasal spray  USE 1 SPRAY NASALLY AS NEEDED FOR 30 DAYS  Current Outpatient Medications (Other):    ALPRAZolam  (XANAX ) 1 MG tablet, Take 1 mg by mouth 2 (two) times daily as needed for anxiety.   B Complex-C (B-COMPLEX WITH VITAMIN  C) tablet, Take 2 tablets by mouth daily.    Bacillus Coagulans-Inulin (PROBIOTIC) 1-250 BILLION-MG CAPS, Take by mouth.   CALCIUM MAGNESIUM ZINC PO, Take 1 Capful by mouth daily.   Cholecalciferol  (VITAMIN D  PO), Take 1,000 Units by mouth daily.    diclofenac Sodium (VOLTAREN) 1 % GEL, diclofenac 1 % topical gel  APPLY TOPICALLY TO THE AFFECTED AREA FOUR TIMES DAILY AS NEEDED   famotidine  (PEPCID ) 20 MG tablet, Take 1 tablet (20 mg total) by mouth daily.   Melatonin 10 MG CAPS, Take by mouth.   Multiple Vitamins-Minerals (ONE DAILY MENS 50+ MULTIVIT PO), Take 1 tablet by mouth daily.   omeprazole  (PRILOSEC) 40 MG capsule, Take 1 capsule (40 mg total) by mouth 2 (two) times daily.   OnabotulinumtoxinA, Cosmetic, (BOTOX COSMETIC IM), Inject into the muscle every 3 (three) months.   ondansetron  (ZOFRAN  ODT) 4 MG disintegrating tablet, Take 1 tablet (4 mg total) by mouth 3 (three) times daily as needed for nausea or vomiting (and vomiting).   TURMERIC PO, Take  1 Capful by mouth daily.   vitamin C (ASCORBIC ACID) 500 MG tablet, Take 500 mg by mouth 2 (two) times daily.  Medical History:  Past Medical History:  Diagnosis Date   Allergy    Anxiety    Asthma    stress related, perfumes, dyes and certain flowers    Bipolar disorder (HCC)    Breast mass, right 12/30/2016   Cataract    removed with lens implants bilat    Cervical spondylosis    Chronic headaches    Chronic lower back pain    Degenerative arthritis    Depression    Dyslipidemia    Fibromyalgia    GERD (gastroesophageal reflux disease)    Heart murmur    cildhood heart murmur   Hyperlipidemia    Hypertension    Migraine    Myalgia and myositis, unspecified 11/27/2012   Normocytic anemia    Palpitations    Pneumonia    Radiculopathy    C8 or T1   Allergies: Allergies[1]   Surgical History:  She  has a past surgical history that includes Total abdominal hysterectomy w/ bilateral salpingoophorectomy (01/2010); Nasal  sinus surgery; Breast biopsy (Right, 01/19/2016); Breast biopsy (Right, 01/19/2016); Breast lumpectomy with radioactive seed localization (Right, 12/30/2016); Broken Foot (Right); dental; Dental restoration/extraction with x-ray; Eye surgery; Abdominal hysterectomy; Ventral hernia repair (N/A, 03/19/2019); Colonoscopy; and Upper gastrointestinal endoscopy. Family History:  Her family history includes Alcoholism in her father; Bone cancer in her maternal uncle; Brain cancer in her maternal uncle; Breast cancer in her paternal grandmother; Colon polyps in her mother; Irritable bowel syndrome in an other family member; Ovarian cancer in her mother.  REVIEW OF SYSTEMS  : All other systems reviewed and negative except where noted in the History of Present Illness.  PHYSICAL EXAM: BP 106/64   Pulse 67   Ht 5' 2 (1.575 m)   Wt 132 lb (59.9 kg)   BMI 24.14 kg/m  Physical Exam   GENERAL APPEARANCE: Well nourished, in no apparent distress HEENT: No cervical lymphadenopathy, unremarkable thyroid , sclerae anicteric, conjunctiva pink RESPIRATORY: Respiratory effort normal, BS equal bilateral without rales, rhonchi, wheezing CARDIO: RRR with no MRGs, peripheral pulses intact ABDOMEN: Soft, non distended, active bowel sounds in all 4 quadrants, no tenderness to palpation, no rebound, no mass appreciated RECTAL: declines MUSCULOSKELETAL: Full ROM, normal gait, without edema SKIN: Dry, intact without rashes or lesions. No jaundice. NEURO: Alert, oriented, no focal deficits PSYCH: Cooperative, normal mood and affect.      Alan JONELLE Coombs, PA-C 10:39 AM      [1]  Allergies Allergen Reactions   Bee Venom Anaphylaxis   Cephalexin Swelling   Chlorhexidine  Swelling   Clindamycin/Lincomycin Anaphylaxis   Molds & Smuts Shortness Of Breath   Nsaids Other (See Comments)    Affects kidney and liver   Statins Other (See Comments)    Caused liver problems   Acetaminophen  Other (See Comments)     Affects whole body; makes me feel like crap   Codeine Itching    And a really, really bad headache    Darvocet [Propoxyphene N-Acetaminophen ] Nausea And Vomiting   Latex Dermatitis and Rash    Occurs mostly only in groin area.   Red Dye #40 (Allura Red) Itching and Rash    Red streaks across throat, some itching Causes throat to become red and raw   Tetracyclines & Related Nausea And Vomiting    and severe stomach cramps   "

## 2024-06-08 ENCOUNTER — Encounter: Payer: Self-pay | Admitting: Physician Assistant

## 2024-06-08 ENCOUNTER — Ambulatory Visit: Admitting: Physician Assistant

## 2024-06-08 VITALS — BP 106/64 | HR 67 | Ht 62.0 in | Wt 132.0 lb

## 2024-06-08 DIAGNOSIS — K429 Umbilical hernia without obstruction or gangrene: Secondary | ICD-10-CM | POA: Diagnosis not present

## 2024-06-08 DIAGNOSIS — K219 Gastro-esophageal reflux disease without esophagitis: Secondary | ICD-10-CM

## 2024-06-08 DIAGNOSIS — Z8719 Personal history of other diseases of the digestive system: Secondary | ICD-10-CM | POA: Diagnosis not present

## 2024-06-08 NOTE — Patient Instructions (Addendum)
 Please take your proton pump inhibitor medication, omeprazole  40 mg  Please take this medication 30 minutes to 1 hour before meals- this makes it more effective.  Avoid spicy and acidic foods Avoid fatty foods Limit your intake of coffee, tea, alcohol, and carbonated drinks Work to maintain a healthy weight Keep the head of the bed elevated at least 3 inches with blocks or a wedge pillow if you are having any nighttime symptoms Stay upright for 2 hours after eating Avoid meals and snacks three to four hours before bedtime  Congratulations on quitting smoking  Reflux Gourmet Rescue  It is an ALGINATE THERAPY which is the only intervention that works to safeguard the esophagus by creating a protective barrier that actually stops reflux from happening. -The general directions for use are as stated on the packaging: Take 1 teaspoon (5 ml), or more as needed or as directed by your physician, after meals and before bed. -These general directions address the most common times for reflux to occur, but our Rescue products may be taken anytime. Some individuals may take our product preemptively, when they know they will suffer from reflux, or as needed - when discomfort arises. (If taken around food, it should be consumed last.) -You do not have to take 1 teaspoon (5 ml) of the product. While one teaspoon (5ml) may be the perfect average amount to relieve reflux suffering in some, others may require more or less. You may adjust the amount of Mint Chocolate Rescue and Vanilla Caramel Rescue to the lowest amount necessary to meet your individual needs to improve your quality of life. -You may dilute the product if it is too viscous for you to consume. Keep in mind, however, that the thickness of the product was formulated to provide optimal coating and protection of your throat and esophagus. Though diluting the product is possible, it may reduce the protective function and/or length of action. -This can be  used in conjunction with reflux medications and lifestyle changes.  100% ALL-NATURAL  Paraben FREE, glycerin FREE, & potassium FREE  Made entirely from all-natural ingredients considered safe for children and during pregnancy  No known side effects  All-natural flavor Gluten FREE  Allergen FREE  Vegan  Can find more information here: [email or web address]
